# Patient Record
Sex: Female | Born: 1950
Health system: Southern US, Community
[De-identification: ages and names within clinical notes are randomized; demographics above are authoritative.]

## PROBLEM LIST (undated history)

## (undated) DIAGNOSIS — F40240 Claustrophobia: Secondary | ICD-10-CM

## (undated) DIAGNOSIS — R945 Abnormal results of liver function studies: Secondary | ICD-10-CM

## (undated) DIAGNOSIS — C801 Malignant (primary) neoplasm, unspecified: Secondary | ICD-10-CM

## (undated) DIAGNOSIS — K2970 Gastritis, unspecified, without bleeding: Secondary | ICD-10-CM

## (undated) DIAGNOSIS — M8580 Other specified disorders of bone density and structure, unspecified site: Secondary | ICD-10-CM

## (undated) DIAGNOSIS — K5732 Diverticulitis of large intestine without perforation or abscess without bleeding: Secondary | ICD-10-CM

## (undated) DIAGNOSIS — K449 Diaphragmatic hernia without obstruction or gangrene: Secondary | ICD-10-CM

## (undated) DIAGNOSIS — R3915 Urgency of urination: Secondary | ICD-10-CM

## (undated) DIAGNOSIS — K219 Gastro-esophageal reflux disease without esophagitis: Secondary | ICD-10-CM

## (undated) DIAGNOSIS — D494 Neoplasm of unspecified behavior of bladder: Secondary | ICD-10-CM

## (undated) HISTORY — DX: Neoplasm of unspecified behavior of bladder: D49.4

## (undated) HISTORY — PX: ABDOMINAL HYSTERECTOMY: SHX81

## (undated) HISTORY — DX: Diaphragmatic hernia without obstruction or gangrene: K44.9

## (undated) HISTORY — DX: Malignant (primary) neoplasm, unspecified: C80.1

## (undated) HISTORY — DX: Diverticulitis of large intestine without perforation or abscess without bleeding: K57.32

## (undated) HISTORY — DX: Gastritis, unspecified, without bleeding: K29.70

## (undated) HISTORY — DX: Other specified disorders of bone density and structure, unspecified site: M85.80

## (undated) HISTORY — PX: ANTERIOR CRUCIATE LIGAMENT REPAIR: SHX115

## (undated) HISTORY — DX: Abnormal results of liver function studies: R94.5

---

## 1990-05-17 HISTORY — PX: BLADDER TUMOR EXCISION: SHX238

## 1998-03-31 ENCOUNTER — Ambulatory Visit (HOSPITAL_COMMUNITY): Admission: RE | Admit: 1998-03-31 | Discharge: 1998-03-31 | Payer: Self-pay | Admitting: Obstetrics and Gynecology

## 1998-04-02 ENCOUNTER — Other Ambulatory Visit: Admission: RE | Admit: 1998-04-02 | Discharge: 1998-04-02 | Payer: Self-pay | Admitting: Obstetrics and Gynecology

## 1999-12-03 ENCOUNTER — Other Ambulatory Visit: Admission: RE | Admit: 1999-12-03 | Discharge: 1999-12-03 | Payer: Self-pay | Admitting: *Deleted

## 1999-12-08 ENCOUNTER — Encounter: Payer: Self-pay | Admitting: Obstetrics and Gynecology

## 1999-12-08 ENCOUNTER — Encounter: Admission: RE | Admit: 1999-12-08 | Discharge: 1999-12-08 | Payer: Self-pay | Admitting: Obstetrics and Gynecology

## 2000-12-06 ENCOUNTER — Other Ambulatory Visit: Admission: RE | Admit: 2000-12-06 | Discharge: 2000-12-06 | Payer: Self-pay | Admitting: *Deleted

## 2000-12-09 ENCOUNTER — Encounter: Payer: Self-pay | Admitting: Obstetrics and Gynecology

## 2000-12-09 ENCOUNTER — Encounter: Admission: RE | Admit: 2000-12-09 | Discharge: 2000-12-09 | Payer: Self-pay | Admitting: Physical Therapy

## 2002-03-08 ENCOUNTER — Other Ambulatory Visit: Admission: RE | Admit: 2002-03-08 | Discharge: 2002-03-08 | Payer: Self-pay | Admitting: Obstetrics and Gynecology

## 2002-03-29 ENCOUNTER — Encounter: Payer: Self-pay | Admitting: Obstetrics and Gynecology

## 2002-03-29 ENCOUNTER — Encounter: Admission: RE | Admit: 2002-03-29 | Discharge: 2002-03-29 | Payer: Self-pay | Admitting: Obstetrics and Gynecology

## 2003-03-14 ENCOUNTER — Other Ambulatory Visit: Admission: RE | Admit: 2003-03-14 | Discharge: 2003-03-14 | Payer: Self-pay | Admitting: Obstetrics and Gynecology

## 2003-04-03 ENCOUNTER — Encounter: Admission: RE | Admit: 2003-04-03 | Discharge: 2003-04-03 | Payer: Self-pay | Admitting: Obstetrics and Gynecology

## 2004-04-14 ENCOUNTER — Encounter: Admission: RE | Admit: 2004-04-14 | Discharge: 2004-04-14 | Payer: Self-pay | Admitting: Obstetrics and Gynecology

## 2004-06-15 ENCOUNTER — Encounter (INDEPENDENT_AMBULATORY_CARE_PROVIDER_SITE_OTHER): Payer: Self-pay | Admitting: *Deleted

## 2004-06-15 ENCOUNTER — Inpatient Hospital Stay (HOSPITAL_COMMUNITY): Admission: RE | Admit: 2004-06-15 | Discharge: 2004-06-17 | Payer: Self-pay | Admitting: Obstetrics and Gynecology

## 2004-06-15 HISTORY — PX: TOTAL ABDOMINAL HYSTERECTOMY W/ BILATERAL SALPINGOOPHORECTOMY: SHX83

## 2005-08-02 ENCOUNTER — Other Ambulatory Visit: Admission: RE | Admit: 2005-08-02 | Discharge: 2005-08-02 | Payer: Self-pay | Admitting: Obstetrics & Gynecology

## 2005-08-24 LAB — HM COLONOSCOPY

## 2005-12-25 ENCOUNTER — Emergency Department (HOSPITAL_COMMUNITY): Admission: EM | Admit: 2005-12-25 | Discharge: 2005-12-25 | Payer: Self-pay | Admitting: Emergency Medicine

## 2006-08-29 ENCOUNTER — Encounter: Admission: RE | Admit: 2006-08-29 | Discharge: 2006-08-29 | Payer: Self-pay | Admitting: Obstetrics and Gynecology

## 2007-11-14 ENCOUNTER — Encounter: Admission: RE | Admit: 2007-11-14 | Discharge: 2007-11-14 | Payer: Self-pay | Admitting: Obstetrics and Gynecology

## 2008-09-25 ENCOUNTER — Ambulatory Visit (HOSPITAL_BASED_OUTPATIENT_CLINIC_OR_DEPARTMENT_OTHER): Admission: RE | Admit: 2008-09-25 | Discharge: 2008-09-26 | Payer: Self-pay | Admitting: Orthopedic Surgery

## 2008-11-28 ENCOUNTER — Encounter: Admission: RE | Admit: 2008-11-28 | Discharge: 2008-11-28 | Payer: Self-pay | Admitting: Obstetrics and Gynecology

## 2010-09-29 NOTE — Op Note (Signed)
[name], [name]             ACCOUNT NO.:  [id number]   MEDICAL RECORD NO.:  [id number]          PATIENT TYPE:  AMB   LOCATION:  DSC                          FACILITY:  [facility]   PHYSICIAN:  Loreta Ave, M.D. DATE OF BIRTH:  12/08/1950   DATE OF PROCEDURE:  09/25/2008  DATE OF DISCHARGE:                               OPERATIVE REPORT   PREOPERATIVE DIAGNOSES:  Right knee anterior cruciate ligament tear with  anterolateral rotary instability.  Medial meniscus tear.   POSTOPERATIVE DIAGNOSES:  Right knee anterior cruciate ligament tear  with anterolateral rotary instability.  Medial meniscus tear.  Also an  acute osteochondral defect 1 x 1 cm in the middle of the weightbearing  dome of medial femoral condyle with 2 large osteochondral loose bodies  found posterolaterally.  Also, some grade 2 and mild grade 3  chondromalacia patellofemoral joint, both patella and trochlea.   PROCEDURE:  Right knee exam under anesthesia and arthroscopy.  Removal  of loose bodies.  Debridement and microfracturing of medial femoral  condyle.  Partial medial meniscectomy.  Arthroscopic and endoscopic ACL  reconstruction, patellar tendon allograft, bone-tendon-bone with  notchplasty and bioabsorbable screw fixation.   SURGEON:  Loreta Ave, MD   ASSISTANT:  Alyssa Kief, PA, present throughout the entire case and  necessary for timely completion of procedure.   ANESTHESIA:  General.   BLOOD LOSS:  Minimal.   TOURNIQUET TIME:  1 hour 10 minutes.   SPECIMENS:  None.   CULTURES:  None.   COMPLICATIONS:  None.   DRESSINGS:  Soft compressive with knee immobilizer.   PROCEDURE:  The patient was brought to the operating room and placed on  operating table in supine position.  After adequate anesthesia had been  obtained, right knee examined.  Positive Lachman, positive drawer,  positive pivot shift.  Posterior cruciate ligament, collaterals,  patellofemoral joint stable.  Tourniquet  applied.  Prepped and draped in  usual sterile fashion.  Exsanguinated with elevation and Esmarch.  Tourniquet inflated to 350 mmHg.  Three portals created, one  superolateral, one each medial and lateral parapatellar.  Inflow  catheter introduced.  The knee distended, arthroscope introduced and  knee inspected.  Good patellofemoral tracking.  Diffuse grade 2 and mild  grade 3 changes of patella and trochlea debrided.  No tethering.  Complete mid substance ACL tear debrided.  A V-shaped narrow notch  opened with notchplasty with shaver and bur.  Lateral meniscus and  lateral compartment normal.  Medial compartment had a full-thickness  osteochondral defect 1 x 1 cm right middle of the weightbearing dome in  full extension.  Debrided back to sharp margin.  The osteochondral  fragments from this, 2 large ones were found posterolaterally and milked  down the popliteal tendon sheath and behind lateral femoral condyle and  removed.  Numerous other small loose bodies removed.  Medial meniscus  had a previous partial medial meniscectomy, but there was a new large  flap tear in the middle third tearing into what was left of the  posterior third.  Taken out to stable rim, tapered into remaining  meniscus,  and release the anterior half.  The denuded condyle was then  treated with multiple microfracturing, reduction of intra-articular  pressure and some bleeding from that area confirmed.  Graft prepared for  10-mm tunnels, bone-tendon-bone with a patellar tendon allograft.  With  the arthroscope in place, an incision next to tibial tubercle.  Using  appropriate guides, tunnel created there onto the footprint of the ACL  on the tibia.  Femoral tunnel created with a femoral guide through the  tibial tunnel and off the back cortex femur.  Both tunnels were placed  through guidewire and then overdrilled with 10-mm reamer.  Tunnels  assessed and found be in good position.  Debris cleared throughout the   knee.  Tubing passer inserted across both tunnels and out through a stab  wound in anterolateral thigh.  Nitinol wire brought to medial portal and  out through the femoral tunnel.  Graft attached to the 2-pin passer,  pulled in across the knee seating the graft well in both tunnels.  Over  nitinol wire and with appropriate tensioning technique, the graft was  firmly fixed in both tunnels with bioabsorbable 9 x 25-mm bioabsorbable  screws.  At completion, excellent stable fixation of the graft above and  below.  Good clearance through full motion.  Negative Lachman, negative  drawer, and significant improvement in stability.  All nitinol wire and  sutures had been removed.  Wounds irrigated.  Closed with subcutaneous  and subcuticular Vicryl.  Portals all closed with nylon.  Sterile  compressive dressing applied.  Tourniquet deflated and removed.  Knee  immobilizer applied.  Anesthesia reversed.  Brought to the room.  Tolerated surgery well.  No complications.      Loreta Ave, M.D.  Electronically Signed     DFM/MEDQ  D:  09/25/2008  T:  09/26/2008  Job:  [id number]

## 2010-10-02 NOTE — Op Note (Signed)
[name], [name]             ACCOUNT NO.:  [id number]   MEDICAL RECORD NO.:  [id number]          PATIENT TYPE:  INP   LOCATION:  9399                          FACILITY:  [facility]   PHYSICIAN:  Cynthia P. Romine, M.D.DATE OF BIRTH:  04-06-1951   DATE OF PROCEDURE:  06/15/2004  DATE OF DISCHARGE:                                 OPERATIVE REPORT   PREOPERATIVE DIAGNOSIS:  Dysfunctional uterine bleeding, known endometrial  polyp and 6 cm left ovarian mass, presumed dermoid.   POSTOPERATIVE DIAGNOSIS:  Dysfunctional uterine bleeding, known endometrial  polyp and 6 cm left ovarian mass, presumed dermoid.  Pathology pending.  Frozen section on left ovarian mass confirmed dermoid.   OPERATION PERFORMED:  Total abdominal hysterectomy and bilateral salpingo-  oophorectomy.   SURGEON:  Cynthia P. Romine, M.D.   ASSISTANT:  Andres Ege, M.D.   ANESTHESIA:  General endotracheal.   ESTIMATED BLOOD LOSS:  100 mL.   COMPLICATIONS:  None.   DESCRIPTION OF PROCEDURE:  The patient was taken to the operating room and  after induction of adequate general endotracheal anesthesia, was placed in  supine frog leg position and prepped and draped in the usual fashion.  A  Foley catheter was inserted.  The patient was then placed supine. A  Pfannenstiel incision was made and carried down to the fascia with the  Bovie.  The fascia was nicked and opened transversely.  The underlying  rectus muscles were separated sharply in the midline.  Underlying peritoneum  was entered atraumatically, opened vertically with Metzenbaum scissors.  Palpation of the abdomen revealed the liver edge to be smooth.  The  gallbladder was tense but contained no stones.  There was no pelvic or  periaortic adenopathy.  In the pelvis, the uterus was small and normal  shape.  The right tube and ovary appeared normal.  The left tube appeared  normal but on the left ovary, it was enlarged to 6 cm with a smooth cyst.  The  round ligament on either side were sutured and divided with Bovie.  The  anterior leaf of the broad ligament was taken down.  A window was made in  the mesosalpinx and the pedicle containing the infundibulopelvic ligament on  each side was clamped, cut and tied bilaterally after identifying the  ureter.  The pedicle containing the tube and the utero-ovarian ligament was  clamped and cut on the left and the specimen was sent to pathology for  frozen section.  Frozen section diagnosis came back showing an ovarian  mature cystic teratoma. The posterior leaf of the peritoneum was taken down  sharply.  The uterine arteries skeletonized.  The bladder was taken down  sharply.  The uterine arteries were clamped, cut and doubly tied on each  side.  The hysterectomy contained down the cardial ligament with straight  Heaney clamps, clamping, cutting and tying in sequence.  Uterosacral  ligaments were taken in separate pedicles using curved Heaneys.  These  pedicles were held.  The uterus was entered on the patient's right during  the cutting of the uterosacral ligament pedicle.  The specimen was  removed  with Mayo scissors.  The vaginal margins were grasped with Kochers, angle  sutures were placed at each of the right and left angles of the vagina.  The  vagina was closed with interrupted figure-of-eight sutures of 0 chromic.  Good hemostasis was noted.  There was an area of bleeding at the left angle  that was easily controlled with a single figure-of-eight suture of 0 chromic  at the left angle of the vagina.  A small amount of bleeding under the  bladder peritoneum that was controlled with minimal Bovie.  The pelvis was  inspected and was felt to be free of bleeding.  The bowel was allowed to  return to  its anatomic position.  The peritoneum was closed in running  fashion, using 2-0 chromic and On-Q catheter was placed in the subfascial  space and then the fascia was closed with 3-0 Vicryl going  from each angle  towards the midline.  An On-Q catheter was placed in the subcu space,  hemostasis was achieved with Bovie.  The subcu was irrigated and the skin  was closed with staples.  The subfascial space was then injected with 10 mL  of 1% Xylocaine.  The subcutaneous space was injected with 5 mL of 1%  Xylocaine and the procedure was terminated.  The patient tolerated the  procedure well, went in satisfactory condition to post anesthesia recovery.  Sponge, needle and instrument counts were correct times three.      CPR/MEDQ  D:  06/15/2004  T:  06/15/2004  Job:  [id number]

## 2010-10-02 NOTE — Discharge Summary (Signed)
[name], [name]             ACCOUNT NO.:  [id number]   MEDICAL RECORD NO.:  [id number]          PATIENT TYPE:  INP   LOCATION:  9304                          FACILITY:  [facility]   PHYSICIAN:  Cynthia P. Romine, M.D.DATE OF BIRTH:  February 06, 1951   DATE OF ADMISSION:  06/15/2004  DATE OF DISCHARGE:  06/17/2004                                 DISCHARGE SUMMARY   DISCHARGE DIAGNOSES:  1.  Endometrial polyp.  2.  Adenomyosis.  3.  Multiple leiomyoma.   HISTORY:  This is a 60 year old married white female, gravida 3, para 3 who  had dysfunctional uterine bleeding.  Endometrial biopsy had showed fragments  of benign polyps, sonohysterogram showed a 1.4 cm endometrial polyp and a  6.6 cm complex mass on the left ovary consistent with a dermoid. A CA 125  was 13.  She was admitted for a total abdominal hysterectomy and bilateral  salpingo-oophorectomy.  On June 15, 2004, she underwent a TAH, BSO  without difficulty.  The left tube was normal, the left ovary had a 6 cm  smooth cyst on it that was easily removed.  Final diagnosis showed a mature  cystic teratoma of 5.5 cm and a benign paratubal cyst.  Postoperatively, the  patient had an uneventful recovery and was sent home on postoperative day  #2, afebrile and in good condition. She was sent home for Vivelle dot 0.05  mg to use two times per week and Percocet 5 mg #15 one p.o. q.4 hours p.r.n.  pain. She was given full discharge instructions regarding pelvic rest and  followup in the office.   LABORATORY DATA:  Admission H&H was 14 and 41.  On discharge 11.5 and 33.  Pregnancy test was negative.      CPR/MEDQ  D:  07/09/2004  T:  07/09/2004  Job:  [id number]

## 2010-12-01 ENCOUNTER — Other Ambulatory Visit: Payer: Self-pay | Admitting: Obstetrics and Gynecology

## 2010-12-01 DIAGNOSIS — Z1231 Encounter for screening mammogram for malignant neoplasm of breast: Secondary | ICD-10-CM

## 2010-12-10 ENCOUNTER — Ambulatory Visit
Admission: RE | Admit: 2010-12-10 | Discharge: 2010-12-10 | Disposition: A | Discharge status: Home / Self Care | Payer: 59 | Source: Ambulatory Visit | Attending: Obstetrics and Gynecology | Admitting: Obstetrics and Gynecology

## 2010-12-10 DIAGNOSIS — Z1231 Encounter for screening mammogram for malignant neoplasm of breast: Secondary | ICD-10-CM

## 2012-01-11 ENCOUNTER — Other Ambulatory Visit: Payer: Self-pay | Admitting: Obstetrics and Gynecology

## 2012-01-11 DIAGNOSIS — Z1231 Encounter for screening mammogram for malignant neoplasm of breast: Secondary | ICD-10-CM

## 2012-01-13 ENCOUNTER — Ambulatory Visit
Admission: RE | Admit: 2012-01-13 | Discharge: 2012-01-13 | Disposition: A | Discharge status: Home / Self Care | Payer: 59 | Source: Ambulatory Visit | Attending: Obstetrics and Gynecology | Admitting: Obstetrics and Gynecology

## 2012-01-13 DIAGNOSIS — Z1231 Encounter for screening mammogram for malignant neoplasm of breast: Secondary | ICD-10-CM

## 2012-10-04 ENCOUNTER — Other Ambulatory Visit: Payer: Self-pay | Admitting: Dermatology

## 2012-11-14 ENCOUNTER — Other Ambulatory Visit: Payer: Self-pay | Admitting: Dermatology

## 2012-12-26 ENCOUNTER — Other Ambulatory Visit: Payer: Self-pay | Admitting: Nurse Practitioner

## 2012-12-27 NOTE — Telephone Encounter (Signed)
eScribe request for refill on MINIVELLE Last filled - 06/20/12 until next AEX Last AEX - 01/24/12 Next AEX - 01/19/13 One month supply sent until AEX.

## 2013-01-19 ENCOUNTER — Other Ambulatory Visit: Payer: Self-pay

## 2013-01-19 DIAGNOSIS — Z1231 Encounter for screening mammogram for malignant neoplasm of breast: Secondary | ICD-10-CM

## 2013-01-21 ENCOUNTER — Other Ambulatory Visit: Payer: Self-pay | Admitting: Nurse Practitioner

## 2013-01-29 ENCOUNTER — Ambulatory Visit: Payer: Self-pay | Admitting: Nurse Practitioner

## 2013-01-29 ENCOUNTER — Ambulatory Visit (INDEPENDENT_AMBULATORY_CARE_PROVIDER_SITE_OTHER): Payer: 59 | Admitting: Nurse Practitioner

## 2013-01-29 ENCOUNTER — Encounter: Payer: Self-pay | Admitting: Nurse Practitioner

## 2013-01-29 VITALS — BP 136/90 | HR 72 | Resp 16 | Ht 63.0 in | Wt 167.0 lb

## 2013-01-29 DIAGNOSIS — E559 Vitamin D deficiency, unspecified: Secondary | ICD-10-CM

## 2013-01-29 DIAGNOSIS — Z0000 Encounter for general adult medical examination without abnormal findings: Secondary | ICD-10-CM

## 2013-01-29 DIAGNOSIS — Z01419 Encounter for gynecological examination (general) (routine) without abnormal findings: Secondary | ICD-10-CM

## 2013-01-29 LAB — POCT URINALYSIS DIPSTICK
Blood, UA: NEGATIVE
Ketones, UA: NEGATIVE
Nitrite, UA: NEGATIVE
Urobilinogen, UA: NEGATIVE

## 2013-01-29 MED ORDER — ESTRADIOL 0.0375 MG/24HR TD PTTW: 1.0000 | MEDICATED_PATCH | TRANSDERMAL | Status: DC

## 2013-01-29 NOTE — Progress Notes (Signed)
Patient ID: [name], female   DOB: June 19, 1950, 62 y.o.   MRN: [id number] 62 y.o. [id number] Married Caucasian Fe here for annual exam.   feels well without any problems. Continues to play tennis.  No LMP recorded. Patient has had a hysterectomy.          Sexually active: yes  The current method of family planning is status post hysterectomy.    Exercising: yes  tennis and walking Smoker:  no  Health Maintenance: Pap:  08/02/05, WNL MMG:  01/13/12, BI-Rads 1: negative Colonoscopy:  08/24/05, normal, repeat in 10 years  BMD:   08/29/06 normal TDaP:  3/08 Labs: HB: 14.9 Urine: negative     Past Medical History  Diagnosis Date  . Benign bladder tumor 1992    Past Surgical History  Procedure Laterality Date  . Total abdominal hysterectomy w/ bilateral salpingoophorectomy  1/06    Current Outpatient Prescriptions  Medication Sig Dispense Refill  . MINIVELLE 0.05 MG/24HR patch APPLY ONE PATCH TWICE A WEEK  24 patch  0   No current facility-administered medications for this visit.    No family history on file.  ROS:  Pertinent items are noted in HPI.  Otherwise, a comprehensive ROS was negative.  Exam:   BP 136/90  Pulse 72  Resp 16  Ht 5\' 3"  (1.6 m)  Wt 167 lb (75.751 kg)  BMI 29.59 kg/m2 Height: 5\' 3"  (160 cm)  Ht Readings from Last 3 Encounters:  01/29/13 5\' 3"  (1.6 m)    General appearance: alert, cooperative and appears stated age Head: Normocephalic, without obvious abnormality, atraumatic Neck: no adenopathy, supple, symmetrical, trachea midline and thyroid normal to inspection and palpation Lungs: clear to auscultation bilaterally Breasts: normal appearance, no masses or tenderness Heart: regular rate and rhythm Abdomen: soft, non-tender; no masses,  no organomegaly Extremities: extremities normal, atraumatic, no cyanosis or edema Skin: Skin color, texture, turgor normal. No rashes or lesions Lymph nodes: Cervical, supraclavicular, and axillary nodes  normal. No abnormal inguinal nodes palpated Neurologic: Grossly normal   Pelvic: External genitalia:  no lesions              Urethra:  normal appearing urethra with no masses, tenderness or lesions              Bartholin's and Skene's: normal                 Vagina: normal appearing vagina with normal color and discharge, no lesions              Cervix: absent              Pap taken: no Bimanual Exam:  Uterus:  uterus absent              Adnexa: no mass, fullness, tenderness               Rectovaginal: Confirms               Anus:  normal sphincter tone, no lesions  A:  Well Woman with normal exam  S/P TAH / BSO 06/15/04 secondary to AUB and dermoid with ERT since then  Vit D deficiency  P:   Pap smear as per guidelines   Will try decrease in Minivelle patch if not tolerated to CB 0.375 mg 2 times weekly for a year.  Mammogram scheduled for next week  Discussed potential risk of ERT with DVT, CVA, cancer, etc  Will follow with Vit D level  may be able to go to OTC dosing, no refill is given today.  Counseled on breast self exam, adequate intake of calcium and vitamin D, diet and exercise, Kegel's exercises return annually or prn  An After Visit Summary was printed and given to the patient.

## 2013-01-29 NOTE — Patient Instructions (Signed)

## 2013-01-30 LAB — HEMOGLOBIN, FINGERSTICK: Hemoglobin, fingerstick: 14.9 g/dL (ref 12.0–16.0)

## 2013-01-31 ENCOUNTER — Telehealth: Payer: Self-pay | Admitting: *Deleted

## 2013-01-31 NOTE — Telephone Encounter (Signed)
Message copied by Luisa Dago on Wed Jan 31, 2013 10:27 AM ------      Message from: Ria Comment R      Created: Tue Jan 30, 2013  3:44 PM       She can now do OTC Vit D at 1000 IU daily.  I did not give refill on RX Vit D. ------

## 2013-01-31 NOTE — Telephone Encounter (Signed)
I have attempted to contact this patient by phone with the following results: left message to return my call on answering machine (home).  

## 2013-02-02 NOTE — Telephone Encounter (Signed)
Pt notified of labs

## 2013-02-04 NOTE — Progress Notes (Signed)
Encounter reviewed by Dr. Brook Silva.  

## 2013-02-08 MED ORDER — ESTRADIOL 0.0375 MG/24HR TD PTTW: 1.0000 | MEDICATED_PATCH | TRANSDERMAL | Status: DC

## 2013-02-08 NOTE — Addendum Note (Signed)
Addended by: Roanna Banning on: 02/08/2013 08:32 AM   Modules accepted: Orders

## 2013-02-09 ENCOUNTER — Ambulatory Visit
Admission: RE | Admit: 2013-02-09 | Discharge: 2013-02-09 | Disposition: A | Discharge status: Home / Self Care | Payer: 59 | Source: Ambulatory Visit

## 2013-02-09 DIAGNOSIS — Z1231 Encounter for screening mammogram for malignant neoplasm of breast: Secondary | ICD-10-CM

## 2013-03-21 ENCOUNTER — Ambulatory Visit
Admission: RE | Admit: 2013-03-21 | Discharge: 2013-03-21 | Disposition: A | Discharge status: Home / Self Care | Payer: 59 | Source: Ambulatory Visit | Attending: Nurse Practitioner | Admitting: Nurse Practitioner

## 2013-03-21 DIAGNOSIS — Z01419 Encounter for gynecological examination (general) (routine) without abnormal findings: Secondary | ICD-10-CM

## 2013-04-02 ENCOUNTER — Telehealth: Payer: Self-pay | Admitting: *Deleted

## 2013-04-02 NOTE — Telephone Encounter (Signed)
I have attempted to contact this patient by phone with the following results: left message to return my call on answering machine (home).  

## 2013-04-02 NOTE — Telephone Encounter (Signed)
Message copied by Luisa Dago on Mon Apr 02, 2013  3:40 PM ------      Message from: Ria Comment R      Created: Wed Mar 28, 2013  1:21 PM       Let patienn know that BMD shows no significant changes from 12/2007.  Still in the low normal range at left hip site.  She must do exercise, calcium and Vit D.  Recheck in 2 years. ------

## 2013-04-03 NOTE — Telephone Encounter (Signed)
Patient returning Stephanie's call.

## 2013-04-04 NOTE — Telephone Encounter (Signed)
Pt returned call on 04/03/13 when I was out of the office.  I have attempted to contact this patient by phone with the following results: left message to return my call on answering machine (home).

## 2013-04-04 NOTE — Telephone Encounter (Signed)
Pt notified of BMD results.  Pt voices understanding and is agreeable with plan.  Pt is interested in seeing Dr. Edward Jolly for bladder issues/urodynamics.  I was unsure of process for this.  Please advise.  Thanks.

## 2013-04-09 NOTE — Telephone Encounter (Signed)
Spoke with pt about PG advice to see Dr. Edward Jolly about bladder issues. Consult appt made for 04-27-13 at 11:30.

## 2013-04-09 NOTE — Telephone Encounter (Signed)
Please call and schedule consult appointment with Dr. Edward Jolly.  She then will decide if uro-dynamics is needed.

## 2013-04-09 NOTE — Telephone Encounter (Signed)
LMTCB about appt with Dr. Edward Jolly.  aa   (Was going to offer 04-19-13 at 7 am, or 04-27-13 at 11:30 am.)

## 2013-04-25 ENCOUNTER — Encounter: Payer: Self-pay | Admitting: *Deleted

## 2013-04-27 ENCOUNTER — Ambulatory Visit (INDEPENDENT_AMBULATORY_CARE_PROVIDER_SITE_OTHER): Payer: 59 | Admitting: Obstetrics and Gynecology

## 2013-04-27 ENCOUNTER — Encounter: Payer: Self-pay | Admitting: Obstetrics and Gynecology

## 2013-04-27 VITALS — BP 138/80 | HR 76 | Resp 16 | Wt 170.0 lb

## 2013-04-27 DIAGNOSIS — N993 Prolapse of vaginal vault after hysterectomy: Secondary | ICD-10-CM

## 2013-04-27 DIAGNOSIS — N816 Rectocele: Secondary | ICD-10-CM

## 2013-04-27 DIAGNOSIS — IMO0002 Reserved for concepts with insufficient information to code with codable children: Secondary | ICD-10-CM

## 2013-04-27 DIAGNOSIS — N8111 Cystocele, midline: Secondary | ICD-10-CM

## 2013-04-27 DIAGNOSIS — N3946 Mixed incontinence: Secondary | ICD-10-CM

## 2013-04-27 MED ORDER — FESOTERODINE FUMARATE ER 4 MG PO TB24: 4.0000 mg | ORAL_TABLET | Freq: Every day | ORAL | Status: DC

## 2013-04-27 NOTE — Patient Instructions (Addendum)
Please refer to your handouts on Pelvic Organ Prolapse and Urinary Incontinence.  Fesoterodine extended-release tablets What is this medicine? FESOTERODINE (fes oh TER oh deen) is used to treat overactive bladder. This medicine reduces the amount of bathroom visits. This medicine may be used for other purposes; ask your health care provider or pharmacist if you have questions. COMMON BRAND NAME(S): Gala Murdoch What should I tell my health care provider before I take this medicine? They need to know if you have any of these conditions: -difficulty passing urine -glaucoma -intestinal obstruction -kidney disease -liver disease -an unusual or allergic reaction to fesoterodine, tolterodine, other medicines, foods, dyes, or preservatives -pregnant or trying to get pregnant -breast-feeding How should I use this medicine? Take this medicine by mouth with a glass of water. Follow the directions on the prescription label. Do not cut, crush or chew this medicine. Take your doses at regular intervals. Do not take your medicine more often than directed. Talk to your pediatrician regarding the use of this medicine in children. Special care may be needed. Overdosage: If you think you have taken too much of this medicine contact a poison control center or emergency room at once. NOTE: This medicine is only for you. Do not share this medicine with others. What if I miss a dose? If you miss a dose, take it as soon as you can. If it is almost time for your next dose, take only that dose. Do not take double or extra doses. What may interact with this medicine? -antihistamines for allergy, cough and cold -atropine -certain medicines for bladder problems like oxybutynin, tolterodine -certain medicines for Parkinson's disease like benztropine, trihexyphenidyl -certain medicines for stomach problems like dicyclomine, hyoscyamine -certain medicines for travel sickness like  scopolamine -clarithromycin -ipratropium -itraconazole -ketoconazole -rifampin This list may not describe all possible interactions. Give your health care provider a list of all the medicines, herbs, non-prescription drugs, or dietary supplements you use. Also tell them if you smoke, drink alcohol, or use illegal drugs. Some items may interact with your medicine. What should I watch for while using this medicine? It may take 2 or 3 months to notice the full benefit from this medicine. Your health care professional may also recommend techniques that may help improve control of your bladder and sphincter muscles. These techniques will help you need the bathroom less frequently. You may need to limit your intake of tea, coffee, caffeinated sodas, and alcohol. These drinks may make your symptoms worse. Keeping healthy bowel habits may lessen bladder symptoms. If you currently smoke, quitting smoking may help reduce irritation to the bladder muscle. You may get drowsy or dizzy. Do not drive, use machinery, or do anything that needs mental alertness until you know how this drug affects you. Do not stand or sit up quickly, especially if you are an older patient. This reduces the risk of dizzy or fainting spells. Your mouth may get dry. Chewing sugarless gum or sucking hard candy and drinking plenty of water will help. This medicine may cause dry eyes and blurred vision. If you wear contact lenses you may feel some discomfort. Lubricating drops may help. See your eye doctor if the problem does not go away or is severe. What side effects may I notice from receiving this medicine? Side effects that you should report to your doctor or health care professional as soon as possible: -allergic reactions like skin rash, itching or hives, swelling of the face, lips, or tongue -breathing problems -chest pain -fast, irregular  heartbeat -fever -swelling of the ankles, feet, hands -trouble passing urine or change in  the amount of urine Side effects that usually do not require medical attention (report to your doctor or health care professional if they continue or are bothersome): -changes in vision -constipation -dizziness -dry eyes or mouth -nausea -stomach upset -tiredness This list may not describe all possible side effects. Call your doctor for medical advice about side effects. You may report side effects to FDA at 1-800-FDA-1088. Where should I keep my medicine? Keep out of the reach of children. Store at room temperature between 15 and 30 degrees C (59 and 86 degrees F). Protect from moisture. Throw away any unused medicine after the expiration date. NOTE: This sheet is a summary. It may not cover all possible information. If you have questions about this medicine, talk to your doctor, pharmacist, or health care provider.  2014, [employer] Standard. (2009-07-02 12:25:12)

## 2013-04-27 NOTE — Progress Notes (Signed)
Patient ID: [name], female   DOB: November 17, 1950, 62 y.o.   MRN: [id number]  GYNECOLOGY PROBLEM VISIT  PCP:   Referring provider:   HPI: 62 y.o.   Married  Caucasian  female   [phone] with No LMP recorded. Patient has had a hysterectomy.   here for bladder problems. Incontinence all the time. Wears a pad when does sports. Leakage of urine is what bothers the patient the most.  Leaks with bending and playing tennis and coughing.  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  DF - depends on water intake.  Can be up to every 20 - 30 minutes. NF - Twice a night.  No enuresis.  Voids well.  No dysuria or hematuria. Thinks she has a history of a bladder polyp removal many years ago. No history of stones.  No fecal incontinence. Presses vaginally occasionally to have bowel movements.  Has had physical therapy in past and it did not help. No medication use to date.  No history of glaucoma or cardiac arrhythmia.    GYNECOLOGIC HISTORY: No LMP recorded. Patient has had a hysterectomy. Sexually active:  Yes.  Partner preference: Female. Contraception:  NA Menopausal hormone therapy:  [place]. DES exposure:  No. Blood transfusions:   No. Sexually transmitted diseases:  No. GYN Procedures:  TAH/BSO in 2006 for a dermoid. Mammogram:  September 2014 - WNL.          Pap:   History of abnormal pap smear:  One in remote past.    OB History   Grav Para Term Preterm Abortions TAB SAB Ect Mult Living   3 3 3       3          Family History  Problem Relation Age of Onset  . Cancer Maternal Grandfather     There are no active problems to display for this patient.   Past Medical History  Diagnosis Date  . Bladder tumor 1992    BENIGN  . Fibroid     Past Surgical History  Procedure Laterality Date  . Anterior cruciate ligament repair Right   . Bladder tumor excision  1992    benign  . Total abdominal hysterectomy w/ bilateral salpingoophorectomy  06/15/04    dermoid and AUB  .  Abdominal hysterectomy      ALLERGIES: Sulfa antibiotics  Current Outpatient Prescriptions  Medication Sig Dispense Refill  . estradiol (MINIVELLE) 0.0375 MG/24HR Place 1 patch onto the skin 2 (two) times a week. Apply anywhere on lower abdomen.  24 patch  3  . Multiple Vitamin (MULTIVITAMIN) tablet Take 1 tablet by mouth daily.      . Vitamin D, Ergocalciferol, (DRISDOL) 50000 UNITS CAPS capsule Take 1 capsule by mouth every 14 (fourteen) days.       No current facility-administered medications for this visit.     ROS:  Pertinent items are noted in HPI.  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   PHYSICAL EXAMINATION:    BP 138/80  Pulse 76  Resp 16  Wt 170 lb (77.111 kg)   Wt Readings from Last 3 Encounters:  04/27/13 170 lb (77.111 kg)  01/29/13 167 lb (75.751 kg)     Ht Readings from Last 3 Encounters:  01/29/13 5\' 3"  (1.6 m)    General appearance: alert, cooperative and appears stated age Head: Normocephalic, without obvious abnormality, atraumatic Abdomen: Pfannenstiel incision, soft, non-tender; no masses,  no organomegaly. No abnormal inguinal nodes palpated Neurologic: Grossly normal  Pelvic: External genitalia:  no lesions              Urethra:  normal appearing urethra with no masses, tenderness or lesions              Bartholins and Skenes: normal                 Vagina: normal appearing vagina with normal color and discharge, no lesions, second degree cystocele and second degree rectocele, 1+ degree vault prolapse.               Cervix:  absent                Bimanual Exam:  Uterus:   absent                                      Adnexa: normal adnexa in size, nontender and no masses                                      Rectovaginal: Confirms                                      Anus:  normal sphincter tone, no lesions  ASSESSMENT  Incomplete vaginal prolapse with a component of vault prolapse. Mixed urinary  incontinence.  PLAN  I have had a comprehensive discussion with the patient regarding prolapse and urinary incontinence.  I have provided reading materials from ACOG regarding prolapse and incontinence in general as well as medical and surgical treatment for these conditions.   We discussed benefits and risks which include but are not limited to bleeding, infection, damage to surrounding organs, ureteral damage, vaginal pain with intercourse, mesh erosion and exposure, dyspareunia, urinary retention and need for prolonged catheterization and/or self catheterization, reoperation, recurrence of prolapse, peripheral neuropathy, hernia formation, DVT, PE, death, and reaction to anesthesia.    I am recommending patient consider an abdominal sacrocolpopexy with Halban's culdoplasty, anterior and posterior colporrhaphy, TVT Exact with cystoscopy.  I have also discussed a vaginal approach with an anterior and posterior colporrhaphy, vaginal vault suspension with possible xenform graft use, TVT Exact and cystoscopy.  She will consider surgery for next summer.  She understands that a pessary is an alternative to surgical care, but she declines this at this time.   Urodynamics will be performed closer to the time of surgery if she proceeds.    Toviaz for overactive bladder.  Benefits and risks discussed. Medications per Epic orders. Follow up in 6 weeks. Call if unable to void.    An After Visit Summary was printed and given to the patient.

## 2013-05-21 ENCOUNTER — Other Ambulatory Visit: Payer: Self-pay | Admitting: Family Medicine

## 2013-05-21 ENCOUNTER — Ambulatory Visit
Admission: RE | Admit: 2013-05-21 | Discharge: 2013-05-21 | Disposition: A | Discharge status: Home / Self Care | Payer: 59 | Source: Ambulatory Visit | Attending: Family Medicine | Admitting: Family Medicine

## 2013-05-21 DIAGNOSIS — R059 Cough, unspecified: Secondary | ICD-10-CM

## 2013-05-21 DIAGNOSIS — R509 Fever, unspecified: Secondary | ICD-10-CM

## 2013-05-21 DIAGNOSIS — R05 Cough: Secondary | ICD-10-CM

## 2013-06-01 ENCOUNTER — Other Ambulatory Visit: Payer: Self-pay | Admitting: Dermatology

## 2013-06-06 ENCOUNTER — Telehealth: Payer: Self-pay | Admitting: Obstetrics and Gynecology

## 2013-06-06 NOTE — Telephone Encounter (Signed)
Patient is not sure if she needs to keep her appointment on Friday 06/08/13. Patient would like to talk to a nurse.

## 2013-06-07 NOTE — Telephone Encounter (Signed)
Dr Quincy Simmonds,  Patient calling, states she has been sick with pneumonia and has not been taking Toviaz. She started taking it regularly on 1/19 now that she is feeling better. She wanted to know if she should keep follow up appointment for Friday or should she move it out further after she has been on toviaz for a longer period of time?

## 2013-06-07 NOTE — Telephone Encounter (Signed)
Please push the appointment out further. I recommend a recheck after the patient is on her medication for at least 6 weeks.

## 2013-06-08 ENCOUNTER — Ambulatory Visit: Payer: 59 | Admitting: Obstetrics and Gynecology

## 2013-06-08 NOTE — Telephone Encounter (Signed)
Appointment cancelled and r/s. Patient aware.

## 2013-07-13 ENCOUNTER — Encounter: Payer: Self-pay | Admitting: Obstetrics and Gynecology

## 2013-07-13 ENCOUNTER — Ambulatory Visit (INDEPENDENT_AMBULATORY_CARE_PROVIDER_SITE_OTHER): Payer: 59 | Admitting: Obstetrics and Gynecology

## 2013-07-13 VITALS — BP 140/86 | HR 64 | Ht 63.0 in | Wt 170.0 lb

## 2013-07-13 DIAGNOSIS — N3946 Mixed incontinence: Secondary | ICD-10-CM

## 2013-07-13 MED ORDER — FESOTERODINE FUMARATE ER 4 MG PO TB24: 4.0000 mg | ORAL_TABLET | Freq: Every day | ORAL | Status: DC

## 2013-07-13 NOTE — Progress Notes (Signed)
Patient ID: [name], female   DOB: [birth year], 63 y.o.   MRN: [id number] PCP - Donnie Coffin  Subjective  Patient is here for a recheck.  63 y.o.   Married  Caucasian  female    [phone] with No LMP recorded. Patient has had a TAH/BSO. here for bladder follow up.   Just had community acquired pneumonia and was treated with Z Pack. Leaked a lot from coughing and now wants to pursue surgery after wedding on June 20th.    Stopped Toviaz temporarily while was doing treatment for pneumonia. Back on second month now. Leaking without warning every once in while. Voiding well and less often. Up only once a night.    Dry mouth and constipation so drinking more water at night.     Thinks she has a history of a bladder polyp removal 1992. No history of stones.  No fecal incontinence. Presses vaginally occasionally to have bowel movements.    Has an appointment with PCP on March 20 for a routine visit.   Objective  No exam.  Assessment  Mixed incontinence.  Responding well to [place]. Incomplete vaginal prolapse. Status post TAH/BSO.   Plan  Continue Toviaz 4 mg daily.  #30.  RF 5.  Will schedule urodynamics.  Discussed with patient. She will stop Toviaz 5 days before testing.  She knows she may need a pessary for reduction of the prolapse for the urodynamics.   I will meet with the patient about one week after the testing to re-examine her, review the urodynamics, and develop a final surgical plan.   25 minutes face to face time of which over 50% was spent in counseling.   After visit summary to patient.

## 2013-07-13 NOTE — Patient Instructions (Signed)
We will call to schedule your urodynamic testing! Stop your Toviaz 5 days before this test.

## 2013-07-16 ENCOUNTER — Telehealth: Payer: Self-pay | Admitting: Obstetrics and Gynecology

## 2013-07-16 NOTE — Telephone Encounter (Signed)
Left message for patient to call back. Need to go over benefits and schedule urinalysis and urodynamics testing.

## 2013-07-18 NOTE — Telephone Encounter (Signed)
Left message for patient to call back  

## 2013-07-18 NOTE — Telephone Encounter (Signed)
Patient returned call. Reschedule urinalysis time on 03.12.2015.

## 2013-07-18 NOTE — Telephone Encounter (Signed)
Pt calling to change appointment scheduled for the urodynamics testing.

## 2013-07-26 ENCOUNTER — Ambulatory Visit (INDEPENDENT_AMBULATORY_CARE_PROVIDER_SITE_OTHER): Payer: 59 | Admitting: *Deleted

## 2013-07-26 ENCOUNTER — Other Ambulatory Visit: Payer: Self-pay | Admitting: Nurse Practitioner

## 2013-07-26 ENCOUNTER — Other Ambulatory Visit: Payer: 59

## 2013-07-26 VITALS — BP 136/86 | HR 75 | Resp 18 | Wt 171.0 lb

## 2013-07-26 DIAGNOSIS — N3946 Mixed incontinence: Secondary | ICD-10-CM

## 2013-07-26 LAB — POCT URINALYSIS DIPSTICK
Bilirubin, UA: NEGATIVE
Blood, UA: NEGATIVE
Glucose, UA: NEGATIVE
KETONES UA: NEGATIVE
Leukocytes, UA: NEGATIVE
Nitrite, UA: NEGATIVE
Protein, UA: NEGATIVE
UROBILINOGEN UA: NEGATIVE
pH, UA: 7

## 2013-07-26 NOTE — Progress Notes (Signed)
Patient in today to check UA for Urodynamics. Pt denies any sx of UTI. No burning, no urgency, no frequency, no discomfort.  -UA clear. No Urine Culture needed.

## 2013-07-27 NOTE — Progress Notes (Addendum)
Encounter and urinalysis reviewed. OK to proceed with urodynamics. Will need to stop Toviaz about 4 - 5 days prior to urodynamics.

## 2013-08-01 ENCOUNTER — Ambulatory Visit (INDEPENDENT_AMBULATORY_CARE_PROVIDER_SITE_OTHER): Payer: 59 | Admitting: Obstetrics and Gynecology

## 2013-08-01 VITALS — BP 140/86 | Wt 170.0 lb

## 2013-08-01 DIAGNOSIS — N3946 Mixed incontinence: Secondary | ICD-10-CM

## 2013-08-03 NOTE — Progress Notes (Signed)
Urodynamic testing performed, see separate procedure report.

## 2013-08-09 ENCOUNTER — Encounter: Payer: Self-pay | Admitting: Obstetrics and Gynecology

## 2013-08-09 ENCOUNTER — Ambulatory Visit (INDEPENDENT_AMBULATORY_CARE_PROVIDER_SITE_OTHER): Payer: 59 | Admitting: Obstetrics and Gynecology

## 2013-08-09 VITALS — BP 150/88 | HR 76 | Ht 63.0 in | Wt 170.0 lb

## 2013-08-09 DIAGNOSIS — N993 Prolapse of vaginal vault after hysterectomy: Secondary | ICD-10-CM

## 2013-08-09 DIAGNOSIS — N3946 Mixed incontinence: Secondary | ICD-10-CM

## 2013-08-09 NOTE — Progress Notes (Signed)
Encounter and urodynamics reports reviewed by Dr. Josefa Half.

## 2013-08-09 NOTE — Progress Notes (Signed)
Subjective  Patient is here to discuss urodynamic testing and potential surgery for prolapse and incontinence.  Stopped Lisbeth Ply due to recent pneumonia and then stopped again for urodynamic testing. Feels it is helping her.  Some dry mouth, but this is not troublesome for the patient.   Wants surgery for after June 20th, a wedding. Very active and plays tennis.   Objective  Uroflow testing - voided 88 cc and PVR none.  Continuous.  CMG - S1 107 cc, S2 181 cc, S3 298 cc. VLPP 93 cm H20. Stable CMG.  UPP - 27 cm H20 and 50 cm H20.  Pressure flow - Voided volume 349 cc.  Pdet Max 989 cm H20.  Pelvic exam  Third degree cystocele, a little more than first degree vaginal vault prolapse, second degree rectocele.   Absent cervix.  No midline nor adnexal masses or tenderness. Rectovaginal examination confirms the above.   Assessment  Incomplete vaginal prolapse. Mixed incontinence.  Some improvement with Toviaz.   Plan  I have had a comprehensive discussion with the patient regarding prolapse and urinary incontinence.  I have provided reading materials from ACOG regarding prolapse and incontinence in general as well as medical and surgical treatment for these conditions. Medical treatments may include physical therapy, pessary use, anticholinergic/antimuscarinic therapy.   We discussed multiple routes of approach to surgery including: - abdominal sacrocolpopexy with permanent graft with possible Burch, and anterior and posterior colporrhaphy. - vaginal approach with anterior and posterior colporrhaphy with possible sacrospinous fixation using native tissue repair or augmentation with bovine or porcine graft, and TVT midurethral sling and cystoscopy.   We discussed benefits and risks of surgery which include but are not limited to bleeding, infection, damage to surrounding organs, ureteral damage, vaginal pain with intercourse, mesh erosion and exposure, dyspareunia, urinary retention  and need for prolonged catheterization and/or self catheterization, reoperation, recurrence of prolapse and incontinence,  DVT, PE, death, and reaction to anesthesia.    I have discussed surgical expectations regarding the procedures and success rates, outcomes, and recovery.     Patient would like to pursue an abdominal sacrocolpopexy with possible Halban's culdoplasty, anterior and posterior colporrhaphy with TVT and cystoscopy.  We will schedule for this.   30 minutes face to face time of which over 50% was spent in counseling.   After visit summary to patient.

## 2013-08-14 ENCOUNTER — Telehealth: Payer: Self-pay | Admitting: Obstetrics and Gynecology

## 2013-08-14 NOTE — Telephone Encounter (Signed)
Advised patient of responsibility for surgeon fees $431.70. Explained that plan pays 80/20.  Advised patient that [name] will contact her to schedule. Patient prefers any time after June 20th.

## 2013-08-28 ENCOUNTER — Telehealth: Payer: Self-pay | Admitting: Obstetrics and Gynecology

## 2013-08-28 NOTE — Telephone Encounter (Signed)
Pt calling to schedule surgery.

## 2013-08-28 NOTE — Telephone Encounter (Signed)
Routing to Sally for surgery scheduling.   

## 2013-08-28 NOTE — Telephone Encounter (Signed)
Surgery scheduled for 11-06-13 based on patient's request for date after 11-03-13.  Call to patient. Reviewed surgery instruction sheet and will mail to patient.  Pre/post op appointments scheduled.    Patient reports a history of claustrophobia and extreme anxiety with previous surgery when oxygen mask placed over face.  She reports she needs anesthesia to wait until she is asleep before using.  Advised this is something she will need to discuss at PAT appointment.  I can let them know about this but will need to be addressed with anesthesia.  Will send message to PAT (pre anesthesia testing) VM.  Routing to provider for final review. Patient agreeable to disposition. Will close encounter

## 2013-08-30 ENCOUNTER — Telehealth: Payer: Self-pay | Admitting: *Deleted

## 2013-08-30 NOTE — Telephone Encounter (Signed)
Hassan Rowan notified of patients issues regarding oxygen mask and claustrophobia.  She will note this to discuss with anesthesia.  Routing to provider for final review. Patient agreeable to disposition. Will close encounter

## 2013-08-30 NOTE — Pre-Procedure Instructions (Signed)
Call rec'd from [place] office to make [place] aware pt had bad experience with last anesthesia-O2 mask made her feel claustrophobic and she became so aggitated case was canceled.

## 2013-09-16 ENCOUNTER — Emergency Department (INDEPENDENT_AMBULATORY_CARE_PROVIDER_SITE_OTHER)
Admission: EM | Admit: 2013-09-16 | Discharge: 2013-09-16 | Disposition: A | Discharge status: Home / Self Care | Payer: 59 | Source: Home / Self Care | Attending: Family Medicine | Admitting: Family Medicine

## 2013-09-16 ENCOUNTER — Encounter (HOSPITAL_COMMUNITY): Payer: Self-pay | Admitting: Emergency Medicine

## 2013-09-16 DIAGNOSIS — J069 Acute upper respiratory infection, unspecified: Secondary | ICD-10-CM

## 2013-09-16 DIAGNOSIS — R319 Hematuria, unspecified: Secondary | ICD-10-CM

## 2013-09-16 HISTORY — DX: Urgency of urination: R39.15

## 2013-09-16 LAB — POCT URINALYSIS DIP (DEVICE)
Bilirubin Urine: NEGATIVE
GLUCOSE, UA: NEGATIVE mg/dL
Ketones, ur: NEGATIVE mg/dL
Leukocytes, UA: NEGATIVE
NITRITE: NEGATIVE
PROTEIN: NEGATIVE mg/dL
SPECIFIC GRAVITY, URINE: 1.01 (ref 1.005–1.030)
UROBILINOGEN UA: 0.2 mg/dL (ref 0.0–1.0)
pH: 6.5 (ref 5.0–8.0)

## 2013-09-16 LAB — OCCULT BLOOD, POC DEVICE: Fecal Occult Bld: NEGATIVE

## 2013-09-16 NOTE — Discharge Instructions (Signed)
Thank you for coming in today. Continue allergy medication.  Use tylenol as needed for fever.  Call or go to the emergency room if you get worse, have trouble breathing, have chest pains, or palpitations.  Follow up with your doctor.   Hematuria, Adult Hematuria is blood in your urine. It can be caused by a bladder infection, kidney infection, prostate infection, kidney stone, or cancer of your urinary tract. Infections can usually be treated with medicine, and a kidney stone usually will pass through your urine. If neither of these is the cause of your hematuria, further workup to find out the reason may be needed. It is very important that you tell your health care provider about any blood you see in your urine, even if the blood stops without treatment or happens without causing pain. Blood in your urine that happens and then stops and then happens again can be a symptom of a very serious condition. Also, pain is not a symptom in the initial stages of many urinary cancers. HOME CARE INSTRUCTIONS   Drink lots of fluid, 3 4 quarts a day. If you have been diagnosed with an infection, cranberry juice is especially recommended, in addition to large amounts of water.  Avoid caffeine, tea, and carbonated beverages, because they tend to irritate the bladder.  Avoid alcohol because it may irritate the prostate.  Only take over-the-counter or prescription medicines for pain, discomfort, or fever as directed by your health care provider.  If you have been diagnosed with a kidney stone, follow your health care provider's instructions regarding straining your urine to catch the stone.  Empty your bladder often. Avoid holding urine for long periods of time.  After a bowel movement, women should cleanse front to back. Use each tissue only once.  Empty your bladder before and after sexual intercourse if you are a female. SEEK MEDICAL CARE IF: You develop back pain, fever, a feeling of sickness in your  stomach (nausea), or vomiting or if your symptoms are not better in 3 days. Return sooner if you are getting worse. SEEK IMMEDIATE MEDICAL CARE IF:   You have a persistent fever, with a temperature of 101.6F (38.8C) or greater.  You develop severe vomiting and are unable to keep the medicine down.  You develop severe back or abdominal pain despite taking your medicines.  You begin passing a large amount of blood or clots in your urine.  You feel extremely weak or faint, or you pass out. MAKE SURE YOU:   Understand these instructions.  Will watch your condition.  Will get help right away if you are not doing well or get worse. Document Released: 05/03/2005 Document Revised: 02/21/2013 Document Reviewed: 01/01/2013 [facility] Patient Information 2014 [place].

## 2013-09-16 NOTE — ED Provider Notes (Signed)
[name] is a 63 y.o. female who presents to Urgent Care today for fevers chills and rectal bleeding. 3 days ago patient developed fevers and chills. This is associated with a sore throat. She also notes some itchy watery eyes and coughing congestion. She attributes the respiratory symptoms have seasonal allergies. However yesterday she notes pain patient to reddish urine however she attributed that to eating beets. This morning she's had 3 formed stools and notes bright red blood per. She denies any abdominal pain nausea or diarrhea. She denies any vomiting. She feels normal otherwise.   Past Medical History  Diagnosis Date  . Bladder tumor 1992    BENIGN  . Fibroid   . Urinary urgency    History  Substance Use Topics  . Smoking status: Former Smoker    Types: Cigarettes    Quit date: 04/27/1973  . Smokeless tobacco: Never Used  . Alcohol Use: 8.4 oz/week    14 Glasses of wine per week     Comment: 10-14 glasses of wine a week   ROS as above Medications: No current facility-administered medications for this encounter.   Current Outpatient Prescriptions  Medication Sig Dispense Refill  . estradiol (MINIVELLE) 0.0375 MG/24HR Place 1 patch onto the skin 2 (two) times a week. Apply anywhere on lower abdomen.  24 patch  3  . fesoterodine (TOVIAZ) 4 MG TB24 tablet Take 1 tablet (4 mg total) by mouth daily.  30 tablet  5  . Vitamin D, Ergocalciferol, (DRISDOL) 50000 UNITS CAPS capsule Take 1 capsule by mouth every 14 (fourteen) days.      . Multiple Vitamin (MULTIVITAMIN) tablet Take 1 tablet by mouth daily.        Exam:  BP 180/89  Pulse 72  Temp(Src) 98.3 F (36.8 C) (Oral)  Resp 16  SpO2 100% Gen: Well NAD HEENT: EOMI,  MMM Lungs: Normal work of breathing. CTABL Heart: RRR no MRG Abd: NABS, Soft. NT, ND no CV angle tenderness to percussion Exts: Brisk capillary refill, warm and well perfused.  Rectum: Normal-appearing and as with no hemorrhoids. No masses palpated.  Soft stool in the vault.  Hemoccult was negative  Results for orders placed during the hospital encounter of 09/16/13 (from the past 24 hour(s))  POCT URINALYSIS DIP (DEVICE)     Status: Abnormal   Collection Time    09/16/13  1:25 PM      Result Value Ref Range   Glucose, UA NEGATIVE  NEGATIVE mg/dL   Bilirubin Urine NEGATIVE  NEGATIVE   Ketones, ur NEGATIVE  NEGATIVE mg/dL   Specific Gravity, Urine 1.010  1.005 - 1.030   Hgb urine dipstick TRACE (*) NEGATIVE   pH 6.5  5.0 - 8.0   Protein, ur NEGATIVE  NEGATIVE mg/dL   Urobilinogen, UA 0.2  0.0 - 1.0 mg/dL   Nitrite NEGATIVE  NEGATIVE   Leukocytes, UA NEGATIVE  NEGATIVE  OCCULT BLOOD, POC DEVICE     Status: None   Collection Time    09/16/13  1:27 PM      Result Value Ref Range   Fecal Occult Bld NEGATIVE  NEGATIVE   No results found.  Assessment and Plan: 63 y.o. female with  1) fever and chills. Likely viral URI. Seems to be resolving. Followup as needed. 2) red blood per rectum. Unclear if this was ever the case. Hemoccult was negative. May have been beets. Followup with primary care provider. 3) hematuria: Asymptomatic. Followup with PCP for repeat testing.  Discussed  warning signs or symptoms. Please see discharge instructions. Patient expresses understanding.    Gregor Hams, MD 09/16/13 1351

## 2013-09-16 NOTE — ED Notes (Signed)
Had chills and fever 2 night ago along with slight sore throat ("I though it was allergies"), and low back pain.Yesterday had some slight nausea and noticed pink urine (states had beets for lunch day prior), then felt better by afternoon.  This morning had BM x 3 (formed) - noticed bright red blood on toilet tissue and pink toilet water.  Denies any abd pain.

## 2013-09-17 ENCOUNTER — Telehealth: Payer: Self-pay | Admitting: Obstetrics and Gynecology

## 2013-09-17 NOTE — Telephone Encounter (Signed)
Spoke with patient. Patient states that Friday she began to have fevers and chills. Saturday no fever but was really tired. Sunday had 3 bowel movements in one hour and noticed blood when she wiped. Was seen at Urgent Care on Sunday (notes in EPIC) and was told no blood in stool but there is blood in her urine and she needs to follow up with PCP. Patient denies flank pain, fevers, chills, and does not see blood in urine currently. Patient would like to be seen by Dr.Silva this week. Appointment scheduled for Wednesday at 1300 with Dr.Silva. Patient agreeable to date and time. Advised to call back if she begins to experience any new symptoms so that we can see her sooner. Patient agreeable.  Dr.Silva, Okay for patient to be seen on Wednesday as this is first available or would you like her to be seen sooner?

## 2013-09-17 NOTE — Telephone Encounter (Signed)
Patient says she is having bloody stools. Patient went to Urgent Care over the weekend and was told she had blood in her urine. Patient would like an appointment.

## 2013-09-17 NOTE — Telephone Encounter (Signed)
Wednesday is fine for follow up for the hematuria, which was microscopic and trace in amount.   If patient is having wright red bleeding per rectum, she will need to see her gastroenterologist in follow up.   This is probably the more urgent follow up needed.

## 2013-09-19 ENCOUNTER — Encounter: Payer: Self-pay | Admitting: Obstetrics and Gynecology

## 2013-09-19 ENCOUNTER — Ambulatory Visit (INDEPENDENT_AMBULATORY_CARE_PROVIDER_SITE_OTHER): Payer: 59 | Admitting: Obstetrics and Gynecology

## 2013-09-19 VITALS — BP 140/80 | HR 84 | Ht 63.0 in | Wt 168.4 lb

## 2013-09-19 DIAGNOSIS — R319 Hematuria, unspecified: Secondary | ICD-10-CM

## 2013-09-19 DIAGNOSIS — K625 Hemorrhage of anus and rectum: Secondary | ICD-10-CM

## 2013-09-19 LAB — POCT URINALYSIS DIPSTICK
BILIRUBIN UA: NEGATIVE
Glucose, UA: NEGATIVE
KETONES UA: NEGATIVE
Leukocytes, UA: NEGATIVE
Nitrite, UA: NEGATIVE
PH UA: 5
PROTEIN UA: NEGATIVE
RBC UA: NEGATIVE
Urobilinogen, UA: NEGATIVE

## 2013-09-19 NOTE — Patient Instructions (Signed)
I will see you for the preop visit.

## 2013-09-19 NOTE — Progress Notes (Signed)
Patient ID: [name], female   DOB: 1951/04/01, 63 y.o.   MRN: [id number] GYNECOLOGY VISIT  PCP:   Donnie Coffin, MD  Referring provider:   HPI: 63 y.o.   Married  Caucasian  female   [phone] with No LMP recorded. Patient has had a hysterectomy.   here for hematuria.  Went to the Urgent Care for malaise, diarrhea, and blood in the stool on 3 days ago. Diagnosed with a viral syndrome. Noted to have blood in the urine but not in the stool.  Trace amount of heme in the urine.   Some low back pain.  Abdomen felt bloated the week before.  No history of kidney stones.  History of bladder tumor excised in 1992.  No dysuria.   History of hemorrhoids.  Used Preparation H in the past many years ago.  Uses stool softener nightly.  Last colonoscopy was 2007 or sooner.  Patient thinks it was a couple of years ago.  History of diverticulitis.   No recent intercourse.  Not using any vaginal estrogen treatments.   Patient is scheduled for prolapse surgery on 11/06/13.  Urine:  Neg  GYNECOLOGIC HISTORY: No LMP recorded. Patient has had a hysterectomy. Sexually active:  yes Partner preference: female Contraception:   Hysterectomy Menopausal hormone therapy: Minivelle DES exposure:   no Blood transfusions:   no Sexually transmitted diseases:  no   GYN procedures and prior surgeries:  TAH/BSO in 2006 for a dermoid. Last mammogram:  01/2013 wnl               Last pap and high risk HPV testing:  2007 wnl   History of abnormal pap smear:   One in remote past   OB History   Grav Para Term Preterm Abortions TAB SAB Ect Mult Living   _0 LIFESTYLE: Exercise:    Tennis and walking           Tobacco:  no Alcohol:    1-2 glasses of wine per night Drug use:  no  There are no active problems to display for this patient.   Past Medical History  Diagnosis Date  . Bladder tumor 1992    BENIGN  . Fibroid   . Urinary urgency     Past Surgical History  Procedure  Laterality Date  . Anterior cruciate ligament repair Right   . Bladder tumor excision  1992    benign  . Total abdominal hysterectomy w/ bilateral salpingoophorectomy  06/15/04    dermoid and AUB  . Abdominal hysterectomy      Current Outpatient Prescriptions  Medication Sig Dispense Refill  . docusate sodium (COLACE) 100 MG capsule Take 100 mg by mouth. Takes 2 tablets at night      . estradiol (MINIVELLE) 0.0375 MG/24HR Place 1 patch onto the skin 2 (two) times a week. Apply anywhere on lower abdomen.  24 patch  3  . fesoterodine (TOVIAZ) 4 MG TB24 tablet Take 1 tablet (4 mg total) by mouth daily.  30 tablet  5  . Multiple Vitamin (MULTIVITAMIN) tablet Take 1 tablet by mouth daily.      . Vitamin D, Ergocalciferol, (DRISDOL) 50000 UNITS CAPS capsule Take 1 capsule by mouth every 14 (fourteen) days.       No current facility-administered medications for this visit.     ALLERGIES: Sulfa antibiotics  Family History  Problem Relation Age of Onset  .  Cancer Maternal Grandfather     History   Social History  . Marital Status: Married    Spouse Name: N/A    Number of Children: 3  . Years of Education: N/A   Occupational History  . Not on file.   Social History Main Topics  . Smoking status: Former Smoker    Types: Cigarettes    Quit date: 04/27/1973  . Smokeless tobacco: Never Used  . Alcohol Use: 8.4 oz/week    14 Glasses of wine per week     Comment: 10-14 glasses of wine a week  . Drug Use: No  . Sexual Activity: Not on file     Comment: vasectomy/TAH   Other Topics Concern  . Not on file   Social History Narrative  . No narrative on file    ROS:  Pertinent items are noted in HPI.  PHYSICAL EXAMINATION:    BP 140/80  Pulse 84  Ht _0  (1.6 m)  Wt 168 lb 6.4 oz (76.386 kg)  BMI 29.84 kg/m2   Wt Readings from Last 3 Encounters:  09/19/13 168 lb 6.4 oz (76.386 kg)  08/09/13 170 lb (77.111 kg)  08/03/13 170 lb (77.111 kg)     Ht Readings from Last 3  Encounters:  09/19/13 _1  (1.6 m)  08/09/13 _2  (1.6 m)  07/13/13 _3  (1.6 m)    General appearance: alert, cooperative and appears stated age Abdomen: Pfannenstiel incision, soft, non-tender; no masses,  no organomegaly   No abnormal inguinal nodes palpated Neurologic: Grossly normal  Pelvic: External genitalia:  no lesions              Urethra:  normal appearing urethra with no masses, tenderness or lesions              Bartholins and Skenes: normal                 Vagina: normal appearing vagina with normal color and discharge, no lesions.  Second degree cystocele, first degree vault prolapse, third degree rectocele.  No ulcerations, lacerations, or lesions noted.               Cervix:  absent                 Bimanual Exam:  Uterus:   absent                                      Adnexa: normal adnexa in size, nontender and no masses                                      Rectovaginal: Confirms                                      Anus:  normal sphincter tone, no lesions.  No masses.   ASSESSMENT  Incomplete vaginal prolapse.  Trace hematuria at urgent care.  Negative urine dip here today.  Rectal bleeding during viral episode.  History of diverticulitis with no current suspicion for this.  Remote history of benign bladder tumor.  PLAN  Will give patient IFOB kit and have her complete it next week.  If IFOB shows any blood, will have patient see GI  before her prolapse surgery in June.  Return for preop visit.    An After Visit Summary was printed and given to the patient.  25 minutes face to face time of which over 50% was spent in counseling.

## 2013-09-20 NOTE — Telephone Encounter (Signed)
See OV note from 09-19-13. Encounter closed.

## 2013-09-21 ENCOUNTER — Other Ambulatory Visit: Payer: Self-pay | Admitting: *Deleted

## 2013-09-21 NOTE — Telephone Encounter (Signed)
Incoming fax from [facility] requesting [facility] refill  Last AEX 01/29/13 Last MMG 02/09/13 BI-RADS 1: Neg Last refill 02/08/13 #24/ 3 refills Next appt 02/01/14  Rx refused. Patient should have enough refills on file to last until 01/2014.

## 2013-10-10 LAB — FECAL OCCULT BLOOD, IMMUNOCHEMICAL: IMMUNOLOGICAL FECAL OCCULT BLOOD TEST: NEGATIVE

## 2013-10-10 NOTE — Addendum Note (Signed)
Addended by: Susy Manor on: 10/10/2013 08:52 AM   Modules accepted: Orders

## 2013-10-12 ENCOUNTER — Telehealth: Payer: Self-pay | Admitting: Obstetrics and Gynecology

## 2013-10-12 NOTE — Telephone Encounter (Signed)
MAILED LETTER TO PATIENT::  Oct 12, 2013   Dear [name],  Your surgery is scheduled for June 23,2015.  Upon requesting authorization for your surgery, your insurance company has informed us that they will cover 80% of the charges, and you will be responsible to pay approximately $431.70.  It is our office policy that this amount is paid in full two weeks prior to your surgery. Your payment is due on June 09.  If there is a balance due after your insurance company pays their portion, we will send you a bill.  If there is a refund due to you, we will send you a check within one month.  Payment may be made by cash, check, Visa, Nurse, learning disability. Payment can be made in the office or over the telephone.  If payment is not made two weeks prior to your surgery, we will have to reschedule your surgery.  The above fee includes only our fee for the surgery and does not include charges you may have from the facility, anesthesia or pathology.  If you have any questions, please call us at [phone].

## 2013-10-17 ENCOUNTER — Encounter: Payer: Self-pay | Admitting: Obstetrics and Gynecology

## 2013-10-17 ENCOUNTER — Ambulatory Visit (INDEPENDENT_AMBULATORY_CARE_PROVIDER_SITE_OTHER): Payer: 59 | Admitting: Obstetrics and Gynecology

## 2013-10-17 VITALS — BP 140/80 | HR 70 | Ht 63.0 in | Wt 168.0 lb

## 2013-10-17 DIAGNOSIS — N8110 Cystocele, unspecified: Secondary | ICD-10-CM

## 2013-10-17 DIAGNOSIS — N3946 Mixed incontinence: Secondary | ICD-10-CM

## 2013-10-17 MED ORDER — ESTROGENS, CONJUGATED 0.625 MG/GM VA CREA: 1.0000 | TOPICAL_CREAM | Freq: Every day | VAGINAL | Status: DC

## 2013-10-17 MED ORDER — OXYCODONE-ACETAMINOPHEN 5-325 MG PO TABS: 2.0000 | ORAL_TABLET | ORAL | Status: DC | PRN

## 2013-10-17 MED ORDER — IBUPROFEN 800 MG PO TABS: 800.0000 mg | ORAL_TABLET | Freq: Three times a day (TID) | ORAL | Status: DC | PRN

## 2013-10-17 NOTE — Progress Notes (Signed)
Patient ID: [name], female   DOB: 09-11-1950, 63 y.o.   MRN: [id number] GYNECOLOGY  VISIT   HPI: 63 y.o.   Married  Caucasian  female   [phone] with No LMP recorded. Patient has had a hysterectomy.   here to discuss upcoming surgery for vaginal prolapse and mixed urinary incontinence.  Had urodynamic testing showing genuine stress incontinence.   Patient would like to pursue an abdominal sacrocolpopexy with possible Halban's culdoplasty, anterior and posterior colporrhaphy with TVT and cystoscopy.   Stepped on a rusty nail and is now on an antibiotic for this.   Has not had any further bleeding.  IFOB negative.   Husband present for discussion portion of procedure.   GYNECOLOGIC HISTORY: No LMP recorded. Patient has had a hysterectomy. Contraception: hysterectomy Menopausal hormone therapy:  Minivelle        OB History   Grav Para Term Preterm Abortions TAB SAB Ect Mult Living   3 3 3       3          There are no active problems to display for this patient.   Past Medical History  Diagnosis Date  . Bladder tumor 1992    BENIGN  . Fibroid   . Urinary urgency     Past Surgical History  Procedure Laterality Date  . Anterior cruciate ligament repair Right   . Bladder tumor excision  1992    benign  . Total abdominal hysterectomy w/ bilateral salpingoophorectomy  06/15/04    dermoid and AUB  . Abdominal hysterectomy      Current Outpatient Prescriptions  Medication Sig Dispense Refill  . docusate sodium (COLACE) 100 MG capsule Take 100 mg by mouth. Takes 2 tablets at night      . estradiol (MINIVELLE) 0.0375 MG/24HR Place 1 patch onto the skin 2 (two) times a week. Apply anywhere on lower abdomen.  24 patch  3  . fesoterodine (TOVIAZ) 4 MG TB24 tablet Take 1 tablet (4 mg total) by mouth daily.  30 tablet  5  . Multiple Vitamin (MULTIVITAMIN) tablet Take 1 tablet by mouth daily.      . Vitamin D, Ergocalciferol, (DRISDOL) 50000 UNITS CAPS capsule Take 1  capsule by mouth every 14 (fourteen) days.       No current facility-administered medications for this visit.     ALLERGIES: Sulfa antibiotics  Family History  Problem Relation Age of Onset  . Cancer Maternal Grandfather     History   Social History  . Marital Status: Married    Spouse Name: N/A    Number of Children: 3  . Years of Education: N/A   Occupational History  . Not on file.   Social History Main Topics  . Smoking status: Former Smoker    Types: Cigarettes    Quit date: 04/27/1973  . Smokeless tobacco: Never Used  . Alcohol Use: 8.4 oz/week    14 Glasses of wine per week     Comment: 10-14 glasses of wine a week  . Drug Use: No  . Sexual Activity: Not on file     Comment: vasectomy/TAH   Other Topics Concern  . Not on file   Social History Narrative  . No narrative on file    ROS:  Pertinent items are noted in HPI.  PHYSICAL EXAMINATION:    BP 140/80  Pulse 70  Ht 5\' 3"  (1.6 m)  Wt 168 lb (76.204 kg)  BMI 29.77 kg/m2     General  appearance: alert, cooperative and appears stated age Lungs: clear to auscultation bilaterally Heart: regular rate and rhythm Abdomen: Pfannenstiel incision, soft, non-tender; no masses,  no organomegaly Breasts:  No masses, retractions, nipple discharge, or axillary nodes palpable.  Right nipple inverted (old change.) No abnormal inguinal nodes palpated  Pelvic: External genitalia:  no lesions              Urethra:  normal appearing urethra with no masses, tenderness or lesions              Bartholins and Skenes: normal                 Vagina: normal appearing vagina with normal color and discharge, no lesions, second degree cystocele lane rectocele.  First degree vaginal vault prolapse.              Cervix:  absent                   Bimanual Exam:  Uterus:  absent                                      Adnexa: no masses                                      Rectovaginal: Confirms                                       Anus:  normal sphincter tone, no lesions  ASSESSMENT  Incomplete vagina prolapse. Mixed incontinence.   PLAN  Percocet and Motrin Rx for post op pain.  See Epic. Premarin vagina cream 1/2 gm twice weekly.  See Epic.  Counseled on procedure of abdominal sacrocolpopexy, anterior and posterior colporrhapy, TVT Exact, cystoscopy.  Risks benefits and alternative discussed with the patient who wishes to proceed. Proceed with surgery on 11/06/13. I discussed expectations from surgical recovery.    An After Visit Summary was printed and given to the patient.  _40_____ minutes face to face time of which over 50% was spent in counseling.

## 2013-10-17 NOTE — Patient Instructions (Signed)
I will see you the morning of surgery.

## 2013-10-22 ENCOUNTER — Encounter: Payer: Self-pay | Admitting: Nurse Practitioner

## 2013-10-22 ENCOUNTER — Ambulatory Visit (INDEPENDENT_AMBULATORY_CARE_PROVIDER_SITE_OTHER): Payer: 59 | Admitting: Nurse Practitioner

## 2013-10-22 VITALS — BP 134/90 | HR 64 | Ht 63.0 in | Wt 165.0 lb

## 2013-10-22 DIAGNOSIS — N898 Other specified noninflammatory disorders of vagina: Secondary | ICD-10-CM

## 2013-10-22 DIAGNOSIS — L293 Anogenital pruritus, unspecified: Secondary | ICD-10-CM

## 2013-10-22 LAB — POCT URINALYSIS DIPSTICK
Bilirubin, UA: NEGATIVE
Glucose, UA: NEGATIVE
Ketones, UA: NEGATIVE
LEUKOCYTES UA: NEGATIVE
Nitrite, UA: NEGATIVE
PH UA: 5
PROTEIN UA: NEGATIVE
RBC UA: NEGATIVE
Urobilinogen, UA: NEGATIVE

## 2013-10-22 MED ORDER — FLUCONAZOLE 150 MG PO TABS: 150.0000 mg | ORAL_TABLET | Freq: Once | ORAL | Status: DC

## 2013-10-22 NOTE — Progress Notes (Signed)
Subjective:     Patient ID: [name], female   DOB: Apr 29, 1951, 63 y.o.   MRN: [id number]  HPI This 63 yo MW Fe has been on Keflex for an injury to her foot after stepping on a nail.  She now has usual symptoms of yeast infection that started on Friday and became much worse through the weekend.  Awoke last night scratching at herself.  She was to start on her Premarin vaginal cream on Friday but delayed this until she was checked for this infection.  She is to have surgery on 11/06/13. She had some increase in urinary frequency about this same time but is already on antibiotics.  Review of Systems  Constitutional: Negative.   Respiratory: Negative.   Cardiovascular: Negative.   Gastrointestinal: Negative.   Genitourinary: Positive for frequency, vaginal discharge and vaginal pain.  Skin: Negative.   Neurological: Negative.        Objective:   Physical Exam  Constitutional: She is oriented to person, place, and time. She appears well-developed and well-nourished.  Pulmonary/Chest: Effort normal.  Abdominal: Soft. She exhibits no distension and no mass. There is no tenderness. There is no rebound and no guarding.  Genitourinary:  White thick cottage cheesy vaginal discharge consistent with yeast. No pain at the bladder.  Wet prep is not done.    Neurological: She is alert and oriented to person, place, and time.  Psychiatric: She has a normal mood and affect. Her behavior is normal. Judgment and thought content normal.       Assessment:     Vaginal discharge consistent with yeast on antibiotics History of urine frequency with upcoming surgery on 11/06/13    Plan:     Treatment with Diflucan 150 mg X 2 Tonight start on vaginal estrogen therapy Will recheck a urine in 1 week to make sure no signs of infection prior to surgery. Did not sent urine culture today as she is on antibiotics.

## 2013-10-22 NOTE — Patient Instructions (Signed)
Monilial Vaginitis  Vaginitis in a soreness, swelling and redness (inflammation) of the vagina and vulva. Monilial vaginitis is not a sexually transmitted infection.  CAUSES   Yeast vaginitis is caused by yeast (candida) that is normally found in your vagina. With a yeast infection, the candida has overgrown in number to a point that upsets the chemical balance.  SYMPTOMS   · White, thick vaginal discharge.  · Swelling, itching, redness and irritation of the vagina and possibly the lips of the vagina (vulva).  · Burning or painful urination.  · Painful intercourse.  DIAGNOSIS   Things that may contribute to monilial vaginitis are:  · Postmenopausal and virginal states.  · Pregnancy.  · Infections.  · Being tired, sick or stressed, especially if you had monilial vaginitis in the past.  · Diabetes. Good control will help lower the chance.  · Birth control pills.  · Tight fitting garments.  · Using bubble bath, feminine sprays, douches or deodorant tampons.  · Taking certain medications that kill germs (antibiotics).  · Sporadic recurrence can occur if you become ill.  TREATMENT   Your caregiver will give you medication.  · There are several kinds of anti monilial vaginal creams and suppositories specific for monilial vaginitis. For recurrent yeast infections, use a suppository or cream in the vagina 2 times a week, or as directed.  · Anti-monilial or steroid cream for the itching or irritation of the vulva may also be used. Get your caregiver's permission.  · Painting the vagina with methylene blue solution may help if the monilial cream does not work.  · Eating yogurt may help prevent monilial vaginitis.  HOME CARE INSTRUCTIONS   · Finish all medication as prescribed.  · Do not have sex until treatment is completed or after your caregiver tells you it is okay.  · Take warm sitz baths.  · Do not douche.  · Do not use tampons, especially scented ones.  · Wear cotton underwear.  · Avoid tight pants and panty  hose.  · Tell your sexual partner that you have a yeast infection. They should go to their caregiver if they have symptoms such as mild rash or itching.  · Your sexual partner should be treated as well if your infection is difficult to eliminate.  · Practice safer sex. Use condoms.  · Some vaginal medications cause latex condoms to fail. Vaginal medications that harm condoms are:  · Cleocin cream.  · Butoconazole (Femstat®).  · Terconazole (Terazol®) vaginal suppository.  · Miconazole (Monistat®) (may be purchased over the counter).  SEEK MEDICAL CARE IF:   · You have a temperature by mouth above 102° F (38.9° C).  · The infection is getting worse after 2 days of treatment.  · The infection is not getting better after 3 days of treatment.  · You develop blisters in or around your vagina.  · You develop vaginal bleeding, and it is not your menstrual period.  · You have pain when you urinate.  · You develop intestinal problems.  · You have pain with sexual intercourse.  Document Released: 02/10/2005 Document Revised: 07/26/2011 Document Reviewed: 10/25/2008  Exit[employer]® Patient Information ©2014 [employer].

## 2013-10-28 NOTE — Progress Notes (Signed)
Encounter reviewed by Dr. Brook Silva.  

## 2013-10-29 ENCOUNTER — Encounter (HOSPITAL_COMMUNITY): Payer: Self-pay | Admitting: Pharmacist

## 2013-10-30 ENCOUNTER — Encounter: Payer: Self-pay | Admitting: *Deleted

## 2013-10-30 ENCOUNTER — Telehealth: Payer: Self-pay | Admitting: Obstetrics and Gynecology

## 2013-10-30 ENCOUNTER — Encounter (HOSPITAL_COMMUNITY)
Admission: RE | Admit: 2013-10-30 | Discharge: 2013-10-30 | Disposition: A | Discharge status: Home / Self Care | Payer: 59 | Source: Ambulatory Visit | Attending: Obstetrics and Gynecology | Admitting: Obstetrics and Gynecology

## 2013-10-30 ENCOUNTER — Ambulatory Visit (INDEPENDENT_AMBULATORY_CARE_PROVIDER_SITE_OTHER): Payer: 59 | Admitting: *Deleted

## 2013-10-30 ENCOUNTER — Encounter (HOSPITAL_COMMUNITY): Payer: Self-pay

## 2013-10-30 VITALS — BP 140/86 | HR 60 | Ht 63.0 in | Wt 165.0 lb

## 2013-10-30 DIAGNOSIS — Z01818 Encounter for other preprocedural examination: Secondary | ICD-10-CM | POA: Insufficient documentation

## 2013-10-30 DIAGNOSIS — Z01812 Encounter for preprocedural laboratory examination: Secondary | ICD-10-CM | POA: Insufficient documentation

## 2013-10-30 DIAGNOSIS — R319 Hematuria, unspecified: Secondary | ICD-10-CM

## 2013-10-30 LAB — POCT URINALYSIS DIPSTICK
BILIRUBIN UA: NEGATIVE
Blood, UA: NEGATIVE
GLUCOSE UA: NEGATIVE
Ketones, UA: NEGATIVE
LEUKOCYTES UA: NEGATIVE
Nitrite, UA: NEGATIVE
PROTEIN UA: NEGATIVE
Urobilinogen, UA: NEGATIVE
pH, UA: 6

## 2013-10-30 LAB — CBC
HEMATOCRIT: 44.2 % (ref 36.0–46.0)
HEMOGLOBIN: 14.8 g/dL (ref 12.0–15.0)
MCH: 32.2 pg (ref 26.0–34.0)
MCHC: 33.5 g/dL (ref 30.0–36.0)
MCV: 96.1 fL (ref 78.0–100.0)
Platelets: 351 10*3/uL (ref 150–400)
RBC: 4.6 MIL/uL (ref 3.87–5.11)
RDW: 13.3 % (ref 11.5–15.5)
WBC: 7.5 10*3/uL (ref 4.0–10.5)

## 2013-10-30 NOTE — Progress Notes (Signed)
Patient ID: [name], female   DOB: 13-Apr-1951, 63 y.o.   MRN: [id number] Pt presented prior to surgery for urine dipstick due to previous hematuria.  All results negative or normal.  Pt still complains of vaginal itching, but no urinary symptoms.

## 2013-10-30 NOTE — Patient Instructions (Signed)
[place]  10/30/2013   Your procedure is scheduled on:  11/06/13  Enter through the Main Entrance of [facility] at [facility] up the phone at the desk and dial [phone].   Call this number if you have problems the morning of surgery: [phone]   Remember:   Do not eat food:After Midnight.  Do not drink clear liquids: After Midnight.  Take these medicines the morning of surgery with A SIP OF WATER: NA   Do not wear jewelry, make-up or nail polish.  Do not wear lotions, powders, or perfumes. You may wear deodorant.  Do not shave 48 hours prior to surgery.  Do not bring valuables to the hospital.  [facility] is not   responsible for any belongings or valuables brought to the hospital.  Contacts, dentures or bridgework may not be worn into surgery.  Leave suitcase in the car. After surgery it may be brought to your room.  For patients admitted to the hospital, checkout time is 11:00 AM the day of              discharge.   Patients discharged the day of surgery will not be allowed to drive             home.  Name and phone number of your driver: NA  Special Instructions:      Please read over the following fact sheets that you were given:   Surgical Site Infection Prevention

## 2013-10-30 NOTE — Telephone Encounter (Signed)
Patient removed her minivelle patch by accident today, thinking today was Wednesday her normal replacement day. What does she ned to do now?

## 2013-10-30 NOTE — Telephone Encounter (Signed)
Spoke with patient. Patient states that she took patch off this morning and replaced it and then realized she was a day early. Advised patient that this is okay that she will just have to switch days that she is switching her patch. Patient states that she usually switches her patch on Sunday. Advised patient to now switch on Saturday. Patient agreeable and verbalizes understanding.  Routing to provider for final review. Patient agreeable to disposition. Will close encounter

## 2013-10-30 NOTE — Telephone Encounter (Signed)
Left message to call Ariell Gunnels at [phone]. 

## 2013-11-01 NOTE — Progress Notes (Signed)
Encounter reviewed by Dr. Brook Silva.  

## 2013-11-05 ENCOUNTER — Telehealth: Payer: Self-pay | Admitting: Obstetrics and Gynecology

## 2013-11-05 MED ORDER — DEXTROSE 5 % IV SOLN
2.0000 g | INTRAVENOUS | Status: AC
  Administered 2013-11-06: 2 g via INTRAVENOUS
  Filled 2013-11-05: qty 2

## 2013-11-05 NOTE — Telephone Encounter (Signed)
Message left to return call to [facility] at [phone].  Please give message below from Dr. Quincy Simmonds.

## 2013-11-05 NOTE — H&P (Signed)
Brook E Amundson de Berton Lan, MD at 10/17/2013  1:56 PM      Status: Signed           Patient ID: [name], female   DOB: 06-25-1950, 63 y.o.   MRN: [id number] GYNECOLOGY  VISIT   HPI: 63 y.o.   Married  Caucasian  female    [phone] with No LMP recorded. Patient has had a hysterectomy.    here to discuss upcoming surgery for vaginal prolapse and mixed urinary incontinence.   Had urodynamic testing showing genuine stress incontinence.    Patient would like to pursue an abdominal sacrocolpopexy with possible Halban's culdoplasty, anterior and posterior colporrhaphy with TVT and cystoscopy.    Stepped on a rusty nail and is now on an antibiotic for this.    Has not had any further bleeding.   IFOB negative.    Husband present for discussion portion of procedure.    GYNECOLOGIC HISTORY: No LMP recorded. Patient has had a hysterectomy. Contraception: hysterectomy Menopausal hormone therapy:  Minivelle         OB History     Grav  Para  Term  Preterm  Abortions  TAB  SAB  Ect  Mult  Living     3  3  3              3              There are no active problems to display for this patient.      Past Medical History   Diagnosis  Date   .  Bladder tumor  1992       BENIGN   .  Fibroid     .  Urinary urgency           Past Surgical History   Procedure  Laterality  Date   .  Anterior cruciate ligament repair  Right     .  Bladder tumor excision    1992       benign   .  Total abdominal hysterectomy w/ bilateral salpingoophorectomy    06/15/04       dermoid and AUB   .  Abdominal hysterectomy             Current Outpatient Prescriptions   Medication  Sig  Dispense  Refill   .  docusate sodium (COLACE) 100 MG capsule  Take 100 mg by mouth. Takes 2 tablets at night         .  estradiol (MINIVELLE) 0.0375 MG/24HR  Place 1 patch onto the skin 2 (two) times a week. Apply anywhere on lower abdomen.   24 patch   3   .  fesoterodine (TOVIAZ) 4 MG TB24 tablet   Take 1 tablet (4 mg total) by mouth daily.   30 tablet   5   .  Multiple Vitamin (MULTIVITAMIN) tablet  Take 1 tablet by mouth daily.         .  Vitamin D, Ergocalciferol, (DRISDOL) 50000 UNITS CAPS capsule  Take 1 capsule by mouth every 14 (fourteen) days.             No current facility-administered medications for this visit.        ALLERGIES: Sulfa antibiotics    Family History   Problem  Relation  Age of Onset   .  Cancer  Maternal Grandfather           History  Social History   .  Marital Status:  Married       Spouse Name:  N/A       Number of Children:  3   .  Years of Education:  N/A       Occupational History   .  Not on file.       Social History Main Topics   .  Smoking status:  Former Smoker       Types:  Cigarettes       Quit date:  04/27/1973   .  Smokeless tobacco:  Never Used   .  Alcohol Use:  8.4 oz/week       14 Glasses of wine per week         Comment: 10-14 glasses of wine a week   .  Drug Use:  No   .  Sexual Activity:  Not on file         Comment: vasectomy/TAH       Other Topics  Concern   .  Not on file       Social History Narrative   .  No narrative on file        ROS:  Pertinent items are noted in HPI.   PHYSICAL EXAMINATION:     BP 140/80  Pulse 70  Ht 5\' 3"  (1.6 m)  Wt 168 lb (76.204 kg)  BMI 29.77 kg/m2      General appearance: alert, cooperative and appears stated age Lungs: clear to auscultation bilaterally Heart: regular rate and rhythm Abdomen: Pfannenstiel incision, soft, non-tender; no masses,  no organomegaly Breasts:  No masses, retractions, nipple discharge, or axillary nodes palpable.  Right nipple inverted (old change.) No abnormal inguinal nodes palpated   Pelvic: External genitalia:  no lesions              Urethra:  normal appearing urethra with no masses, tenderness or lesions              Bartholins and Skenes: normal                  Vagina: normal appearing vagina with normal color  and discharge, no lesions, second degree cystocele lane rectocele.  First degree vaginal vault prolapse.              Cervix:  absent                    Bimanual Exam:  Uterus:  absent                                      Adnexa: no masses                                      Rectovaginal: Confirms                                      Anus:  normal sphincter tone, no lesions   ASSESSMENT   Incomplete vagina prolapse. Mixed incontinence.    PLAN   Percocet and Motrin Rx for post op pain.  See Epic. Premarin vagina cream 1/2 gm twice weekly.  See Epic.   Counseled on procedure of abdominal sacrocolpopexy, anterior and posterior  colporrhapy, TVT Exact, cystoscopy.  Risks benefits and alternative discussed with the patient who wishes to proceed. Proceed with surgery on 11/06/13. I discussed expectations from surgical recovery.      An After Visit Summary was printed and given to the patient.   _40_____ minutes face to face time of which over 50% was spent in counseling

## 2013-11-05 NOTE — Telephone Encounter (Signed)
Routing to Dr.Silva for review. 

## 2013-11-05 NOTE — Telephone Encounter (Signed)
Pt is having surgery tomorrow and is not sure if she needs to use the premarin cream tonight.

## 2013-11-05 NOTE — Telephone Encounter (Signed)
Message from Dr. Quincy Simmonds given. Verbalized understanding and follow instructions.  Routing to provider for final review. Patient agreeable to disposition. Will close encounter

## 2013-11-05 NOTE — Telephone Encounter (Signed)
No Premarin cream tonight. She will do the enema tonight as we discussed the day I saw her at the hospital when she had her preop there.

## 2013-11-06 ENCOUNTER — Encounter (HOSPITAL_COMMUNITY): Payer: 59 | Admitting: Anesthesiology

## 2013-11-06 ENCOUNTER — Encounter (HOSPITAL_COMMUNITY)
Admission: RE | Disposition: A | Discharge status: Home / Self Care | Payer: Self-pay | Source: Ambulatory Visit | Attending: Obstetrics and Gynecology

## 2013-11-06 ENCOUNTER — Inpatient Hospital Stay (HOSPITAL_COMMUNITY)
Admission: RE | Admit: 2013-11-06 | Discharge: 2013-11-08 | DRG: 746 | Disposition: A | Discharge status: Home / Self Care | Payer: 59 | Source: Ambulatory Visit | Attending: Obstetrics and Gynecology | Admitting: Obstetrics and Gynecology

## 2013-11-06 ENCOUNTER — Encounter (HOSPITAL_COMMUNITY): Payer: Self-pay | Admitting: Certified Registered"

## 2013-11-06 ENCOUNTER — Inpatient Hospital Stay (HOSPITAL_COMMUNITY): Payer: 59 | Admitting: Anesthesiology

## 2013-11-06 DIAGNOSIS — Z9889 Other specified postprocedural states: Secondary | ICD-10-CM

## 2013-11-06 DIAGNOSIS — N3946 Mixed incontinence: Secondary | ICD-10-CM

## 2013-11-06 DIAGNOSIS — N815 Vaginal enterocele: Secondary | ICD-10-CM | POA: Diagnosis present

## 2013-11-06 DIAGNOSIS — N390 Urinary tract infection, site not specified: Secondary | ICD-10-CM | POA: Diagnosis present

## 2013-11-06 DIAGNOSIS — N393 Stress incontinence (female) (male): Secondary | ICD-10-CM | POA: Diagnosis present

## 2013-11-06 DIAGNOSIS — N993 Prolapse of vaginal vault after hysterectomy: Principal | ICD-10-CM | POA: Diagnosis present

## 2013-11-06 DIAGNOSIS — N812 Incomplete uterovaginal prolapse: Secondary | ICD-10-CM

## 2013-11-06 DIAGNOSIS — N736 Female pelvic peritoneal adhesions (postinfective): Secondary | ICD-10-CM | POA: Diagnosis present

## 2013-11-06 HISTORY — PX: ABDOMINAL SACROCOLPOPEXY: SHX1114

## 2013-11-06 HISTORY — PX: BLADDER SUSPENSION: SHX72

## 2013-11-06 HISTORY — PX: ANTERIOR AND POSTERIOR REPAIR: SHX5121

## 2013-11-06 HISTORY — PX: CYSTOSCOPY: SHX5120

## 2013-11-06 LAB — BASIC METABOLIC PANEL
BUN: 11 mg/dL (ref 6–23)
CALCIUM: 9.4 mg/dL (ref 8.4–10.5)
CHLORIDE: 103 meq/L (ref 96–112)
CO2: 26 meq/L (ref 19–32)
Creatinine, Ser: 0.63 mg/dL (ref 0.50–1.10)
GFR calc Af Amer: >90 mL/min (ref >90)
GFR calc non Af Amer: >90 mL/min (ref >90)
GLUCOSE: 104 mg/dL — AB (ref 70–99)
POTASSIUM: 4 meq/L (ref 3.7–5.3)
SODIUM: 141 meq/L (ref 137–147)

## 2013-11-06 SURGERY — SACROCOLPOPEXY, ABDOMINAL
Anesthesia: General | Site: Vagina

## 2013-11-06 MED ORDER — ACETAMINOPHEN 325 MG PO TABS: 325.0000 mg | ORAL_TABLET | ORAL | Status: DC | PRN

## 2013-11-06 MED ORDER — PROMETHAZINE HCL 25 MG/ML IJ SOLN: 6.2500 mg | INTRAMUSCULAR | Status: DC | PRN

## 2013-11-06 MED ORDER — PROPOFOL 10 MG/ML IV BOLUS
INTRAVENOUS | Status: DC | PRN
  Administered 2013-11-06: 200 mg via INTRAVENOUS

## 2013-11-06 MED ORDER — OXYCODONE-ACETAMINOPHEN 5-325 MG PO TABS: 1.0000 | ORAL_TABLET | ORAL | Status: DC | PRN

## 2013-11-06 MED ORDER — LACTATED RINGERS IV SOLN
INTRAVENOUS | Status: DC
  Administered 2013-11-06 (×3): via INTRAVENOUS

## 2013-11-06 MED ORDER — ONDANSETRON HCL 4 MG PO TABS
4.0000 mg | ORAL_TABLET | Freq: Four times a day (QID) | ORAL | Status: DC | PRN
Start: 2013-11-06 — End: 2013-11-08

## 2013-11-06 MED ORDER — LIDOCAINE-EPINEPHRINE 1 %-1:100000 IJ SOLN
INTRAMUSCULAR | Status: AC
  Filled 2013-11-06: qty 1

## 2013-11-06 MED ORDER — FENTANYL CITRATE 0.05 MG/ML IJ SOLN
INTRAMUSCULAR | Status: DC | PRN
  Administered 2013-11-06 (×7): 50 ug via INTRAVENOUS

## 2013-11-06 MED ORDER — ESTRADIOL 0.1 MG/GM VA CREA
TOPICAL_CREAM | VAGINAL | Status: DC | PRN
  Administered 2013-11-06: 1 via VAGINAL

## 2013-11-06 MED ORDER — LIDOCAINE-EPINEPHRINE 1 %-1:100000 IJ SOLN
INTRAMUSCULAR | Status: DC | PRN
  Administered 2013-11-06: 1 mL
  Administered 2013-11-06 (×2): 10 mL

## 2013-11-06 MED ORDER — KETOROLAC TROMETHAMINE 30 MG/ML IJ SOLN
INTRAMUSCULAR | Status: DC | PRN
  Administered 2013-11-06: 30 mg via INTRAVENOUS

## 2013-11-06 MED ORDER — NEOSTIGMINE METHYLSULFATE 10 MG/10ML IV SOLN
INTRAVENOUS | Status: DC | PRN
  Administered 2013-11-06: 3 mg via INTRAVENOUS

## 2013-11-06 MED ORDER — METHYLENE BLUE 1 % INJ SOLN
INTRAMUSCULAR | Status: AC
  Filled 2013-11-06: qty 1

## 2013-11-06 MED ORDER — LACTATED RINGERS IV SOLN
INTRAVENOUS | Status: DC
  Administered 2013-11-06: 07:00:00 via INTRAVENOUS

## 2013-11-06 MED ORDER — DEXAMETHASONE SODIUM PHOSPHATE 10 MG/ML IJ SOLN
INTRAMUSCULAR | Status: DC | PRN
  Administered 2013-11-06: 10 mg via INTRAVENOUS

## 2013-11-06 MED ORDER — SODIUM CHLORIDE 0.9 % IJ SOLN: 9.0000 mL | INTRAMUSCULAR | Status: DC | PRN

## 2013-11-06 MED ORDER — LACTATED RINGERS IV SOLN
INTRAVENOUS | Status: DC
  Administered 2013-11-06 – 2013-11-07 (×2): via INTRAVENOUS

## 2013-11-06 MED ORDER — ACETAMINOPHEN 160 MG/5ML PO SOLN: 325.0000 mg | ORAL | Status: DC | PRN

## 2013-11-06 MED ORDER — ESTRADIOL 0.0375 MG/24HR TD PTTW
1.0000 | MEDICATED_PATCH | TRANSDERMAL | Status: DC
  Administered 2013-11-07: 1 via TRANSDERMAL

## 2013-11-06 MED ORDER — LIDOCAINE HCL (CARDIAC) 20 MG/ML IV SOLN
INTRAVENOUS | Status: AC
  Filled 2013-11-06: qty 5

## 2013-11-06 MED ORDER — GLYCOPYRROLATE 0.2 MG/ML IJ SOLN
INTRAMUSCULAR | Status: AC
  Filled 2013-11-06: qty 3

## 2013-11-06 MED ORDER — DIPHENHYDRAMINE HCL 12.5 MG/5ML PO ELIX: 12.5000 mg | ORAL_SOLUTION | Freq: Four times a day (QID) | ORAL | Status: DC | PRN

## 2013-11-06 MED ORDER — ESTRADIOL 0.1 MG/GM VA CREA
TOPICAL_CREAM | VAGINAL | Status: AC
  Filled 2013-11-06: qty 42.5

## 2013-11-06 MED ORDER — HYDROMORPHONE HCL PF 1 MG/ML IJ SOLN
INTRAMUSCULAR | Status: DC | PRN
  Administered 2013-11-06: 1 mg via INTRAVENOUS

## 2013-11-06 MED ORDER — ONDANSETRON HCL 4 MG/2ML IJ SOLN
INTRAMUSCULAR | Status: DC | PRN
  Administered 2013-11-06: 4 mg via INTRAVENOUS

## 2013-11-06 MED ORDER — GLYCOPYRROLATE 0.2 MG/ML IJ SOLN
INTRAMUSCULAR | Status: DC | PRN
  Administered 2013-11-06: 0.1 mg via INTRAVENOUS
  Administered 2013-11-06: 0.2 mg via INTRAVENOUS
  Administered 2013-11-06: .4 mg via INTRAVENOUS

## 2013-11-06 MED ORDER — STERILE WATER FOR IRRIGATION IR SOLN
Status: DC | PRN
  Administered 2013-11-06: 3000 mL via INTRAVESICAL

## 2013-11-06 MED ORDER — HYDROMORPHONE HCL PF 1 MG/ML IJ SOLN
INTRAMUSCULAR | Status: AC
  Filled 2013-11-06: qty 1

## 2013-11-06 MED ORDER — PHENYLEPHRINE 40 MCG/ML (10ML) SYRINGE FOR IV PUSH (FOR BLOOD PRESSURE SUPPORT)
PREFILLED_SYRINGE | INTRAVENOUS | Status: AC
  Filled 2013-11-06: qty 5

## 2013-11-06 MED ORDER — MIDAZOLAM HCL 2 MG/2ML IJ SOLN
INTRAMUSCULAR | Status: AC
  Filled 2013-11-06: qty 2

## 2013-11-06 MED ORDER — ONDANSETRON HCL 4 MG/2ML IJ SOLN: 4.0000 mg | Freq: Four times a day (QID) | INTRAMUSCULAR | Status: DC | PRN

## 2013-11-06 MED ORDER — DIPHENHYDRAMINE HCL 50 MG/ML IJ SOLN: 12.5000 mg | Freq: Four times a day (QID) | INTRAMUSCULAR | Status: DC | PRN

## 2013-11-06 MED ORDER — NALOXONE HCL 0.4 MG/ML IJ SOLN: 0.4000 mg | INTRAMUSCULAR | Status: DC | PRN

## 2013-11-06 MED ORDER — OXYCODONE HCL 5 MG/5ML PO SOLN: 5.0000 mg | Freq: Once | ORAL | Status: DC | PRN

## 2013-11-06 MED ORDER — KETOROLAC TROMETHAMINE 15 MG/ML IJ SOLN
15.0000 mg | Freq: Four times a day (QID) | INTRAMUSCULAR | Status: AC
  Administered 2013-11-06 – 2013-11-07 (×3): 15 mg via INTRAVENOUS
  Filled 2013-11-06 (×5): qty 1

## 2013-11-06 MED ORDER — NEOSTIGMINE METHYLSULFATE 10 MG/10ML IV SOLN
INTRAVENOUS | Status: AC
  Filled 2013-11-06: qty 1

## 2013-11-06 MED ORDER — DOCUSATE SODIUM 100 MG PO CAPS
200.0000 mg | ORAL_CAPSULE | Freq: Every day | ORAL | Status: DC
  Administered 2013-11-06 – 2013-11-07 (×2): 200 mg via ORAL
  Filled 2013-11-06 (×2): qty 2

## 2013-11-06 MED ORDER — HYDROMORPHONE HCL PF 1 MG/ML IJ SOLN: 0.2500 mg | INTRAMUSCULAR | Status: DC | PRN

## 2013-11-06 MED ORDER — OXYCODONE HCL 5 MG PO TABS: 5.0000 mg | ORAL_TABLET | Freq: Once | ORAL | Status: DC | PRN

## 2013-11-06 MED ORDER — FENTANYL CITRATE 0.05 MG/ML IJ SOLN
INTRAMUSCULAR | Status: AC
  Filled 2013-11-06: qty 2

## 2013-11-06 MED ORDER — DEXAMETHASONE SODIUM PHOSPHATE 10 MG/ML IJ SOLN
INTRAMUSCULAR | Status: AC
  Filled 2013-11-06: qty 1

## 2013-11-06 MED ORDER — ROCURONIUM BROMIDE 100 MG/10ML IV SOLN
INTRAVENOUS | Status: AC
  Filled 2013-11-06: qty 1

## 2013-11-06 MED ORDER — 0.9 % SODIUM CHLORIDE (POUR BTL) OPTIME
TOPICAL | Status: DC | PRN
  Administered 2013-11-06 (×2): 1000 mL

## 2013-11-06 MED ORDER — FENTANYL CITRATE 0.05 MG/ML IJ SOLN
INTRAMUSCULAR | Status: AC
  Filled 2013-11-06: qty 5

## 2013-11-06 MED ORDER — MORPHINE SULFATE (PF) 1 MG/ML IV SOLN
INTRAVENOUS | Status: DC
  Administered 2013-11-06: 1 mg via INTRAVENOUS
  Administered 2013-11-06: 14:00:00 via INTRAVENOUS
  Administered 2013-11-07: 3 mg via INTRAVENOUS
  Filled 2013-11-06: qty 25

## 2013-11-06 MED ORDER — DEXTROSE 5 % IV SOLN
2.0000 g | Freq: Once | INTRAVENOUS | Status: AC
  Administered 2013-11-06: 2 g via INTRAVENOUS
  Filled 2013-11-06: qty 2

## 2013-11-06 MED ORDER — MIDAZOLAM HCL 2 MG/2ML IJ SOLN
INTRAMUSCULAR | Status: DC | PRN
  Administered 2013-11-06: 2 mg via INTRAVENOUS

## 2013-11-06 MED ORDER — KETOROLAC TROMETHAMINE 30 MG/ML IJ SOLN
INTRAMUSCULAR | Status: AC
  Filled 2013-11-06: qty 1

## 2013-11-06 MED ORDER — ONDANSETRON HCL 4 MG/2ML IJ SOLN
INTRAMUSCULAR | Status: AC
  Filled 2013-11-06: qty 2

## 2013-11-06 MED ORDER — LIDOCAINE HCL (CARDIAC) 20 MG/ML IV SOLN
INTRAVENOUS | Status: DC | PRN
  Administered 2013-11-06: 80 mg via INTRAVENOUS

## 2013-11-06 MED ORDER — MENTHOL 3 MG MT LOZG: 1.0000 | LOZENGE | OROMUCOSAL | Status: DC | PRN

## 2013-11-06 MED ORDER — PROPOFOL 10 MG/ML IV EMUL
INTRAVENOUS | Status: AC
  Filled 2013-11-06: qty 20

## 2013-11-06 MED ORDER — ROCURONIUM BROMIDE 100 MG/10ML IV SOLN
INTRAVENOUS | Status: DC | PRN
  Administered 2013-11-06: 10 mg via INTRAVENOUS
  Administered 2013-11-06: 40 mg via INTRAVENOUS

## 2013-11-06 MED ORDER — IBUPROFEN 600 MG PO TABS
600.0000 mg | ORAL_TABLET | Freq: Four times a day (QID) | ORAL | Status: DC | PRN
Start: 2013-11-06 — End: 2013-11-08

## 2013-11-06 MED ORDER — PHENYLEPHRINE HCL 10 MG/ML IJ SOLN
INTRAMUSCULAR | Status: DC | PRN
  Administered 2013-11-06 (×2): 40 ug via INTRAVENOUS

## 2013-11-06 SURGICAL SUPPLY — 71 items
ADH SKN CLS APL DERMABOND .7 (GAUZE/BANDAGES/DRESSINGS) ×3
APL SKNCLS STERI-STRIP NONHPOA (GAUZE/BANDAGES/DRESSINGS) ×3
BENZOIN TINCTURE PRP APPL 2/3 (GAUZE/BANDAGES/DRESSINGS) ×5 IMPLANT
BLADE 11 SAFETY STRL DISP (BLADE) ×5 IMPLANT
CANISTER SUCT 3000ML (MISCELLANEOUS) ×5 IMPLANT
CATH FOLEY 2WAY SLVR  5CC 18FR (CATHETERS) ×2
CATH FOLEY 2WAY SLVR 5CC 18FR (CATHETERS) ×3 IMPLANT
CATH FOLEY 3WAY 30CC 18FR (CATHETERS) IMPLANT
CLOSURE WOUND 1/2 X4 (GAUZE/BANDAGES/DRESSINGS) ×1
CLOTH BEACON ORANGE TIMEOUT ST (SAFETY) ×5 IMPLANT
CONT PATH 16OZ SNAP LID 3702 (MISCELLANEOUS) IMPLANT
DECANTER SPIKE VIAL GLASS SM (MISCELLANEOUS) ×5 IMPLANT
DERMABOND ADVANCED (GAUZE/BANDAGES/DRESSINGS) ×2
DERMABOND ADVANCED .7 DNX12 (GAUZE/BANDAGES/DRESSINGS) ×3 IMPLANT
DEVICE CAPIO SLIM SINGLE (INSTRUMENTS) IMPLANT
DISSECTOR SPONGE CHERRY (GAUZE/BANDAGES/DRESSINGS) ×15 IMPLANT
DRAPE HYSTEROSCOPY (DRAPE) ×5 IMPLANT
DRAPE UNDERBUTTOCKS STRL (DRAPE) ×5 IMPLANT
DRAPE WARM FLUID 44X44 (DRAPE) IMPLANT
DRSG OPSITE POSTOP 4X10 (GAUZE/BANDAGES/DRESSINGS) ×5 IMPLANT
DRSG TEGADERM 2.38X2.75 (GAUZE/BANDAGES/DRESSINGS) ×5 IMPLANT
DRSG TELFA 3X8 NADH (GAUZE/BANDAGES/DRESSINGS) ×5 IMPLANT
ELECT BLADE 6 FLAT ULTRCLN (ELECTRODE) ×5 IMPLANT
FILTER STRAW FLUID ASPIR (MISCELLANEOUS) IMPLANT
GAUZE PACKING 2X5 YD STRL (GAUZE/BANDAGES/DRESSINGS) IMPLANT
GAUZE SPONGE 4X4 16PLY XRAY LF (GAUZE/BANDAGES/DRESSINGS) ×5 IMPLANT
GLOVE BIO SURGEON STRL SZ 6.5 (GLOVE) ×16 IMPLANT
GLOVE BIO SURGEONS STRL SZ 6.5 (GLOVE) ×4
GLOVE BIOGEL PI IND STRL 6.5 (GLOVE) ×3 IMPLANT
GLOVE BIOGEL PI INDICATOR 6.5 (GLOVE) ×2
GOWN STRL REUS W/TWL LRG LVL3 (GOWN DISPOSABLE) ×20 IMPLANT
GRAFT Y-MESH ALYTE (Mesh General) ×5 IMPLANT
NEEDLE HYPO 22GX1.5 SAFETY (NEEDLE) ×5 IMPLANT
NEEDLE MAYO 6 CRC TAPER PT (NEEDLE) IMPLANT
NS IRRIG 1000ML POUR BTL (IV SOLUTION) ×5 IMPLANT
PACK ABDOMINAL GYN (CUSTOM PROCEDURE TRAY) IMPLANT
PACK VAGINAL WOMENS (CUSTOM PROCEDURE TRAY) ×5 IMPLANT
PLUG CATH AND CAP STER (CATHETERS) ×5 IMPLANT
SET CYSTO W/LG BORE CLAMP LF (SET/KITS/TRAYS/PACK) ×5 IMPLANT
SHEET LAVH (DRAPES) ×5 IMPLANT
SLING TVT EXACT (Sling) ×5 IMPLANT
SPONGE GAUZE 4X4 12PLY STER LF (GAUZE/BANDAGES/DRESSINGS) ×10 IMPLANT
SPONGE LAP 18X18 X RAY DECT (DISPOSABLE) ×10 IMPLANT
SPONGE LAP 4X18 X RAY DECT (DISPOSABLE) ×5 IMPLANT
SPONGE SURGIFOAM ABS GEL 12-7 (HEMOSTASIS) IMPLANT
STAPLER VISISTAT 35W (STAPLE) IMPLANT
STRIP CLOSURE SKIN 1/2X4 (GAUZE/BANDAGES/DRESSINGS) ×4 IMPLANT
SURGIFLO TRUKIT (HEMOSTASIS) IMPLANT
SUT CAPIO ETHIBPND (SUTURE) IMPLANT
SUT ETHIBOND 0 (SUTURE) ×30 IMPLANT
SUT PDS AB 0 CT1 27 (SUTURE) IMPLANT
SUT VIC AB 0 CT1 18XCR BRD8 (SUTURE) ×6 IMPLANT
SUT VIC AB 0 CT1 27 (SUTURE) ×20
SUT VIC AB 0 CT1 27XBRD ANBCTR (SUTURE) ×12 IMPLANT
SUT VIC AB 0 CT1 8-18 (SUTURE) ×10
SUT VIC AB 2-0 CT1 (SUTURE) ×5 IMPLANT
SUT VIC AB 2-0 CT1 27 (SUTURE)
SUT VIC AB 2-0 CT1 TAPERPNT 27 (SUTURE) IMPLANT
SUT VIC AB 2-0 CT2 27 (SUTURE) IMPLANT
SUT VIC AB 2-0 SH 27 (SUTURE) ×30
SUT VIC AB 2-0 SH 27XBRD (SUTURE) ×18 IMPLANT
SUT VIC AB 2-0 UR6 27 (SUTURE) IMPLANT
SYR 30ML LL (SYRINGE) IMPLANT
SYR 50ML LL SCALE MARK (SYRINGE) ×5 IMPLANT
TOWEL OR 17X24 6PK STRL BLUE (TOWEL DISPOSABLE) ×10 IMPLANT
TRAY FOLEY BAG SILVER LF 14FR (CATHETERS) IMPLANT
TRAY FOLEY BAG SILVER LF 16FR (CATHETERS) ×5 IMPLANT
TRAY FOLEY CATH 14FR (SET/KITS/TRAYS/PACK) ×5 IMPLANT
TUBING CONNECTING 10 (TUBING) ×4 IMPLANT
TUBING CONNECTING 10' (TUBING) ×1
WATER STERILE IRR 1000ML POUR (IV SOLUTION) ×5 IMPLANT

## 2013-11-06 NOTE — Transfer of Care (Signed)
Immediate Anesthesia Transfer of Care Note  Patient: [name]  Procedure(s) Performed: Procedure(s): ABDOMINO SACROCOLPOPEXY with Halban's culdoplasty (N/A) ANTERIOR (CYSTOCELE) AND POSTERIOR REPAIR (RECTOCELE) (N/A) TRANSVAGINAL TAPE (TVT) PROCEDURE exact midurethral sling (N/A) CYSTOSCOPY (N/A)  Patient Location: PACU  Anesthesia Type:General  Level of Consciousness: awake, alert  and oriented  Airway & Oxygen Therapy: Patient Spontanous Breathing and Patient connected to nasal cannula oxygen  Post-op Assessment: Report given to PACU RN, Post -op Vital signs reviewed and stable and Patient moving all extremities  Post vital signs: Reviewed and stable  Complications: No apparent anesthesia complications

## 2013-11-06 NOTE — Brief Op Note (Signed)
11/06/2013  12:54 PM  PATIENT:  [name]  63 y.o. female  PRE-OPERATIVE DIAGNOSIS:  Incomplete vaginal prolapse, genuine stress incontinence  POST-OPERATIVE DIAGNOSIS: Incomplete vaginal prolapse, genuine stress incontinence  PROCEDURE:  ABDOMINAL SACROCOLPOPEXY, TVT EXACT MIDURETHRAL SLING, CYSTOSCOPY, ANTERIOR AND POSTERIOR COLPORRHAPHY, ENTEROCELE REPAIR.  SURGEON:  Surgeon(s) and Role:    * Brook E Amundson de Berton Lan, MD - Primary    * Lyman Speller, MD - Assisting  PHYSICIAN ASSISTANT:   ASSISTANTS:  Lyman Speller, MD   ANESTHESIA:   local and general  EBL:  Total I/O In: 2200 [I.V.:2200] Out: 475 [Urine:325; Blood:150]  BLOOD ADMINISTERED:none  DRAINS: Urinary Catheter (Foley)   LOCAL MEDICATIONS USED: 1% LIDOCAINE with epinephrine 1:100,000 and Amount:  20 ml  SPECIMEN:  No Specimen  DISPOSITION OF SPECIMEN:  N/A  COUNTS:  YES  TOURNIQUET:  * No tourniquets in log *  DICTATION: .Other Dictation: Dictation Number    PLAN OF CARE: Admit to inpatient   PATIENT DISPOSITION:  PACU - hemodynamically stable.   Delay start of Pharmacological VTE agent (>24hrs) due to surgical blood loss or risk of bleeding: not applicable

## 2013-11-06 NOTE — Progress Notes (Signed)
Update to History and Physical  No marked change in status.  OK to proceed with surgery.

## 2013-11-06 NOTE — Anesthesia Postprocedure Evaluation (Signed)
  Anesthesia Post-op Note  Patient: [name]  Procedure(s) Performed: Procedure(s): ABDOMINO SACROCOLPOPEXY with Halban's culdoplasty (N/A) ANTERIOR (CYSTOCELE) AND POSTERIOR REPAIR (RECTOCELE) (N/A) TRANSVAGINAL TAPE (TVT) PROCEDURE exact midurethral sling (N/A) CYSTOSCOPY (N/A)  Patient Location: [facility]  Anesthesia Type:General  Level of Consciousness: awake  Airway and Oxygen Therapy: Patient Spontanous Breathing and Patient connected to nasal cannula oxygen  Post-op Pain: mild  Post-op Assessment: Patient's Cardiovascular Status Stable and Respiratory Function Stable  Post-op Vital Signs: stable  Last Vitals:  Filed Vitals:   11/06/13 1409  BP: 127/69  Pulse: 69  Temp: 36.6 C  Resp: 20    Complications: No apparent anesthesia complications

## 2013-11-06 NOTE — Addendum Note (Signed)
Addendum created 11/06/13 1509 by Ignacia Bayley, CRNA   Modules edited: Notes Section   Notes Section:  File: [id number]

## 2013-11-06 NOTE — Anesthesia Postprocedure Evaluation (Signed)
Anesthesia Post Note  Patient: [name]  Procedure(s) Performed: Procedure(s) (LRB): ABDOMINO SACROCOLPOPEXY with Halban's culdoplasty (N/A) ANTERIOR (CYSTOCELE) AND POSTERIOR REPAIR (RECTOCELE) (N/A) TRANSVAGINAL TAPE (TVT) PROCEDURE exact midurethral sling (N/A) CYSTOSCOPY (N/A)  Anesthesia type: GA  Patient location: PACU  Post pain: Pain level controlled  Post assessment: Post-op Vital signs reviewed  Last Vitals:  Filed Vitals:   11/06/13 1300  BP: 122/56  Pulse: 73  Temp:   Resp: 16    Post vital signs: Reviewed  Level of consciousness: sedated  Complications: No apparent anesthesia complications

## 2013-11-06 NOTE — OR Nursing (Signed)
Per Dr. Quincy Simmonds, updated patient's spouse.  He verbalized understanding of our conversation.

## 2013-11-06 NOTE — Progress Notes (Signed)
Day of Surgery Procedure(s) (LRB): ABDOMINO SACROCOLPOPEXY with Halban's culdoplasty (N/A) ANTERIOR (CYSTOCELE) AND POSTERIOR REPAIR (RECTOCELE) (N/A) TRANSVAGINAL TAPE (TVT) PROCEDURE exact midurethral sling (N/A) CYSTOSCOPY (N/A)  Subjective: Patient reports thirsty. No pain.  No nausea.  Objective: I have reviewed patient's vital signs and intake and output.  General: alert Resp: clear to auscultation bilaterally Cardio: regular rate and rhythm, S1, S2 normal, no murmur, click, rub or gallop GI: soft, non-tender; bowel sounds normal; no masses,  no organomegaly and incision: bloody drainage present Extremities:  PAS and Ted hose on.  DPs 2+ bilaterally.  Vaginal Bleeding: none Honeycomb dressing with blood stained steristrips.  No active blleeding.  Suprapubic dressings dry and intact.    Assessment: s/p Procedure(s): ABDOMINO SACROCOLPOPEXY with Halban's culdoplasty (N/A) ANTERIOR (CYSTOCELE) AND POSTERIOR REPAIR (RECTOCELE) (N/A) TRANSVAGINAL TAPE (TVT) PROCEDURE exact midurethral sling (N/A) CYSTOSCOPY (N/A): stable  Plan: Continue foley due to  post operative state. Clear liquids Repeat dose of Cefotetan 2 gm this evening at 6 pm.  CBC and CMP in am.  Discussed surgical findings and treatment.  Questions answered.   LOS: 0 days    [name] 11/06/2013, 4:43 PM

## 2013-11-06 NOTE — Anesthesia Preprocedure Evaluation (Signed)
Anesthesia Evaluation  Patient identified by MRN, date of birth, ID band Patient awake    Reviewed: Allergy & Precautions, H&P , NPO status , Patient's Chart, lab work & pertinent test results  History of Anesthesia Complications Negative for: history of anesthetic complications  Airway Mallampati: II TM Distance: >3 FB Neck ROM: Full    Dental  (+) Teeth Intact   Pulmonary neg sleep apnea, neg COPDneg recent URI, former smoker,          Cardiovascular negative cardio ROS  Rhythm:Regular     Neuro/Psych negative neurological ROS  negative psych ROS   GI/Hepatic negative GI ROS, Neg liver ROS,   Endo/Other  negative endocrine ROS  Renal/GU negative Renal ROS     Musculoskeletal   Abdominal   Peds  Hematology negative hematology ROS (+)   Anesthesia Other Findings   Reproductive/Obstetrics                           Anesthesia Physical Anesthesia Plan  ASA: II  Anesthesia Plan: General   Post-op Pain Management:    Induction: Intravenous  Airway Management Planned: Oral ETT  Additional Equipment: None  Intra-op Plan:   Post-operative Plan: Extubation in OR  Informed Consent: I have reviewed the patients History and Physical, chart, labs and discussed the procedure including the risks, benefits and alternatives for the proposed anesthesia with the patient or authorized representative who has indicated his/her understanding and acceptance.   Dental advisory given  Plan Discussed with: CRNA and Surgeon  Anesthesia Plan Comments:         Anesthesia Quick Evaluation

## 2013-11-07 ENCOUNTER — Encounter (HOSPITAL_COMMUNITY): Payer: Self-pay | Admitting: Obstetrics and Gynecology

## 2013-11-07 LAB — CBC
HEMATOCRIT: 35 % — AB (ref 36.0–46.0)
Hemoglobin: 11.9 g/dL — ABNORMAL LOW (ref 12.0–15.0)
MCH: 32.6 pg (ref 26.0–34.0)
MCHC: 34 g/dL (ref 30.0–36.0)
MCV: 95.9 fL (ref 78.0–100.0)
Platelets: 283 10*3/uL (ref 150–400)
RBC: 3.65 MIL/uL — AB (ref 3.87–5.11)
RDW: 13.5 % (ref 11.5–15.5)
WBC: 8.9 10*3/uL (ref 4.0–10.5)

## 2013-11-07 LAB — BASIC METABOLIC PANEL
BUN: 7 mg/dL (ref 6–23)
CHLORIDE: 106 meq/L (ref 96–112)
CO2: 28 meq/L (ref 19–32)
Calcium: 8.7 mg/dL (ref 8.4–10.5)
Creatinine, Ser: 0.58 mg/dL (ref 0.50–1.10)
GFR calc Af Amer: >90 mL/min (ref >90)
GFR calc non Af Amer: >90 mL/min (ref >90)
GLUCOSE: 101 mg/dL — AB (ref 70–99)
Potassium: 4.2 mEq/L (ref 3.7–5.3)
SODIUM: 142 meq/L (ref 137–147)

## 2013-11-07 NOTE — Progress Notes (Signed)
1 Day Post-Op Procedure(s) (LRB): ABDOMINO SACROCOLPOPEXY with Halban's culdoplasty (N/A) ANTERIOR (CYSTOCELE) AND POSTERIOR REPAIR (RECTOCELE) (N/A) TRANSVAGINAL TAPE (TVT) PROCEDURE exact midurethral sling (N/A) CYSTOSCOPY (N/A)  Subjective: Patient reports tolerated clear liquids. Ambulating.  Did not use PCA last night.  (Received Toradol.) Foley and vaginal packing out.   Objective: I have reviewed patient's vital signs, intake and output and labs. Hgb 11.9.  UO 3475 cc.   General: alert Resp: clear to auscultation bilaterally Cardio: regular rate and rhythm, S1, S2 normal, no murmur, click, rub or gallop GI: soft, non-tender; bowel sounds normal; no masses,  no organomegaly and incision:  Dressing on.  Small amount of dried blood.  Suprapubic dressings dry.  Vaginal Bleeding: minimal  Assessment: s/p Procedure(s): ABDOMINO SACROCOLPOPEXY with Halban's culdoplasty (N/A) ANTERIOR (CYSTOCELE) AND POSTERIOR REPAIR (RECTOCELE) (N/A) TRANSVAGINAL TAPE (TVT) PROCEDURE exact midurethral sling (N/A) CYSTOSCOPY (N/A): progressing well  Plan: Advance diet Encourage ambulation Advance to PO medication  bladder training today.  Anticipate discharge tomorrow.   LOS: 1 day    [name] 11/07/2013, 8:15 AM

## 2013-11-07 NOTE — Op Note (Signed)
NAMEVELEDA, MUN NO.:  [id number]  MEDICAL RECORD NO.:  [id number]  LOCATION:  9303                          FACILITY:  [facility]  PHYSICIAN:  Lenard Galloway, M.D.   DATE OF BIRTH:  02-Jan-1951  DATE OF PROCEDURE:  11/06/2013 DATE OF DISCHARGE:                              OPERATIVE REPORT   PREOPERATIVE DIAGNOSES:  Incomplete vaginal prolapse, genuine stress incontinence.  POSTOPERATIVE DIAGNOSES:  Incomplete vaginal prolapse, genuine stress incontinence, extensive pelvic adhesions.  PROCEDURE:  Abdominal sacrocolpopexy, tension-free vaginal tape exact mid urethral sling, cystoscopy, anterior and posterior colporrhaphy, enterocele repair, lysis of adhesions.  SURGEON:  Lenard Galloway, M.D.  ASSISTANT:  Felipa Emory, MD.  ANESTHESIA:  General endotracheal, local with 1% lidocaine with epinephrine 1:100,000.  IV FLUIDS:  2200 mL Ringer's lactate.  ESTIMATED BLOOD LOSS:  150 mL.  URINE OUTPUT:  325 mL.  COMPLICATIONS:  None.  INDICATIONS FOR PROCEDURE:  The patient is a 63 year old female gravida 3, para 39 Caucasian female, status post total abdominal hysterectomy with bilateral salpingo-oophorectomy in 2006 for an ovulatory uterine bleeding and a benign dermoid cyst, who presented with vaginal prolapse and urinary incontinence.  The patient reported loss of urine with activities causing increase in abdominal pressure.  She has a channel urodynamic testing in the office which confirmed the presence of genuine stress incontinence.  The patient also had evidence of pelvic organ prolapse on exam and she wished for surgical treatment of the above. The patient presents now for an abdominal sacral colpopexy with TVT exact mid urethral sling and cystoscopy along with an anterior and posterior colporrhaphy.  Risks, benefits, and alternatives are reviewed with the patient who wishes to proceed.  FINDINGS:  Examination under anesthesia revealed a  second-degree cystocele.  There is a first-degree vaginal apical prolapse.  There is evidence of third-degree mid vaginal rectocele.  The vaginal tissues themselves are noted to have a very elastic quality to them.  At termination of the procedure for the vaginal portion of the repair, the patient was actually noted to have an enterocele.  Intra-abdominally, the patient had extensive adhesions from her prior hysterectomy.  Small and large bowel were adherent to the vaginal call. The sigmoid colon was densely adherent to the left pelvic sidewall and this was thought to be consistent with a prior history of diverticulitis.  The uterus, cervix, bilateral tubes and ovaries were absent.  The appendix appeared to be normal.  There was omentum which was adherent to the anterior abdominal wall.  Cystoscopy with placement of the sling and at the termination of the entire procedure documented the ureters to be patent bilaterally.  The bladder was visualized throughout 360 degrees including the bladder dome and trigone and these were normal.  There was no evidence of a foreign body in the bladder or the urethra.  SPECIMENS:  None.  DESCRIPTION OF PROCEDURE:  The patient was reidentified in the preoperative area.  She did receive cefotetan 2 g IV for antibiotic prophylaxis.  She received both TED hose and PAS stockings for DVT prophylaxis.  In the operating room, the patient was placed in a supine position on the table.  She received general  endotracheal anesthesia. She was then placed in the dorsal lithotomy position in [place]. The abdomen, vagina, and perineum were then sterilely prepped and the patient was draped.  An examination under anesthesia was performed.  The patient received a Foley catheter, which remained to gravity drainage throughout the procedure and at termination of the procedure.  The procedure began by creating a Pfannenstiel incision sharply with a scalpel.  This  was carried down to subcutaneous layer using monopolar cautery.  The fascia was incised in the midline with a scalpel and the incision was extended bilaterally with the Mayo scissors.  Sharp dissection was used to dissect the rectus muscles off the fascia superiorly and inferiorly.  The rectus muscles were sharply divided in the midline.  The parietal peritoneum was elevated with 2 hemostat clamps and was entered sharply.  The peritoneal incision was extended cranially and caudally.  The omental adhesions to the anterior abdominal wall were lysed using monopolar cautery.  The extensive adhesions to the vaginal cuff into the left pelvic sidewall were systematically and carefully lysed using sharp dissection with a Metzenbaum scissors and blunt dissection with a Kitner where the adhesions were more mild in nature.  The total time for lysis of adhesions required approximately 1 and 1-1/2 hours.  The adhesions were most dense between the sigmoid colon on the left pelvic sidewall, and these needed to be lysed in order to create mobility of the sigmoid colon and bring it up and out of the pelvis in order to have good access to the vaginal cuff for the abdominal sacral colpopexy.  An EEA sizer was placed inside the vagina.  The vaginal cuff was identified.  The peritoneum overlying the cuff was elevated with 2 smooth pickups and the peritoneum overlying the vaginal cuff was incised in a cranial and caudal fashion.  A combination of sharp and blunt dissection were used to dissect the peritoneum off the overlying vagina. This was first addressed anteriorly in order to bring the bladder off the anterior cuff and then posteriorly to gain access to the posterior vaginal wall.  The rectovaginal space was opened again sharply using the Metzenbaum scissors.  A vertical extension of the peritoneal incision was extended into the cul-de-sac at this time to allow for later reperitonealization of the  mesh.  Attention was turned to the sacrum at this time where the sacral promontory on the right and left ureters were identified along with the iliac arteries and veins.  The peritoneum overlying the sacral promontory was elevated with 2 smooth pickups and a vertical incision was created in the peritoneum.  A careful and meticulous dissection over the promontory was performed with a Metzenbaum scissors and with a Kitner.  The peritoneal incision was extended down the right pelvic sidewall, and medial to the ureter on this side.  The peritoneal incision was connected to the incision extending down into the posterior cul-de-sac.  The anterior longitudinal ligament overlying the promontory was identified at this time.  Two simple sutures of 0 Ethibond were then placed in the anterior longitudinal ligament.  One to the right and 1 to the left of the midline.  The middle sacral vessels were identified. The care was taken to avoid them.  The Alyte Y-shaped mesh was then placed.  The mesh was trimmed appropriately.  It was rinsed gently in saline.  The mesh was sewn using a simple sutures of 0 Ethibond.  Four securing points were placed on the anterior vagina and 4 securing  points were placed on the posterior vagina.  The EEA sizer was removed at this time and gloved hand was placed inside the vagina, and the amount of vaginal support was calculated and the mesh was marked for its attachments side on the sacral promontory.  The sutures at the sacral promontory were through the mesh and the mesh was carefully tied to the promontory with care taken to avoid any other structures.  Any excess mesh was trimmed at this time and the mesh was reperitonealized with a running suture of 2-0 Vicryl.  There was 100% coverage of the mesh.  The pelvis was irrigated and suctioned and noted to be hemostatic.  The self-retaining retractor which had been placed inside the pelvis was removed at this time  and all of the lap pads which were used to pack the bowel into the upper abdomen were also removed.  There were still some omental adhesions to the left abdominal wall, and these were lysed using monopolar cautery for good hemostasis.  The abdomen was closed at this time.  The parietal peritoneum was closed with a running suture of 2-0 Vicryl.  The rectus muscles were re- supported in the midline with interrupted sutures of 0 Vicryl.  The fascia was closed with a running suture of 0 Vicryl.  The subcutaneous layer was irrigated, suctioned, and made hemostatic with monopolar cautery.  Interrupted sutures of 2-0 plain gut were placed in the subcutaneous layer.  The skin was closed with a subcuticular suture of 4- 0 Vicryl.  Steri-Strips and benzoin were placed over the incision and the incision was covered with a sterile towel and then a half drape.  Attention was turned at this time to the vaginal portion of the procedure.  The patient was placed in the high lithotomy position.  A Deaver retractor was placed inside the vagina.  The anterior vaginal wall was then marked with Allis clamps in the midline.  The mucosa was injected locally with 1% lidocaine with epinephrine 1:100,000.  The vaginal mucosa was then incised vertically with a scalpel from a level of 1 cm below the urethral meatus and down over a distance of 5 cm.  A sharp and blunt dissection were used to dissect the vaginal mucosa off the overlying vaginal tissue and bladder.  This was performed bilaterally.  The dissection was carried back to the level of the pubic rami.  Hemostasis was created with monopolar cautery.  A 1 cm suprapubic incisions were then created 2 cm to the right and left to the midline.  The Foley catheter was removed.  The Foley catheter tip was in obturator guide was then placed in the urethra.  The TVT exact was performed in a bottom up fashion.  The needle guide and sheath were placed first on the  patient's right-hand side.  The needle was brought through the right retropubic space and then up through the right suprapubic incision, all while deflecting the urethra away from the pathway of the needle.  The same was performed on the patient's left- hand side again deflecting the urethra properly.  The sling was brought up through the suprapubic incisions bilaterally.  A Kelly clamp was placed between the sling and the urethra as the plastic sheaths were removed.  Excess sling was trimmed suprapubically.  The sling was noted to be in good position.  An anterior colporrhaphy was performed at this time by placing vertical mattress sutures of 2-0 Vicryl for excellent reduction of the cystocele.  Excess vaginal  mucosa was trimmed and the anterior vaginal wall was closed with a running locked suture of 2-0 Vicryl.  Attention was turned to the posterior vaginal wall at this time, which was marked with Allis clamps  along the perineal body and then the posterior vaginal mucosa.  The mucosa was injected locally with 1% lidocaine with epinephrine 1:100,000.  A triangular wedge of epithelium was excised from the perineal body and the posterior vaginal mucosa was then incised vertically in the midline with the Metzenbaum scissors. Sharp and blunt dissection were used to dissect the rectovaginal fascia off the overlying vaginal mucosa.  A clear enterocele was encountered at this time.  The enterocele sac was dissected off the underlying vaginal mucosa bilaterally.  An attempt was made to enter the enterocele sac, but this could not be easily performed.  Pursestring sutures of 2-0 Vicryl were used to gently reduce the enterocele.  Colporrhaphy sutures then of 0 Vicryl were used to reduce the enterocele and rectocele further.  Excess vaginal mucosa was trimmed and the posterior vaginal mucosa was then closed with a running locked suture of 2-0 Vicryl.  The suture was brought down to the level of  the hymen at this time and then a crown stitch of 0 Vicryl was placed.  The remainder of the posterior vaginal wall closer was performed as for an episiotomy repair with the suture was brought back behind the hymen, down across the superficial perineal muscles in a running fashion and that of the perineal body in a subcuticular fashion with the knot tied at the hymen.  The Foley catheter was removed at this time and final cystoscopy was performed.  The findings are as noted above.  Rectal examination documented the absence of any sutures in the rectum.  The suprapubic incisions were closed with subcuticular sutures of 4-0 Vicryl.  Dermabond was placed over the incisions.  The vaginal packing with Estrace was placed inside the vagina.  Sterile bandages were placed over all of the incisions.  The patient was cleansed of any remaining Betadine.  She was awakened and extubated.  She was escorted to the recovery room in stable condition.  There were no complications to the procedure.  All needle, instrument, and sponge counts were correct.     Lenard Galloway, M.D.     BES/MEDQ  D:  11/06/2013  T:  11/07/2013  Job:  [id number]

## 2013-11-07 NOTE — Progress Notes (Signed)
Ur chart review completed.  

## 2013-11-07 NOTE — Progress Notes (Signed)
Pt continues to do voiding trail. Dr. Quincy Simmonds made aware of pt."s progress. Orders were received and  Carried.# 14 french foley was inserted as ordered. Pt  Tolerated procedure well 500cc of urine was expelled from catheter. Repositioned for comfort.

## 2013-11-07 NOTE — Progress Notes (Signed)
Patient's vaginal packing removed. Minimal drainage, no clots present. Patient tolerated well.

## 2013-11-08 ENCOUNTER — Other Ambulatory Visit: Payer: Self-pay | Admitting: Obstetrics and Gynecology

## 2013-11-08 MED ORDER — CIPROFLOXACIN HCL 250 MG PO TABS: ORAL_TABLET | ORAL | Status: DC

## 2013-11-08 NOTE — Progress Notes (Signed)
GYN Final post op note  Patient had voiding trials and had voids of 100 and 225 cc and then a residual of 438 cc. Foley catheter replaced with a leg bag.  Will be discharged now to home with a catheter and a leg bag.  Ciprofloxacin 250 mg po q day until foley removed. Follow up in 4 days.

## 2013-11-08 NOTE — Progress Notes (Signed)
Pt is discharged in the care of husband. Downstairs per wheelchair with N.T. Escort.Denies any pain or discomfort. Discharge instructions with Rx were given to pt,with good understanding .Questions were asked and answered. Stable.After bladder training Difficulty  # 14 foley was reinserted  As ordered. Pt given home care instructions on leg bag and emptying foley Understanding all instructions well.

## 2013-11-08 NOTE — Discharge Instructions (Signed)
Anterior and Posterior Colporrhaphy, Sling Procedure, Care After Refer to this sheet in the next few weeks. These instructions provide you with information on caring for yourself after your procedure. Your health care provider may also give you more specific instructions. Your treatment has been planned according to current medical practices, but problems sometimes occur. Call your health care provider if you have any problems or questions after your procedure.  HOME CARE INSTRUCTIONS  Rest as much as possible during the first 2 weeks after the procedure.   Avoid heavy lifting (more than 10 pounds [4.5 kg]), pushing, or pulling. Limit stair climbing to once or twice a day the first week, then slowly increase this activity.   Avoid standing for prolonged periods of time.   Talk with your health care provider about when you may resume your usual physical activity.   You may resume your normal diet right away.   Drink at least 6-8 glasses of non-caffeinated beverages per day.   Eat a well-balanced diet. Daily portions of food from the meat (protein), milk, fruit, vegetable, and bread families are necessary for your health.   Your normal bowel function should return. If you become constipated, you may:   Take a mild laxative.  Add fruit and bran to your diet.  Drink more liquids.  You may take a shower and wash your hair.   Only take over-the-counter or prescription medicines as directed by your health care provider.   Clean the incision with water. Do not use a dressing unless the incision is draining or irritated. Check your incision daily for redness, draining, swelling, or separation of the skin.   Follow any bladder care instructions provided by your health care provider.   Keep your perineal area (the area between vagina and rectum) clean and dry. Perform perineal care after every bowel movement and each time you urinate. You may take a sitz bath or sit in a tub of  clean, warm water when necessary, unless your health care provider tells you otherwise.   Do not have sexual intercourse until permitted by your health care provider.   Follow up with your health care provider as directed.  SEEK MEDICAL CARE IF:  You have shaking chills.   Your pain is not relieved with medicine or becomes worse.  You have frequent or urgent urination, or you are unable to completely empty your bladder.   You feel a burning sensation when urinating.   You see pus coming from the wounds.  SEEK IMMEDIATE MEDICAL CARE IF:  You develop a fever.  You notice redness, drainage, swelling, or separation of the skin at the incision site.  You have difficulty breathing.  You are unable to urinate. MAKE SURE YOU:   Understand these instructions.  Will watch your condition.  Will get help right away if you are not doing well or get worse. Document Released: 12/16/2003 Document Revised: 01/03/2013 Document Reviewed: 09/22/2012 [facility] Patient Information 2015 [place], [place]. This information is not intended to replace advice given to you by your health care provider. Make sure you discuss any questions you have with your health care provider.  Laparotomy Care After Refer to this sheet in the next few weeks. These instructions provide you with information on caring for yourself after your procedure. Your caregiver may also give you more specific instructions. Your treatment has been planned according to current medical practices, but problems sometimes occur. Call your caregiver if you have any problems or questions after your procedure. HOME  CARE INSTRUCTIONS ACTIVITY  Rest as much as possible the first two weeks at home.  Avoid strenuous activity such as heavy lifting (more than 10 pounds), pushing, or pulling. Limit stair climbing to once or twice a day for the first week, then slowly increase this activity.  Take frequent rest periods throughout the  day.  Talk with your caregiver about when you may resume your usual physical activity.  You need to be out of bed and walking as much as possible. This decreases the chance of:  Blood clots.  Pneumonia. NUTRITION  You can resume your normal diet once you regain bowel function.  Drink plenty of fluids (6-8 glasses a day or as instructed by your caregiver).  Eat a well-balanced diet.  Daily portions of food from the meat (protein), milk, vegetable, and bread groups are necessary for your health. ELIMINATION It is very important not to strain during bowel movements. If constipation should occur, you may:  Take a mild laxative.  Add fruit and bran to your diet.  Drink more fluids. HYGIENE  Take showers, not baths, until 4-6 weeks after surgery.  If your incision is closed, you may take a shower or tub bath. FEVER If you feel feverish or have shaking chills, take your temperature. If it is 102 F (38.9 C), call your caregiver. The fever may mean there is an infection. PAIN CONTROL  Mild discomfort may occur.  Only take over-the-counter or prescription medicines for pain, discomfort, or fever as directed by your caregiver. Take any prescribed medicines exactly as directed. INCISION CARE  Keep your incision site clean with soap and water.  Do not use a dressing unless your cut (incision) from surgery is draining or irritated.  If you have small adhesive strips in place and they do not fall off within 10 days, carefully peel them off.  Check your incision and surrounding area daily for any redness, swelling, discoloration, heavy drainage, or separation of the skin. SEXUAL INTERCOURSE Do not have sexual intercourse until after your follow-up appointment, unless your caregiver tells you otherwise. SEEK MEDICAL CARE IF:   You are unable to tolerate food or drinks.  You are unable to pass gas or have a bowel movement.  Your pain becomes more severe or is not relieved with  medicines.  You have redness, swelling, discoloration, heavy drainage, or separation of the skin at the incision site. Document Released: 12/16/2003 Document Revised: 04/19/2012 Document Reviewed: 05/02/2007 [facility] Patient Information 2015 [place], [place]. This information is not intended to replace advice given to you by your health care provider. Make sure you discuss any questions you have with your health care provider.

## 2013-11-08 NOTE — Progress Notes (Signed)
2 Days Post-Op Procedure(s) (LRB): ABDOMINO SACROCOLPOPEXY with Halban's culdoplasty (N/A) ANTERIOR (CYSTOCELE) AND POSTERIOR REPAIR (RECTOCELE) (N/A) TRANSVAGINAL TAPE (TVT) PROCEDURE exact midurethral sling (N/A) CYSTOSCOPY (N/A)  Subjective: Patient reports tolerating PO and + flatus.   Foley catheter replaced yesterday when patient had repeated small voids. Had 500 cc residual.   Objective: I have reviewed patient's vital signs and intake and output. Urine output 6225 cc on 11/07/13.  General: alert Resp: clear to auscultation bilaterally Cardio: regular rate and rhythm, S1, S2 normal, no murmur, click, rub or gallop GI: soft, non-tender; bowel sounds normal; no masses,  no organomegaly and incision:  Dried blood on steristrips.  No active bleeding.   Suprapubic ecchymoses with no induration.  Intact incisions.  Vaginal Bleeding: minimal  Assessment: s/p Procedure(s): ABDOMINO SACROCOLPOPEXY with Halban's culdoplasty (N/A) ANTERIOR (CYSTOCELE) AND POSTERIOR REPAIR (RECTOCELE) (N/A) TRANSVAGINAL TAPE (TVT) PROCEDURE exact midurethral sling (N/A) CYSTOSCOPY (N/A): progressing well  Plan: Discharge home  Will do a voiding trial prior to discharge to see if can go home without a catheter. Already has Rx for Percocet and Motrin.  Instructions and precautions given.  Has follow for 4 days in the office.    LOS: 2 days    [name] 11/08/2013, 7:20 AM

## 2013-11-12 ENCOUNTER — Encounter: Payer: Self-pay | Admitting: Obstetrics and Gynecology

## 2013-11-12 ENCOUNTER — Ambulatory Visit (INDEPENDENT_AMBULATORY_CARE_PROVIDER_SITE_OTHER): Payer: 59 | Admitting: Obstetrics and Gynecology

## 2013-11-12 ENCOUNTER — Encounter (HOSPITAL_COMMUNITY): Payer: Self-pay | Admitting: *Deleted

## 2013-11-12 ENCOUNTER — Inpatient Hospital Stay (HOSPITAL_COMMUNITY)
Admission: AD | Admit: 2013-11-12 | Discharge: 2013-11-12 | Disposition: A | Discharge status: Home / Self Care | Payer: 59 | Source: Ambulatory Visit | Attending: Obstetrics and Gynecology | Admitting: Obstetrics and Gynecology

## 2013-11-12 VITALS — BP 132/88 | HR 80 | Resp 16 | Ht 63.0 in | Wt 163.4 lb

## 2013-11-12 DIAGNOSIS — Z9889 Other specified postprocedural states: Secondary | ICD-10-CM

## 2013-11-12 DIAGNOSIS — Z87891 Personal history of nicotine dependence: Secondary | ICD-10-CM | POA: Insufficient documentation

## 2013-11-12 DIAGNOSIS — R339 Retention of urine, unspecified: Secondary | ICD-10-CM | POA: Insufficient documentation

## 2013-11-12 NOTE — MAU Note (Signed)
Patient presents with complaint that she had surgery on Tuesday and had her catheter removed Thursday but has urinary frequency.

## 2013-11-12 NOTE — Discharge Instructions (Signed)
Acute Urinary Retention °Acute urinary retention is the temporary inability to urinate. This is an uncommon problem in women. It can be caused by: °· Infection. °· A side effect of a medicine. °· A problem in a nearby organ that presses or squeezes on the bladder or the urethra (the tube that drains the bladder). °· Psychological problems. °·  Surgery on your bladder, urethra, or pelvic organs that causes obstruction to the outflow of urine from your bladder. °HOME CARE INSTRUCTIONS  °If you are sent home with a Foley catheter and a drainage system, you will need to discuss the best course of action with your health care provider. While the catheter is in, maintain a good intake of fluids. Keep the drainage bag emptied and lower than your catheter. This is so that contaminated urine will not flow back into your bladder, which could lead to a urinary tract infection. °There are two main types of drainage bags. One is a large bag that usually is used at night. It has a good capacity that will allow you to sleep through the night without having to empty it. The second type is called a leg bag. It has a smaller capacity so it needs to be emptied more frequently. However, the main advantage is that it can be attached by a leg strap and goes underneath your clothing, allowing you the freedom to move about or leave your home. °Only take over-the-counter or prescription medicines for pain, discomfort, or fever as directed by your health care provider.  °SEEK MEDICAL CARE IF: °· You develop a low-grade fever. °· You experience spasms or leakage of urine with the spasms. °SEEK IMMEDIATE MEDICAL CARE IF:  °· You develop chills or fever. °· Your catheter stops draining urine. °· Your catheter falls out. °· You start to develop increased bleeding that does not respond to rest and increased fluid intake. °MAKE SURE YOU: °· Understand these instructions. °· Will watch your condition. °· Will get help right away if you are not  doing well or get worse. °Document Released: 05/02/2006 Document Revised: 02/21/2013 Document Reviewed: 10/12/2012 °[employer]® Patient Information ©2015 [employer]. This information is not intended to replace advice given to you by your health care provider. Make sure you discuss any questions you have with your health care provider. ° °

## 2013-11-12 NOTE — Progress Notes (Signed)
Patient ID: [name], female   DOB: [birth year], 63 y.o.   MRN: [id number] GYNECOLOGY  VISIT   HPI: 63 y.o.   Married  Caucasian  female   [phone] with No LMP recorded. Patient has had a hysterectomy.   here for 1 week follow up and to have catheter removed. taking Ciprofloxacin 250 mg po q day.  Bowel movements good.  Stopped Miralax. Still taking Colace.  Not using pain medication. No problems.   GYNECOLOGIC HISTORY: No LMP recorded. Patient has had a hysterectomy. Contraception:  hysterectomy  Menopausal hormone therapy: Vivelle Dot        OB History   Grav Para Term Preterm Abortions TAB SAB Ect Mult Living   3 3 3       3          Patient Active Problem List   Diagnosis Date Noted  . Post-operative state 11/06/2013    Past Medical History  Diagnosis Date  . Bladder tumor 1992    BENIGN  . Fibroid   . Urinary urgency     Past Surgical History  Procedure Laterality Date  . Anterior cruciate ligament repair Right   . Bladder tumor excision  1992    benign  . Total abdominal hysterectomy w/ bilateral salpingoophorectomy  06/15/04    dermoid and AUB  . Abdominal hysterectomy    . Abdominal sacrocolpopexy N/A 11/06/2013    Procedure: ABDOMINO SACROCOLPOPEXY with Halban's culdoplasty;  Surgeon: Jamey Reas de Berton Lan, MD;  Location: [facility];  Service: Gynecology;  Laterality: N/A;  . Anterior and posterior repair N/A 11/06/2013    Procedure: ANTERIOR (CYSTOCELE) AND POSTERIOR REPAIR (RECTOCELE);  Surgeon: Jamey Reas de Berton Lan, MD;  Location: [facility];  Service: Gynecology;  Laterality: N/A;  . Bladder suspension N/A 11/06/2013    Procedure: TRANSVAGINAL TAPE (TVT) PROCEDURE exact midurethral sling;  Surgeon: Everardo All Amundson de Berton Lan, MD;  Location: [facility];  Service: Gynecology;  Laterality: N/A;  . Cystoscopy N/A 11/06/2013    Procedure: CYSTOSCOPY;  Surgeon: Jamey Reas de Berton Lan, MD;  Location: [facility];   Service: Gynecology;  Laterality: N/A;    Current Outpatient Prescriptions  Medication Sig Dispense Refill  . cholecalciferol (VITAMIN D) 1000 UNITS tablet Take 1,000 Units by mouth daily.      . ciprofloxacin (CIPRO) 250 MG tablet Take 1 tablet (250 mg) by mouth daily while foley catheter is in.  7 tablet  0  . docusate sodium (COLACE) 100 MG capsule Take 100 mg by mouth. Takes 2 tablets at night      . estradiol (VIVELLE-DOT) 0.0375 MG/24HR Place 1 patch onto the skin 2 (two) times a week. Apply anywhere on lower abdomen, applies on Sun and Wed      . ibuprofen (ADVIL,MOTRIN) 800 MG tablet Take 800 mg by mouth 3 (three) times daily.      . Multiple Vitamin (MULTIVITAMIN) tablet Take 1 tablet by mouth daily.      Marland Kitchen PREMARIN vaginal cream Place 1 Applicatorful vaginally 2 (two) times a week.      . Probiotic Product (ALIGN) 4 MG CAPS Take 1 capsule by mouth daily.      . TOVIAZ 4 MG TB24 tablet Take 4 mg by mouth daily.       No current facility-administered medications for this visit.     ALLERGIES: Sulfa antibiotics  Family History  Problem Relation Age of Onset  . Cancer Maternal  Grandfather     History   Social History  . Marital Status: Married    Spouse Name: N/A    Number of Children: 3  . Years of Education: N/A   Occupational History  . Not on file.   Social History Main Topics  . Smoking status: Former Smoker    Types: Cigarettes    Quit date: 04/27/1973  . Smokeless tobacco: Never Used  . Alcohol Use: 8.4 oz/week    14 Glasses of wine per week     Comment: 10-14 glasses of wine a week  . Drug Use: No  . Sexual Activity: Not on file     Comment: vasectomy/TAH   Other Topics Concern  . Not on file   Social History Narrative  . No narrative on file    ROS:  Pertinent items are noted in HPI.  PHYSICAL EXAMINATION:    BP 132/88  Pulse 80  Resp 16  Ht 5\' 3"  (1.6 m)  Wt 163 lb 6.4 oz (74.118 kg)  BMI 28.95 kg/m2     General appearance: alert,  cooperative and appears stated age Abdomen: incision - intact with steristrips on, dried blood noted, no induration. soft, non-tender; no masses,  no organomegaly No abnormal inguinal nodes palpated  Pelvic: External genitalia:  Ecchymoses of the mons pubis.  Suprapubic incisions intact.               Urethra:  normal appearing urethra with no masses, tenderness or lesions                      Bimanual Exam:  No masses.  Suture lines intact.  No palpable sling or vault mesh.  No masses.  Good vaginal length and caliber.   Procedure - foley catheter removed.   ASSESSMENT  Status post abdominal sacrocolpopexy with anterior and posterior colporrhaphy, TVT midurethral sling and cystoscopy.   Urinary retention post op.  PLAN  Will do voiding trial at home.  Will go to [facility] if unable to void in 3 - 4 hours. Continue decreased activity.  Do not restart Toviaz until voiding very well.  Return for 6 week post op visit.    An After Visit Summary was printed and given to the patient.

## 2013-11-12 NOTE — Patient Instructions (Addendum)
It is OK to do some light walking, but not to the point of exercise.   If you cannot void well within 3 - 4 hours, you will need to go to the [facility] to have a bladder catheter placed.

## 2013-11-12 NOTE — MAU Provider Note (Signed)
History     CSN: [id number]  Arrival date and time: 11/12/13 9417   First Provider Initiated Contact with Patient 11/12/13 2008      No chief complaint on file.  HPI  [name] is a 63 y.o. [phone] who presents today with urinary leaking after surgery. She had surgery (PROCEDURE: ABDOMINAL SACROCOLPOPEXY, TVT EXACT MIDURETHRAL SLING, CYSTOSCOPY, ANTERIOR AND POSTERIOR COLPORRHAPHY, ENTEROCELE REPAIR)  on 6/23 and was was sent home on 6/25. She was seen in the office today, and had her catheter removed. She was told that if she had leaking of urine after surgery she was to return here for a catheter. She states that after the removal of the catheter she was able to void 3 times without any difficulty. Then she began to leak/dribble urine, and was only able to void very small amounts. She states that she is drinking a lot of water and cranberry juice.  Past Medical History  Diagnosis Date  . Bladder tumor 1992    BENIGN  . Fibroid   . Urinary urgency     Past Surgical History  Procedure Laterality Date  . Anterior cruciate ligament repair Right   . Bladder tumor excision  1992    benign  . Total abdominal hysterectomy w/ bilateral salpingoophorectomy  06/15/04    dermoid and AUB  . Abdominal hysterectomy    . Abdominal sacrocolpopexy N/A 11/06/2013    Procedure: ABDOMINO SACROCOLPOPEXY with Halban's culdoplasty;  Surgeon: Jamey Reas de Berton Lan, MD;  Location: [facility];  Service: Gynecology;  Laterality: N/A;  . Anterior and posterior repair N/A 11/06/2013    Procedure: ANTERIOR (CYSTOCELE) AND POSTERIOR REPAIR (RECTOCELE);  Surgeon: Jamey Reas de Berton Lan, MD;  Location: [facility];  Service: Gynecology;  Laterality: N/A;  . Bladder suspension N/A 11/06/2013    Procedure: TRANSVAGINAL TAPE (TVT) PROCEDURE exact midurethral sling;  Surgeon: Everardo All Amundson de Berton Lan, MD;  Location: [facility];  Service: Gynecology;  Laterality: N/A;  . Cystoscopy  N/A 11/06/2013    Procedure: CYSTOSCOPY;  Surgeon: Jamey Reas de Berton Lan, MD;  Location: [facility];  Service: Gynecology;  Laterality: N/A;    Family History  Problem Relation Age of Onset  . Cancer Maternal Grandfather     History  Substance Use Topics  . Smoking status: Former Smoker    Types: Cigarettes    Quit date: 04/27/1973  . Smokeless tobacco: Never Used  . Alcohol Use: 8.4 oz/week    14 Glasses of wine per week     Comment: 10-14 glasses of wine a week    Allergies:  Allergies  Allergen Reactions  . Sulfa Antibiotics Hives    Prescriptions prior to admission  Medication Sig Dispense Refill  . cholecalciferol (VITAMIN D) 1000 UNITS tablet Take 1,000 Units by mouth daily.      . ciprofloxacin (CIPRO) 250 MG tablet Take 1 tablet (250 mg) by mouth daily while foley catheter is in.  7 tablet  0  . docusate sodium (COLACE) 100 MG capsule Take 100 mg by mouth. Takes 2 tablets at night      . Multiple Vitamin (MULTIVITAMIN) tablet Take 1 tablet by mouth daily.      Marland Kitchen PREMARIN vaginal cream Place 1 Applicatorful vaginally 2 (two) times a week.      . Probiotic Product (ALIGN) 4 MG CAPS Take 1 capsule by mouth daily.      . TOVIAZ 4 MG TB24 tablet  Take 4 mg by mouth daily.      Marland Kitchen estradiol (VIVELLE-DOT) 0.0375 MG/24HR Place 1 patch onto the skin 2 (two) times a week. Apply anywhere on lower abdomen, applies on Sun and Wed        ROS Physical Exam   There were no vitals taken for this visit.  Physical Exam  Nursing note and vitals reviewed. Constitutional: She is oriented to person, place, and time. She appears well-developed and well-nourished. No distress.  Cardiovascular: Normal rate.   Respiratory: Effort normal.  GI: Soft. There is no tenderness.  Genitourinary:  Incision: C/D/I Bruising over the pubic bone Bladder scanner >300cc residual after voiding  Neurological: She is alert and oriented to person, place, and time.  Skin: Skin is warm and dry.   Psychiatric: She has a normal mood and affect.   2125: 400 cc of clear yellow urine obtained with I&O cath about 30 mins after patient had voided  2138: D/W Dr. Quincy Simmonds, will place foley, and have patient call to be seen on Wednesday.   MAU Course  Procedures     Assessment and Plan   1. Urinary retention with incomplete bladder emptying    Foley cath placed here in MAU today FU with the office on Wednesday for removal, and teaching of I&O cath Return to MAU as needed  Follow-up Information   Follow up with Amundson de Berton Lan, MD. (Call for an appointment for [facility] )    Specialty:  Obstetrics and Gynecology   Contact information:   [address] [place] [address] [phone]        [name] 11/12/2013, 8:16 PM   Addendum from Dr. Quincy Simmonds  Patient was seen in the office at approximately 3:00 pm on 11/12/13  for catheter removal and was instructed to [facility] if unable to void well within 3 - 4 hours. Will be taught self cath at follow up office visit this week.  Brook A. Quincy Simmonds, MD

## 2013-11-13 ENCOUNTER — Telehealth: Payer: Self-pay | Admitting: Obstetrics and Gynecology

## 2013-11-13 NOTE — Telephone Encounter (Signed)
Please schedule the patient for an appointment with me tomorrow so I can remove her foley catheter and teach her self cath.  Thanks!

## 2013-11-13 NOTE — Telephone Encounter (Signed)
Patient had surgery last week. She came in yesterday and had her catheter removed. She is calling to speak with the nurse as she ended up having to go to the ED to have a catheter reinserted. Please advise.

## 2013-11-13 NOTE — Telephone Encounter (Signed)
Spoke with at time of call. She states she had her foley removed yesterday by Dr. Quincy Simmonds and went home and was able to void two times. She was pushing fluids and then developed pelvic pressure and was only voiding small amounts when urinating.  She went to [facility] and they placed another foley. Patient's instructions state to call for an appointment with Dr. Quincy Simmonds for Wednesday and she was calling to what Dr. Quincy Simmonds would like her to do. She denies complaints at this time, no fevers or other concerns, but does voice desire to have foley removed.  Advised I would send a message to Dr. Quincy Simmonds for disposition. Patient is agreeable and will await return call from our office.

## 2013-11-13 NOTE — Discharge Summary (Signed)
Physician Discharge Summary  Patient ID: [name] MRN: [id number] DOB/AGE: 11/19/1950 63 y.o.  Admit date: 11/06/2013 Discharge date: 11/08/13  Admission Diagnoses: 1.  Incomplete vaginal prolapse. 2.  Genuine stress incontinence.  Discharge Diagnoses:  1.  Incomplete vaginal prolapse. 2.  Genuine stress incontinence. 3.  Extensive pelvic adhesions.  4.  Status post abdominosacrocolpopexy, extensive lysis of adhesions, TVT Exact midurethral sling, cystoscopy, anterior and posterior colporrhaphy, enterocele repair.   Active Problems:   Post-operative state   Discharged Condition: good  Hospital Course:  The patient was admitted on 11/08/13 for an abdominosacrocolpopexy, extensive lysis of adhesions, TVT Exact midurethral sling, cystoscopy, anterior and posterior colporrhaphy, enterocele repair which were performed without complication while under general anesthesia.  The patient's post op course was uneventful.  She had a morphine PCA and Toradol for pain control initially, and this was converted over to Percocet and Motrin on post op day one when the patient began taking po well.  She ambulated independently and wore PAS and Ted hose for DVT prophylaxis while in bed.  Her vaginal packing and foley catheter were removed on post op day one, and she voided frequently but had post void residuals over 100 cc so her foley catheter was replaced.  She had a second voiding trial on post op day two, but again had a residual volume well over 100 cc and therefore was sent home with a foley catheter at the time of discharge.  The patient's vital signs remained stable and she demonstrated no signs of infection during her hospitalization.  The patient's post op day one Hgb was 11.9, and she was tolerating this well. She was found to be in good condition and ready for discharge on post op day one.  She will be discharged with a prescription for Ciprofloxacin for UTI proplylaxis.    Consults:  None  Significant Diagnostic Studies: labs: Hgb 11.9 on post op day number one.   Treatments: surgery:   Status post abdominosacrocolpopexy, extensive lysis of adhesions, TVT Exact midurethral sling, cystoscopy, anterior and posterior colporrhaphy, enterocele repair.   Discharge Exam: Blood pressure 135/68, pulse 71, temperature 97.7 F (36.5 C), temperature source Oral, resp. rate 18, height 5\' 3"  (1.6 m), weight 165 lb (74.844 kg), SpO2 100.00%.  General: alert  Resp: clear to auscultation bilaterally  Cardio: regular rate and rhythm, S1, S2 normal, no murmur, click, rub or gallop  GI: soft, non-tender; bowel sounds normal; no masses, no organomegaly and incision: Dried blood on steristrips. No active bleeding. Suprapubic ecchymoses with no induration. Intact incisions.  Vaginal Bleeding: minimal  Disposition: 01-Home or Self Care     Medication List    STOP taking these medications       conjugated estrogens vaginal cream  Commonly known as:  PREMARIN     fesoterodine 4 MG Tb24 tablet  Commonly known as:  TOVIAZ      TAKE these medications       ALIGN 4 MG Caps  Take 1 capsule by mouth daily.     cholecalciferol 1000 UNITS tablet  Commonly known as:  VITAMIN D  Take 1,000 Units by mouth daily.     ciprofloxacin 250 MG tablet  Commonly known as:  CIPRO  Take 1 tablet (250 mg) by mouth daily while foley catheter is in.     docusate sodium 100 MG capsule  Commonly known as:  COLACE  Take 100 mg by mouth. Takes 2 tablets at night     estradiol 0.0375  MG/24HR  Commonly known as:  VIVELLE-DOT  Place 1 patch onto the skin 2 (two) times a week. Apply anywhere on lower abdomen, applies on Sun and Wed     multivitamin tablet  Take 1 tablet by mouth daily.           Follow-up Information   Follow up with Amundson de Berton Lan, MD In 4 days.   Specialty:  Obstetrics and Gynecology   Contact information:   [address] [place]  [address] [phone]      Postoperative care instructions and precautions reviewed with the patient and given in written form.   Signed: Tacy Learn 11/13/2013, 10:06 PM

## 2013-11-13 NOTE — Telephone Encounter (Signed)
Spoke with patient and she is given appointment tomorrow with Dr. Quincy Simmonds for follow up at 1:00. She is agreealbe to this and will call back with any concerns.   Routing to [name] for final review. Patient agreeable to disposition. Will close encounter

## 2013-11-14 ENCOUNTER — Ambulatory Visit (INDEPENDENT_AMBULATORY_CARE_PROVIDER_SITE_OTHER): Payer: 59 | Admitting: Obstetrics and Gynecology

## 2013-11-14 ENCOUNTER — Encounter: Payer: Self-pay | Admitting: Obstetrics and Gynecology

## 2013-11-14 VITALS — BP 130/80 | HR 80 | Ht 63.0 in | Wt 162.8 lb

## 2013-11-14 DIAGNOSIS — R339 Retention of urine, unspecified: Secondary | ICD-10-CM

## 2013-11-14 NOTE — Progress Notes (Signed)
Patient ID: [name], female   DOB: June 26, 1950, 63 y.o.   MRN: [id number] GYNECOLOGY  VISIT   HPI: 63 y.o.   Married  Caucasian  female   [phone] with No LMP recorded. Patient has had a hysterectomy.   here for  Follow up.  Had office visit on 11/12/13 for catheter removal and initially was voiding at home.  Later that evening went to the [facility] for difficulty with voiding. Had catheterization for a residual volume of 400 cc.  Foley was replaced.   GYNECOLOGIC HISTORY: No LMP recorded. Patient has had a hysterectomy. Contraception:  Hysterectomy  Menopausal hormone therapy: vivelle Dot and Premarin vaginal cream(hasn't restarted cream)        OB History   Grav Para Term Preterm Abortions TAB SAB Ect Mult Living   3 3 3       3          Patient Active Problem List   Diagnosis Date Noted  . Post-operative state 11/06/2013    Past Medical History  Diagnosis Date  . Bladder tumor 1992    BENIGN  . Fibroid   . Urinary urgency     Past Surgical History  Procedure Laterality Date  . Anterior cruciate ligament repair Right   . Bladder tumor excision  1992    benign  . Total abdominal hysterectomy w/ bilateral salpingoophorectomy  06/15/04    dermoid and AUB  . Abdominal hysterectomy    . Abdominal sacrocolpopexy N/A 11/06/2013    Procedure: ABDOMINO SACROCOLPOPEXY with Halban's culdoplasty;  Surgeon: Jamey Reas de Berton Lan, MD;  Location: [facility];  Service: Gynecology;  Laterality: N/A;  . Anterior and posterior repair N/A 11/06/2013    Procedure: ANTERIOR (CYSTOCELE) AND POSTERIOR REPAIR (RECTOCELE);  Surgeon: Jamey Reas de Berton Lan, MD;  Location: [facility];  Service: Gynecology;  Laterality: N/A;  . Bladder suspension N/A 11/06/2013    Procedure: TRANSVAGINAL TAPE (TVT) PROCEDURE exact midurethral sling;  Surgeon: Everardo All Amundson de Berton Lan, MD;  Location: [facility];  Service: Gynecology;  Laterality: N/A;  . Cystoscopy N/A  11/06/2013    Procedure: CYSTOSCOPY;  Surgeon: Jamey Reas de Berton Lan, MD;  Location: [facility];  Service: Gynecology;  Laterality: N/A;    Current Outpatient Prescriptions  Medication Sig Dispense Refill  . cholecalciferol (VITAMIN D) 1000 UNITS tablet Take 1,000 Units by mouth daily.      Marland Kitchen docusate sodium (COLACE) 100 MG capsule Take 100 mg by mouth. Takes 2 tablets at night      . estradiol (VIVELLE-DOT) 0.0375 MG/24HR Place 1 patch onto the skin 2 (two) times a week. Apply anywhere on lower abdomen, applies on Sun and Wed      . Multiple Vitamin (MULTIVITAMIN) tablet Take 1 tablet by mouth daily.      . Probiotic Product (ALIGN) 4 MG CAPS Take 1 capsule by mouth daily.      Marland Kitchen PREMARIN vaginal cream Place 1 Applicatorful vaginally 2 (two) times a week.      . TOVIAZ 4 MG TB24 tablet Take 4 mg by mouth daily.       No current facility-administered medications for this visit.     ALLERGIES: Sulfa antibiotics  Family History  Problem Relation Age of Onset  . Cancer Maternal Grandfather     History   Social History  . Marital Status: Married    Spouse Name: N/A    Number of Children: 3  .  Years of Education: N/A   Occupational History  . Not on file.   Social History Main Topics  . Smoking status: Former Smoker    Types: Cigarettes    Quit date: 04/27/1973  . Smokeless tobacco: Never Used  . Alcohol Use: 8.4 oz/week    14 Glasses of wine per week     Comment: 10-14 glasses of wine a week  . Drug Use: No  . Sexual Activity: Yes    Partners: Male    Birth Control/ Protection: Surgical     Comment: vasectomy/TAH   Other Topics Concern  . Not on file   Social History Narrative  . No narrative on file    ROS:  Pertinent items are noted in HPI.  PHYSICAL EXAMINATION:    BP 130/80  Pulse 80  Ht 5\' 3"  (1.6 m)  Wt 162 lb 12.8 oz (73.846 kg)  BMI 28.85 kg/m2     General appearance: alert, cooperative and appears stated age   Abdomen: soft,  non-tender; no masses,  no organomegaly.                   Incisions intact.   Pelvic: External genitalia:  no lesions              Urethra:  normal appearing urethra with no masses, tenderness or lesions              Bartholins and Skenes: normal                          Bimanual Exam:  Uterus:   Absent                                      Adnexa: no masses                                     Suture lines present anteriorly and posteriorly.                                      Midurethral sling protected and not exposed.                                       Self catheterization successfully taught.  Patient able to do this with mirror and visual assistance.   ASSESSMENT  Urinary retention.   PLAN  Successful intermittent clean self cath taught.  Patient will return to the office in one week.  Call for painful urination, blood in urine, or any other concern.  Given cath tips.      An After Visit Summary was printed and given to the patient.  __15____ minutes face to face time of which over 50% was spent in counseling.

## 2013-11-15 ENCOUNTER — Encounter: Payer: Self-pay | Admitting: Obstetrics and Gynecology

## 2013-11-15 DIAGNOSIS — R339 Retention of urine, unspecified: Secondary | ICD-10-CM | POA: Insufficient documentation

## 2013-11-16 LAB — CULTURE, URINE COMPREHENSIVE
Colony Count: NO GROWTH
ORGANISM ID, BACTERIA: NO GROWTH

## 2013-11-22 ENCOUNTER — Encounter: Payer: Self-pay | Admitting: Obstetrics and Gynecology

## 2013-11-22 ENCOUNTER — Ambulatory Visit (INDEPENDENT_AMBULATORY_CARE_PROVIDER_SITE_OTHER): Payer: 59 | Admitting: Obstetrics and Gynecology

## 2013-11-22 VITALS — BP 122/70 | HR 64 | Resp 18 | Ht 63.0 in | Wt 162.0 lb

## 2013-11-22 DIAGNOSIS — Z9889 Other specified postprocedural states: Secondary | ICD-10-CM

## 2013-11-22 NOTE — Progress Notes (Signed)
GYNECOLOGY  VISIT   HPI: 63 y.o.   Married  Caucasian  female   [phone] with No LMP recorded. Patient has had a hysterectomy.   here for   Follow up. Patient is status post Abdominal sacrocolpopexy, tension-free vaginal tape exact  mid urethral sling, cystoscopy, anterior and posterior colporrhaphy,  enterocele repair, lysis of adhesions on 11/06/13.  Taught self cath at last office visit as was voiding but still having urinary retention.   No leaking with coughing. Did self cath twice and hardly anything came out.  Still bloated.  Having normal bowel movements.    GYNECOLOGIC HISTORY: No LMP recorded. Patient has had a hysterectomy. Contraception:  Hysterectomy Menopausal hormone therapy: Estradiol Patch         OB History   Grav Para Term Preterm Abortions TAB SAB Ect Mult Living   3 3 3       3          Patient Active Problem List   Diagnosis Date Noted  . Urinary retention 11/15/2013  . Post-operative state 11/06/2013    Past Medical History  Diagnosis Date  . Bladder tumor 1992    BENIGN  . Fibroid   . Urinary urgency     Past Surgical History  Procedure Laterality Date  . Anterior cruciate ligament repair Right   . Bladder tumor excision  1992    benign  . Total abdominal hysterectomy w/ bilateral salpingoophorectomy  06/15/04    dermoid and AUB  . Abdominal hysterectomy    . Abdominal sacrocolpopexy N/A 11/06/2013    Procedure: ABDOMINO SACROCOLPOPEXY with Halban's culdoplasty;  Surgeon: Jamey Reas de Berton Lan, MD;  Location: [facility];  Service: Gynecology;  Laterality: N/A;  . Anterior and posterior repair N/A 11/06/2013    Procedure: ANTERIOR (CYSTOCELE) AND POSTERIOR REPAIR (RECTOCELE);  Surgeon: Jamey Reas de Berton Lan, MD;  Location: [facility];  Service: Gynecology;  Laterality: N/A;  . Bladder suspension N/A 11/06/2013    Procedure: TRANSVAGINAL TAPE (TVT) PROCEDURE exact midurethral sling;  Surgeon: Everardo All Amundson de Berton Lan, MD;  Location: [facility];  Service: Gynecology;  Laterality: N/A;  . Cystoscopy N/A 11/06/2013    Procedure: CYSTOSCOPY;  Surgeon: Jamey Reas de Berton Lan, MD;  Location: [facility];  Service: Gynecology;  Laterality: N/A;    Current Outpatient Prescriptions  Medication Sig Dispense Refill  . cholecalciferol (VITAMIN D) 1000 UNITS tablet Take 1,000 Units by mouth daily.      Marland Kitchen docusate sodium (COLACE) 100 MG capsule Take 100 mg by mouth. Takes 2 tablets at night      . estradiol (VIVELLE-DOT) 0.0375 MG/24HR Place 1 patch onto the skin 2 (two) times a week. Apply anywhere on lower abdomen, applies on Sun and Wed      . Multiple Vitamin (MULTIVITAMIN) tablet Take 1 tablet by mouth daily.      . Probiotic Product (ALIGN) 4 MG CAPS Take 1 capsule by mouth daily.      Marland Kitchen PREMARIN vaginal cream Place 1 Applicatorful vaginally 2 (two) times a week.      . TOVIAZ 4 MG TB24 tablet Take 4 mg by mouth daily.       No current facility-administered medications for this visit.     ALLERGIES: Sulfa antibiotics  Family History  Problem Relation Age of Onset  . Cancer Maternal Grandfather     History   Social History  . Marital Status: Married  Spouse Name: N/A    Number of Children: 3  . Years of Education: N/A   Occupational History  . Not on file.   Social History Main Topics  . Smoking status: Former Smoker    Types: Cigarettes    Quit date: 04/27/1973  . Smokeless tobacco: Never Used  . Alcohol Use: 8.4 oz/week    14 Glasses of wine per week     Comment: 10-14 glasses of wine a week  . Drug Use: No  . Sexual Activity: Yes    Partners: Male    Birth Control/ Protection: Surgical     Comment: vasectomy/TAH   Other Topics Concern  . Not on file   Social History Narrative  . No narrative on file    ROS:  Pertinent items are noted in HPI.  PHYSICAL EXAMINATION:    BP 122/70  Pulse 64  Resp 18  Ht 5\' 3"  (1.6 m)  Wt 162 lb (73.483 kg)  BMI 28.70 kg/m2      General appearance: alert, cooperative and appears stated age Abdomen: Pfannenstiel, soft, non-tender; no masses,  no organomegaly No abnormal inguinal nodes palpated  Pelvic: External genitalia:  no lesions              Urethra:  normal appearing urethra with no masses, tenderness or lesions              Bartholins and Skenes: normal                      Bimanual Exam:  Uterus:  absent                                      Adnexa:  no masses                                      Vaginal suture lines intact.                                       Midurethral sling protected and not exposed.                            Good vaginal vault suspension.   ASSESSMENT  Urinary retention resolved.  Doing well.   PLAN  Return in 4 weeks for next post op check.  Return prn any problems.    An After Visit Summary was printed and given to the patient.  _15_____ minutes face to face time of which over 50% was spent in counseling.

## 2013-12-19 ENCOUNTER — Ambulatory Visit (INDEPENDENT_AMBULATORY_CARE_PROVIDER_SITE_OTHER): Payer: 59 | Admitting: Obstetrics and Gynecology

## 2013-12-19 ENCOUNTER — Encounter: Payer: Self-pay | Admitting: Obstetrics and Gynecology

## 2013-12-19 VITALS — BP 130/80 | HR 66 | Resp 14 | Ht 63.0 in | Wt 166.2 lb

## 2013-12-19 DIAGNOSIS — Z9889 Other specified postprocedural states: Secondary | ICD-10-CM

## 2013-12-19 NOTE — Patient Instructions (Signed)
Continued decreased activity as we discussed.  No sexual activity yet.

## 2013-12-19 NOTE — Progress Notes (Signed)
Patient ID: [name], female   DOB: March 22, 1951, 63 y.o.   MRN: [id number] GYNECOLOGY  VISIT   HPI: 63 y.o.   Married  Caucasian  female   [phone] with No LMP recorded. Patient has had a hysterectomy.   here for  6 weeks post op. Status post abdominal sacrocolpopexy with TAH/BSO, A and P repair, TVT sling and cystoscopy. Feeling really good and wants to start exercising. First am void is a little slower to start.  No leaking urine if sneeze or cough.  Happy with this! Bowel function is good.  Notes general moisture per vagina.  Has area on mid thighs that are slightly red but not itching.   GYNECOLOGIC HISTORY: No LMP recorded. Patient has had a hysterectomy. Contraception: Hysterectomy   Menopausal hormone therapy: none        OB History   Grav Para Term Preterm Abortions TAB SAB Ect Mult Living   3 3 3       3          Patient Active Problem List   Diagnosis Date Noted  . Post-operative state 11/06/2013    Past Medical History  Diagnosis Date  . Bladder tumor 1992    BENIGN  . Fibroid   . Urinary urgency     Past Surgical History  Procedure Laterality Date  . Anterior cruciate ligament repair Right   . Bladder tumor excision  1992    benign  . Total abdominal hysterectomy w/ bilateral salpingoophorectomy  06/15/04    dermoid and AUB  . Abdominal hysterectomy    . Abdominal sacrocolpopexy N/A 11/06/2013    Procedure: ABDOMINO SACROCOLPOPEXY with Halban's culdoplasty;  Surgeon: Jamey Reas de Berton Lan, MD;  Location: [facility];  Service: Gynecology;  Laterality: N/A;  . Anterior and posterior repair N/A 11/06/2013    Procedure: ANTERIOR (CYSTOCELE) AND POSTERIOR REPAIR (RECTOCELE);  Surgeon: Jamey Reas de Berton Lan, MD;  Location: [facility];  Service: Gynecology;  Laterality: N/A;  . Bladder suspension N/A 11/06/2013    Procedure: TRANSVAGINAL TAPE (TVT) PROCEDURE exact midurethral sling;  Surgeon: Everardo All Amundson de Berton Lan, MD;   Location: [facility];  Service: Gynecology;  Laterality: N/A;  . Cystoscopy N/A 11/06/2013    Procedure: CYSTOSCOPY;  Surgeon: Jamey Reas de Berton Lan, MD;  Location: [facility];  Service: Gynecology;  Laterality: N/A;    Current Outpatient Prescriptions  Medication Sig Dispense Refill  . cholecalciferol (VITAMIN D) 1000 UNITS tablet Take 1,000 Units by mouth daily.      Marland Kitchen docusate sodium (COLACE) 100 MG capsule Take 100 mg by mouth. Takes 2 tablets at night      . estradiol (VIVELLE-DOT) 0.0375 MG/24HR Place 1 patch onto the skin 2 (two) times a week. Apply anywhere on lower abdomen, applies on Sun and Wed      . Multiple Vitamin (MULTIVITAMIN) tablet Take 1 tablet by mouth daily.      . Probiotic Product (ALIGN) 4 MG CAPS Take 1 capsule by mouth daily.      Marland Kitchen PREMARIN vaginal cream Place 1 Applicatorful vaginally 2 (two) times a week.      . TOVIAZ 4 MG TB24 tablet Take 4 mg by mouth daily.       No current facility-administered medications for this visit.     ALLERGIES: Sulfa antibiotics  Family History  Problem Relation Age of Onset  . Cancer Maternal Grandfather     History  Social History  . Marital Status: Married    Spouse Name: N/A    Number of Children: 3  . Years of Education: N/A   Occupational History  . Not on file.   Social History Main Topics  . Smoking status: Former Smoker    Types: Cigarettes    Quit date: 04/27/1973  . Smokeless tobacco: Never Used  . Alcohol Use: 8.4 oz/week    14 Glasses of wine per week     Comment: 10-14 glasses of wine a week  . Drug Use: No  . Sexual Activity: Yes    Partners: Male    Birth Control/ Protection: Surgical     Comment: vasectomy/TAH   Other Topics Concern  . Not on file   Social History Narrative  . No narrative on file    ROS:  Pertinent items are noted in HPI.  PHYSICAL EXAMINATION:    BP 130/80  Pulse 66  Resp 14  Ht 5\' 3"  (1.6 m)  Wt 166 lb 3.2 oz (75.388 kg)  BMI 29.45 kg/m2      General appearance: alert, cooperative and appears stated age   Abdomen: Pfannenstiel incision well healed, soft, non-tender; no masses,  no organomegaly No abnormal inguinal nodes palpated  Pelvic: External genitalia:  no lesions              Urethra:  normal appearing urethra with no masses, tenderness or lesions              Bartholins and Skenes: normal                 Vagina: normal appearing vagina with normal color and discharge, no lesions.  Sling protected.  No mesh noted.  Sutures present.  Good length and caliber.               Cervix:  absent                   Bimanual Exam:  Uterus: absent.                                       Adnexa: normal adnexa in size, nontender and no masses                                      Rectovaginal: Confirms                                      Anus:  normal sphincter tone, no lesions  ASSESSMENT  Doing well post op.  Suture still present.   PLAN  Continue decreased activity and pelvic rest for 6 more weeks.  Return for final 3 month post op visit and prn.    An After Visit Summary was printed and given to the patient.  _15_____ minutes face to face time of which over 50% was spent in counseling.   Marland Kitchen

## 2013-12-20 ENCOUNTER — Encounter: Payer: Self-pay | Admitting: Obstetrics and Gynecology

## 2014-01-07 ENCOUNTER — Encounter: Payer: Self-pay | Admitting: Obstetrics and Gynecology

## 2014-01-07 ENCOUNTER — Telehealth: Payer: Self-pay | Admitting: Obstetrics and Gynecology

## 2014-01-07 ENCOUNTER — Ambulatory Visit (INDEPENDENT_AMBULATORY_CARE_PROVIDER_SITE_OTHER): Payer: 59 | Admitting: Obstetrics and Gynecology

## 2014-01-07 VITALS — BP 130/84 | HR 68 | Temp 99.1°F | Ht 63.0 in | Wt 166.6 lb

## 2014-01-07 DIAGNOSIS — M25559 Pain in unspecified hip: Secondary | ICD-10-CM

## 2014-01-07 DIAGNOSIS — R319 Hematuria, unspecified: Secondary | ICD-10-CM

## 2014-01-07 LAB — CBC WITH DIFFERENTIAL/PLATELET
BASOS ABS: 0 10*3/uL (ref 0.0–0.1)
BASOS PCT: 0 % (ref 0–1)
Eosinophils Absolute: 0.3 10*3/uL (ref 0.0–0.7)
Eosinophils Relative: 4 % (ref 0–5)
HCT: 41.4 % (ref 36.0–46.0)
HEMOGLOBIN: 14 g/dL (ref 12.0–15.0)
LYMPHS PCT: 18 % (ref 12–46)
Lymphs Abs: 1.6 10*3/uL (ref 0.7–4.0)
MCH: 32 pg (ref 26.0–34.0)
MCHC: 33.8 g/dL (ref 30.0–36.0)
MCV: 94.7 fL (ref 78.0–100.0)
MONO ABS: 0.7 10*3/uL (ref 0.1–1.0)
MONOS PCT: 8 % (ref 3–12)
Neutro Abs: 6.1 10*3/uL (ref 1.7–7.7)
Neutrophils Relative %: 70 % (ref 43–77)
Platelets: 433 10*3/uL — ABNORMAL HIGH (ref 150–400)
RBC: 4.37 MIL/uL (ref 3.87–5.11)
RDW: 13.3 % (ref 11.5–15.5)
WBC: 8.7 10*3/uL (ref 4.0–10.5)

## 2014-01-07 LAB — COMPREHENSIVE METABOLIC PANEL
ALT: 20 U/L (ref 0–35)
AST: 16 U/L (ref 0–37)
Albumin: 4.6 g/dL (ref 3.5–5.2)
Alkaline Phosphatase: 107 U/L (ref 39–117)
BUN: 9 mg/dL (ref 6–23)
CALCIUM: 9.4 mg/dL (ref 8.4–10.5)
CHLORIDE: 102 meq/L (ref 96–112)
CO2: 28 mEq/L (ref 19–32)
CREATININE: 0.62 mg/dL (ref 0.50–1.10)
GLUCOSE: 88 mg/dL (ref 70–99)
Potassium: 4.7 mEq/L (ref 3.5–5.3)
Sodium: 138 mEq/L (ref 135–145)
Total Bilirubin: 0.5 mg/dL (ref 0.2–1.2)
Total Protein: 7.3 g/dL (ref 6.0–8.3)

## 2014-01-07 MED ORDER — CIPROFLOXACIN HCL 500 MG PO TABS: 500.0000 mg | ORAL_TABLET | Freq: Two times a day (BID) | ORAL | Status: DC

## 2014-01-07 NOTE — Telephone Encounter (Signed)
Patient is calling with complaints of abdominal pain that started yesterday. Patient with hx of diverticulitis. Patient concerned that this pain is r/t to the surgery that she had with Dr. Quincy Simmonds on 11/06/13. Patient reports that she does have diverticulitis as well, but feels that this abdominal pain she is having and radiates to her back are not the same symptoms that she usually has with a flare of diverticulitis. No fevers and no pain at this time. But states that when pain occurs, "it is horrible." Patient tolerated toast and apple sauce this morning but had one episode of diarrhea afterwards. Offered patient office visit with Dr. Quincy Simmonds as patient feels that these symptoms are r/t prior surgery and not diverticulitis. Appointment scheduled with Dr. Quincy Simmonds for today at 1030.  Routing to provider for final review. Patient agreeable to disposition. Will close encounter

## 2014-01-07 NOTE — Telephone Encounter (Signed)
Patient calling re: "Pain in stomach and now in back" she thinks may be related to her surgery 11/06/13. Please advise.

## 2014-01-07 NOTE — Telephone Encounter (Signed)
Pt saw Dr.Silva today and was told to return on 01/10/14 around mid morning for follow up and possible ultrasound.

## 2014-01-07 NOTE — Progress Notes (Signed)
Patient ID: [name], female   DOB: 10/02/1950, 63 y.o.   MRN: [id number] GYNECOLOGY VISIT  PCP:  Donnie Coffin, MD  Referring provider:   HPI: 63 y.o.   Married  Caucasian  female   [phone] with No LMP recorded. Patient has had a hysterectomy.   here for  Mid lower pelvic pain/pressure x2 days.   Pain is radiating to bilateral lower back.  No fever, shakes or chills at home.  Little diarrhea this am.  No blood in the stool. Felt quaesy last night an this am.  Ate normal meal at lunch yesterday.  Hungry.  Took ibuprofen for pain, last at 20:30 last hs and nothing since.  No dysuria.  No hematuria.   Abdominal sacrocolpopexy, tension-free vaginal tape exact  mid urethral sling, cystoscopy, anterior and posterior colporrhaphy,  enterocele repair, lysis of adhesions 11/06/13.  Not taking Toviaz since prior to surgery.  Not using the vaginal estrogen cream.   History of diverticulitis.     Hgb:   Urine:  Trace WBC's, Trace RBC's  GYNECOLOGIC HISTORY: No LMP recorded. Patient has had a hysterectomy. Sexually active:  yes   Partner preference: female Contraception:  hysterectomy  Menopausal hormone therapy: Minivelle DES exposure:  no Blood transfusions:   no Sexually transmitted diseases:  no  GYN procedures and prior surgeries:  TAH/BSO in 2006, Abdomino Sacrocolpopexy, A & P repair/TVT Last mammogram: 01/2013 wnl                Last pap and high risk HPV testing:   08-02-05 wnl History of abnormal pap smear:  One in remote past   OB History   Grav Para Term Preterm Abortions TAB SAB Ect Mult Living   3 3 3       3        Patient Active Problem List   Diagnosis Date Noted  . Post-operative state 11/06/2013    Past Medical History  Diagnosis Date  . Bladder tumor 1992    BENIGN  . Fibroid   . Urinary urgency     Past Surgical History  Procedure Laterality Date  . Anterior cruciate ligament repair Right   . Bladder tumor excision  1992    benign  .  Total abdominal hysterectomy w/ bilateral salpingoophorectomy  06/15/04    dermoid and AUB  . Abdominal hysterectomy    . Abdominal sacrocolpopexy N/A 11/06/2013    Procedure: ABDOMINO SACROCOLPOPEXY with Halban's culdoplasty;  Surgeon: Jamey Reas de Berton Lan, MD;  Location: [facility];  Service: Gynecology;  Laterality: N/A;  . Anterior and posterior repair N/A 11/06/2013    Procedure: ANTERIOR (CYSTOCELE) AND POSTERIOR REPAIR (RECTOCELE);  Surgeon: Jamey Reas de Berton Lan, MD;  Location: [facility];  Service: Gynecology;  Laterality: N/A;  . Bladder suspension N/A 11/06/2013    Procedure: TRANSVAGINAL TAPE (TVT) PROCEDURE exact midurethral sling;  Surgeon: Everardo All Amundson de Berton Lan, MD;  Location: [facility];  Service: Gynecology;  Laterality: N/A;  . Cystoscopy N/A 11/06/2013    Procedure: CYSTOSCOPY;  Surgeon: Jamey Reas de Berton Lan, MD;  Location: [facility];  Service: Gynecology;  Laterality: N/A;    Current Outpatient Prescriptions  Medication Sig Dispense Refill  . cholecalciferol (VITAMIN D) 1000 UNITS tablet Take 1,000 Units by mouth daily.      Marland Kitchen docusate sodium (COLACE) 100 MG capsule Take 100 mg by mouth. Takes 2 tablets at night      .  estradiol (VIVELLE-DOT) 0.0375 MG/24HR Place 1 patch onto the skin 2 (two) times a week. Apply anywhere on lower abdomen, applies on Sun and Wed      . Multiple Vitamin (MULTIVITAMIN) tablet Take 1 tablet by mouth daily.      . polyethylene glycol powder (GLYCOLAX/MIRALAX) powder Take 0.5 Containers by mouth as needed.      . Probiotic Product (ALIGN) 4 MG CAPS Take 1 capsule by mouth daily.      Marland Kitchen PREMARIN vaginal cream Place 1 Applicatorful vaginally 2 (two) times a week.      . TOVIAZ 4 MG TB24 tablet Take 4 mg by mouth daily.       No current facility-administered medications for this visit.     ALLERGIES: Sulfa antibiotics  Family History  Problem Relation Age of Onset  . Cancer Maternal Grandfather      History   Social History  . Marital Status: Married    Spouse Name: N/A    Number of Children: 3  . Years of Education: N/A   Occupational History  . Not on file.   Social History Main Topics  . Smoking status: Former Smoker    Types: Cigarettes    Quit date: 04/27/1973  . Smokeless tobacco: Never Used  . Alcohol Use: 8.4 oz/week    14 Glasses of wine per week     Comment: 10-14 glasses of wine a week  . Drug Use: No  . Sexual Activity: Yes    Partners: Male    Birth Control/ Protection: Surgical     Comment: vasectomy/TAH   Other Topics Concern  . Not on file   Social History Narrative  . No narrative on file    ROS:  Pertinent items are noted in HPI.  PHYSICAL EXAMINATION:    BP 130/84  Pulse 68  Temp(Src) 99.1 F (37.3 C) (Oral)  Ht 5\' 3"  (1.6 m)  Wt 166 lb 9.6 oz (75.569 kg)  BMI 29.52 kg/m2   Wt Readings from Last 3 Encounters:  01/07/14 166 lb 9.6 oz (75.569 kg)  12/19/13 166 lb 3.2 oz (75.388 kg)  11/22/13 162 lb (73.483 kg)     Ht Readings from Last 3 Encounters:  01/07/14 5\' 3"  (1.6 m)  12/19/13 5\' 3"  (1.6 m)  11/22/13 5\' 3"  (1.6 m)    General appearance: alert, cooperative and appears stated age Head: Normocephalic, without obvious abnormality, atraumatic Lungs: clear to auscultation bilaterally Heart: regular rate and rhythm Abdomen: Incision well healed, soft, mildly tender suprapubically; no guarding, no rebound, no masses,  no organomegaly Extremities: extremities normal, atraumatic, no cyanosis or edema Skin: Skin color, texture, turgor normal. No rashes or lesions Lymph nodes: Cervical, supraclavicular, and axillary nodes normal. No abnormal inguinal nodes palpated Neurologic: Grossly normal  Pelvic: External genitalia:  no lesions              Urethra:  normal appearing urethra with no masses, tenderness or lesions              Bartholins and Skenes: normal                 Vagina: normal appearing vagina with normal color and  discharge, no lesions.  No erosion or exposure of mesh.  Bowel palpable at top of vagina, slightly tender.              Cervix: normal appearance  Bimanual Exam:  Uterus:  uterus is normal size, shape, consistency and nontender                                      Adnexa: normal adnexa in size, nontender and no masses                                      Rectovaginal: Confirms                                      Anus:  normal sphincter tone, no lesions  ASSESSMENT  Pelvic pain.  Possible UTI. Low grade temp.  History of diverticulitis.   PLAN  CBC with diff, CMP.  Urine culture.  Ciprofloxacin 500 mg po bid for 7 days.  Follow up in 3 days, sooner as needed.  If pain persists or worsens, will need imaging study.  No tylenol or NSAIDS at this time.  Do not want to mask a fever.   An After Visit Summary was printed and given to the patient.  15 minutes face to face time of which over 50% was spent in counseling.

## 2014-01-07 NOTE — Telephone Encounter (Signed)
Spoke with patient. Patient states that she needs to get scheduled to see Dr.Silva on Thursday for follow up and possible ultrasound. Appointment scheduled for 8/27 at 10:30am. Patient agreeable to date and time.  Routing to provider for final review. Patient agreeable to disposition. Will close encounter

## 2014-01-08 ENCOUNTER — Other Ambulatory Visit: Payer: Self-pay | Admitting: Nurse Practitioner

## 2014-01-09 ENCOUNTER — Telehealth: Payer: Self-pay

## 2014-01-09 LAB — URINE CULTURE

## 2014-01-09 NOTE — Telephone Encounter (Signed)
Spoke with patient. Advised of message and results as seen below from [place]. Patient is agreeable and verbalizes understanding. Patient states that she feels "better than before" and is only having pain "every once in a while. I think the antibiotics helped." Advised would let Dr.Silva know and that we will see her tomorrow for follow up. Patient agreeable.  Routing to provider for final review. Patient agreeable to disposition. Will close encounter

## 2014-01-09 NOTE — Telephone Encounter (Signed)
Message copied by Jasmine Awe on Wed Jan 09, 2014  1:45 PM ------      Message from: [place], BROOK E      Created: Wed Jan 09, 2014 12:50 PM       Please inform patient of negative urine culture.       Her blood work was unremarkable (mildly elevated platelets only), and I released these through My Chart.      She is coming in tomorrow for a recheck.      How is her pain doing? ------

## 2014-01-10 ENCOUNTER — Encounter: Payer: Self-pay | Admitting: Obstetrics and Gynecology

## 2014-01-10 ENCOUNTER — Ambulatory Visit (INDEPENDENT_AMBULATORY_CARE_PROVIDER_SITE_OTHER): Payer: 59 | Admitting: Obstetrics and Gynecology

## 2014-01-10 VITALS — BP 126/80 | HR 66 | Temp 98.6°F | Ht 63.0 in | Wt 166.4 lb

## 2014-01-10 DIAGNOSIS — K529 Noninfective gastroenteritis and colitis, unspecified: Secondary | ICD-10-CM

## 2014-01-10 DIAGNOSIS — K5289 Other specified noninfective gastroenteritis and colitis: Secondary | ICD-10-CM

## 2014-01-10 MED ORDER — ESTRADIOL 0.0375 MG/24HR TD PTTW: 1.0000 | MEDICATED_PATCH | TRANSDERMAL | Status: DC

## 2014-01-10 NOTE — Progress Notes (Signed)
Patient ID: [name], female   DOB: [birth year], 63 y.o.   MRN: [id number] GYNECOLOGY  VISIT   HPI: 63 y.o.   Married  Caucasian  female   [phone] with No LMP recorded. Patient has had a hysterectomy.   here for   Follow up.  Patient seen pelvic and low back pain.  Had a low grade fever to 99.1 in office, later up to 101 degrees. Had some nausea and diarrhea.  Urine had trace white and red blood cells and patient was started on Ciprofloxacin.  Normal WBC, Hgb, and blood chemistries.  Negative urine culture.   Feels much better.  Had a bowel movement.   Patient clarified that she is on Minvelle 0.0375 mg.   GYNECOLOGIC HISTORY: No LMP recorded. Patient has had a hysterectomy. Contraception:  hysterectomy  Menopausal hormone therapy: Vivelle-Dot 0.375mg         OB History   Grav Para Term Preterm Abortions TAB SAB Ect Mult Living   3 3 3       3          Patient Active Problem List   Diagnosis Date Noted  . Post-operative state 11/06/2013    Past Medical History  Diagnosis Date  . Bladder tumor 1992    BENIGN  . Fibroid   . Urinary urgency     Past Surgical History  Procedure Laterality Date  . Anterior cruciate ligament repair Right   . Bladder tumor excision  1992    benign  . Total abdominal hysterectomy w/ bilateral salpingoophorectomy  06/15/04    dermoid and AUB  . Abdominal hysterectomy    . Abdominal sacrocolpopexy N/A 11/06/2013    Procedure: ABDOMINO SACROCOLPOPEXY with Halban's culdoplasty;  Surgeon: Jamey Reas de Berton Lan, MD;  Location: [facility];  Service: Gynecology;  Laterality: N/A;  . Anterior and posterior repair N/A 11/06/2013    Procedure: ANTERIOR (CYSTOCELE) AND POSTERIOR REPAIR (RECTOCELE);  Surgeon: Jamey Reas de Berton Lan, MD;  Location: [facility];  Service: Gynecology;  Laterality: N/A;  . Bladder suspension N/A 11/06/2013    Procedure: TRANSVAGINAL TAPE (TVT) PROCEDURE exact midurethral sling;  Surgeon: Everardo All  Amundson de Berton Lan, MD;  Location: [facility];  Service: Gynecology;  Laterality: N/A;  . Cystoscopy N/A 11/06/2013    Procedure: CYSTOSCOPY;  Surgeon: Jamey Reas de Berton Lan, MD;  Location: [facility];  Service: Gynecology;  Laterality: N/A;    Current Outpatient Prescriptions  Medication Sig Dispense Refill  . cholecalciferol (VITAMIN D) 1000 UNITS tablet Take 1,000 Units by mouth daily.      . ciprofloxacin (CIPRO) 500 MG tablet Take 1 tablet (500 mg total) by mouth 2 (two) times daily.  14 tablet  0  . docusate sodium (COLACE) 100 MG capsule Take 100 mg by mouth. Takes 2 tablets at night      . estradiol (VIVELLE-DOT) 0.0375 MG/24HR Place 1 patch onto the skin 2 (two) times a week. Apply anywhere on lower abdomen, applies on Sun and Wed      . Multiple Vitamin (MULTIVITAMIN) tablet Take 1 tablet by mouth daily.      . polyethylene glycol powder (GLYCOLAX/MIRALAX) powder Take 0.5 Containers by mouth as needed.      . Probiotic Product (ALIGN) 4 MG CAPS Take 1 capsule by mouth daily.      Marland Kitchen PREMARIN vaginal cream Place 1 Applicatorful vaginally 2 (two) times a week.      Lisbeth Ply  4 MG TB24 tablet Take 4 mg by mouth daily.       No current facility-administered medications for this visit.     ALLERGIES: Sulfa antibiotics  Family History  Problem Relation Age of Onset  . Cancer Maternal Grandfather     History   Social History  . Marital Status: Married    Spouse Name: N/A    Number of Children: 3  . Years of Education: N/A   Occupational History  . Not on file.   Social History Main Topics  . Smoking status: Former Smoker    Types: Cigarettes    Quit date: 04/27/1973  . Smokeless tobacco: Never Used  . Alcohol Use: 8.4 oz/week    14 Glasses of wine per week     Comment: 10-14 glasses of wine a week  . Drug Use: No  . Sexual Activity: Yes    Partners: Male    Birth Control/ Protection: Surgical     Comment: vasectomy/TAH   Other Topics Concern  .  Not on file   Social History Narrative  . No narrative on file    ROS:  Pertinent items are noted in HPI.  PHYSICAL EXAMINATION:    BP 126/80  Pulse 66  Ht 5\' 3"  (1.6 m)  Wt 166 lb 6.4 oz (75.479 kg)  BMI 29.48 kg/m2     General appearance: alert, cooperative and appears stated age Abdomen: soft, non-tender; no masses,  no organomegaly No abnormal inguinal nodes palpated                      Bimanual Exam:  Normal external genitalia and urethral.                                       Bimanual exam - no midline nor adnexal masses or tenderness.  ASSESSMENT  Gastroenteritis post op.  No evidence of pelvic mass.   PLAN  Finish Ciprofloxacin antibiotics.  Return in 3 weeks for final post op check.    An After Visit Summary was printed and given to the patient.  __15____ minutes face to face time of which over 50% was spent in counseling.

## 2014-01-28 ENCOUNTER — Encounter: Payer: Self-pay | Admitting: Obstetrics and Gynecology

## 2014-01-28 ENCOUNTER — Other Ambulatory Visit: Payer: Self-pay

## 2014-01-28 ENCOUNTER — Ambulatory Visit (INDEPENDENT_AMBULATORY_CARE_PROVIDER_SITE_OTHER): Payer: 59 | Admitting: Obstetrics and Gynecology

## 2014-01-28 VITALS — BP 144/80 | HR 80 | Ht 63.0 in | Wt 167.4 lb

## 2014-01-28 DIAGNOSIS — Z1231 Encounter for screening mammogram for malignant neoplasm of breast: Secondary | ICD-10-CM

## 2014-01-28 DIAGNOSIS — Z9889 Other specified postprocedural states: Secondary | ICD-10-CM

## 2014-01-28 NOTE — Progress Notes (Signed)
Patient ID: [name], female   DOB: 1950-11-15, 63 y.o.   MRN: [id number] GYNECOLOGY  VISIT   HPI: 63 y.o.   Married  Caucasian  female   [phone] with No LMP recorded. Patient has had a hysterectomy.   here for follow up and final 3 month post op check.   Having some frequency of urination if drinks water.  Voids then every  Night time urination 2 times.  Had one urgency leakage episode.  Leaning forward to finish voiding.  Asking about Lisbeth Ply and starting again.   Wants to return to normal activities.   GYNECOLOGIC HISTORY: No LMP recorded. Patient has had a hysterectomy. Contraception: hysterectomy   Menopausal hormone therapy: Minivelle0.0375mg         OB History   Grav Para Term Preterm Abortions TAB SAB Ect Mult Living   3 3 3       3          Patient Active Problem List   Diagnosis Date Noted  . Post-operative state 11/06/2013    Past Medical History  Diagnosis Date  . Bladder tumor 1992    BENIGN  . Fibroid   . Urinary urgency     Past Surgical History  Procedure Laterality Date  . Anterior cruciate ligament repair Right   . Bladder tumor excision  1992    benign  . Total abdominal hysterectomy w/ bilateral salpingoophorectomy  06/15/04    dermoid and AUB  . Abdominal hysterectomy    . Abdominal sacrocolpopexy N/A 11/06/2013    Procedure: ABDOMINO SACROCOLPOPEXY with Halban's culdoplasty;  Surgeon: Jamey Reas de Berton Lan, MD;  Location: [facility];  Service: Gynecology;  Laterality: N/A;  . Anterior and posterior repair N/A 11/06/2013    Procedure: ANTERIOR (CYSTOCELE) AND POSTERIOR REPAIR (RECTOCELE);  Surgeon: Jamey Reas de Berton Lan, MD;  Location: [facility];  Service: Gynecology;  Laterality: N/A;  . Bladder suspension N/A 11/06/2013    Procedure: TRANSVAGINAL TAPE (TVT) PROCEDURE exact midurethral sling;  Surgeon: Everardo All Amundson de Berton Lan, MD;  Location: [facility];  Service: Gynecology;  Laterality: N/A;  . Cystoscopy  N/A 11/06/2013    Procedure: CYSTOSCOPY;  Surgeon: Jamey Reas de Berton Lan, MD;  Location: [facility];  Service: Gynecology;  Laterality: N/A;    Current Outpatient Prescriptions  Medication Sig Dispense Refill  . cholecalciferol (VITAMIN D) 1000 UNITS tablet Take 1,000 Units by mouth daily.      . ciprofloxacin (CIPRO) 500 MG tablet Take 1 tablet (500 mg total) by mouth 2 (two) times daily.  14 tablet  0  . docusate sodium (COLACE) 100 MG capsule Take 100 mg by mouth. Takes 2 tablets at night      . estradiol (MINIVELLE) 0.0375 MG/24HR Place 1 patch onto the skin 2 (two) times a week. Apply anywhere on lower abdomen.  Change patch on Sun am and Wed pm.  8 patch  0  . Multiple Vitamin (MULTIVITAMIN) tablet Take 1 tablet by mouth daily.      . polyethylene glycol powder (GLYCOLAX/MIRALAX) powder Take 0.5 Containers by mouth as needed.      Marland Kitchen PREMARIN vaginal cream Place 1 Applicatorful vaginally 2 (two) times a week.      . Probiotic Product (ALIGN) 4 MG CAPS Take 1 capsule by mouth daily.      . TOVIAZ 4 MG TB24 tablet Take 4 mg by mouth daily.       No current facility-administered  medications for this visit.     ALLERGIES: Sulfa antibiotics  Family History  Problem Relation Age of Onset  . Cancer Maternal Grandfather     History   Social History  . Marital Status: Married    Spouse Name: N/A    Number of Children: 3  . Years of Education: N/A   Occupational History  . Not on file.   Social History Main Topics  . Smoking status: Former Smoker    Types: Cigarettes    Quit date: 04/27/1973  . Smokeless tobacco: Never Used  . Alcohol Use: 8.4 oz/week    14 Glasses of wine per week     Comment: 10-14 glasses of wine a week  . Drug Use: No  . Sexual Activity: Yes    Partners: Male    Birth Control/ Protection: Surgical     Comment: vasectomy/TAH   Other Topics Concern  . Not on file   Social History Narrative  . No narrative on file    ROS:  Pertinent  items are noted in HPI.  PHYSICAL EXAMINATION:    BP 144/80  Pulse 80  Ht 5\' 3"  (1.6 m)  Wt 167 lb 6.4 oz (75.932 kg)  BMI 29.66 kg/m2     General appearance: alert, cooperative and appears stated age   Abdomen: pfannenstiel incision intact, soft, non-tender; no masses,  no organomegaly   Pelvic: External genitalia:  no lesions              Urethra:  normal appearing urethra with no masses, tenderness or lesions              Bartholins and Skenes: normal                 Vagina: normal appearing vagina with normal color and discharge, no lesions, excellent support, no sling or mesh erosions.  Good vaginal length.               Cervix:  absent                   Bimanual Exam:  Uterus:   absent                                      Adnexa:  no masses                                       ASSESSMENT  Doing well post op.  Overactive bladder syndrome.  PLAN  Premarin vaginal cream 1.2 gm pv at hs 2 -3 times per week.  Counseled on vaginal estrogen cream use. Restart Toviaz daily.  Return to all normal activities.  Mammogram this month.  Return for annual exam next month with [facility].    An After Visit Summary was printed and given to the patient.  20___ minutes face to face time of which over 50% was spent in counseling.   Vaginal estrogen Premarin 1/2 grm pv twice a week at hs.  Annual exam in October 2015.

## 2014-01-28 NOTE — Patient Instructions (Signed)
Keep your appointment for your mammogram in October with [facility].

## 2014-01-30 ENCOUNTER — Ambulatory Visit: Payer: 59 | Admitting: Obstetrics and Gynecology

## 2014-01-31 ENCOUNTER — Encounter: Payer: Self-pay | Admitting: Obstetrics and Gynecology

## 2014-01-31 ENCOUNTER — Other Ambulatory Visit: Payer: Self-pay | Admitting: Obstetrics and Gynecology

## 2014-01-31 MED ORDER — ESTROGENS, CONJUGATED 0.625 MG/GM VA CREA: TOPICAL_CREAM | VAGINAL | Status: DC

## 2014-02-01 ENCOUNTER — Ambulatory Visit: Payer: 59 | Admitting: Nurse Practitioner

## 2014-02-01 NOTE — Telephone Encounter (Signed)
Last AEX: 01/29/13 Last refill:01/10/14 # 8 patches X 0 Current AEX:03/07/14 Last MMG: 02/09/13 Bi-Rads Neg  Please advise

## 2014-02-11 ENCOUNTER — Ambulatory Visit
Admission: RE | Admit: 2014-02-11 | Discharge: 2014-02-11 | Disposition: A | Discharge status: Home / Self Care | Payer: 59 | Source: Ambulatory Visit

## 2014-02-11 DIAGNOSIS — Z1231 Encounter for screening mammogram for malignant neoplasm of breast: Secondary | ICD-10-CM

## 2014-03-04 ENCOUNTER — Telehealth: Payer: Self-pay | Admitting: *Deleted

## 2014-03-04 MED ORDER — ESTRADIOL 0.0375 MG/24HR TD PTTW: MEDICATED_PATCH | TRANSDERMAL | Status: DC

## 2014-03-04 NOTE — Telephone Encounter (Signed)
Fax From: [facility] for [place] 0.0375 mg  Last Refilled: 02/01/14 #8 patches with no refills Aex Scheduled: 03/07/14 with Dr. Quincy Simmonds Last Mammogram: 02/12/14 Bi-Rads 1: Negative  Minivelle 0.0375 #8 patches/0 rfs sent to [facility], fax faxed to [facility] stating this.  Routed to provider for review, encounter closed.

## 2014-03-07 ENCOUNTER — Ambulatory Visit (INDEPENDENT_AMBULATORY_CARE_PROVIDER_SITE_OTHER): Payer: 59 | Admitting: Nurse Practitioner

## 2014-03-07 ENCOUNTER — Encounter: Payer: Self-pay | Admitting: Nurse Practitioner

## 2014-03-07 VITALS — BP 136/60 | HR 74 | Resp 16 | Ht 62.5 in | Wt 164.0 lb

## 2014-03-07 DIAGNOSIS — G8918 Other acute postprocedural pain: Secondary | ICD-10-CM

## 2014-03-07 DIAGNOSIS — Z01419 Encounter for gynecological examination (general) (routine) without abnormal findings: Secondary | ICD-10-CM

## 2014-03-07 DIAGNOSIS — Z0000 Encounter for general adult medical examination without abnormal findings: Secondary | ICD-10-CM

## 2014-03-07 LAB — POCT URINALYSIS DIPSTICK
LEUKOCYTES UA: NEGATIVE
UROBILINOGEN UA: NEGATIVE
pH, UA: 5

## 2014-03-07 MED ORDER — ESTROGENS, CONJUGATED 0.625 MG/GM VA CREA: TOPICAL_CREAM | VAGINAL | Status: DC

## 2014-03-07 MED ORDER — ESTRADIOL 0.0375 MG/24HR TD PTTW: MEDICATED_PATCH | TRANSDERMAL | Status: DC

## 2014-03-07 MED ORDER — FESOTERODINE FUMARATE ER 4 MG PO TB24: 4.0000 mg | ORAL_TABLET | Freq: Every day | ORAL | Status: DC

## 2014-03-07 NOTE — Progress Notes (Signed)
Patient is scheduled for CT abdomen and pelvis with contrast at [facility] [place], [place] [address] scheduled 03/11/14 at 1445. Patient will be out of town and then pick up her instructions for oral contrast on Monday morning. Patient aware needs to pick up instructions and contrast very early Prior authorization attempted and peer to peer review required, information sent to Dr. Quincy Simmonds.  Case ID [id number] Call [phone] option 3.

## 2014-03-07 NOTE — Progress Notes (Signed)
63 y.o. G23P3003 Married Caucasian Fe here for annual exam.  She was having bladder prolapse symptoms and had abdominal sacrocolpexy 11/06/13.  Since then has done very well. She has no urinary incontinence and only occasional urge incontinence.  She does continue on [place]. No painful intercourse.  She did note this am a swelling or bulge on right lower abdomen that is best felt standing up.  Does not recall it being present before.  Patient's last menstrual period was 06/15/2004.          Sexually active: Yes.    The current method of family planning is status post hysterectomy.    Exercising: Yes.    Walking qd, tennis 2x q weekends Smoker:  no  Health Maintenance: Pap:  07/05/2005 Negative MMG:  02/12/14 Bi-Rads 1: Negative Colonoscopy:  2007-Diverticulosis f/u in 10 years, IFOB done 09/26/13 negative BMD:   03/21/13  TDaP:  10/09/13 Shingles vaccine : pending Pneumonia vaccine : pending Labs: Urine: Negative   reports that she quit smoking about 40 years ago. Her smoking use included Cigarettes. She smoked 0.00 packs per day. She has never used smokeless tobacco. She reports that she drinks about 8.4 ounces of alcohol per week. She reports that she does not use illicit drugs.  Past Medical History  Diagnosis Date  . Bladder tumor 1992    BENIGN  . Fibroid   . Urinary urgency     Past Surgical History  Procedure Laterality Date  . Anterior cruciate ligament repair Right   . Bladder tumor excision  1992    benign  . Total abdominal hysterectomy w/ bilateral salpingoophorectomy  06/15/04    dermoid and AUB  . Abdominal hysterectomy    . Abdominal sacrocolpopexy N/A 11/06/2013    Procedure: ABDOMINO SACROCOLPOPEXY with Halban's culdoplasty;  Surgeon: Jamey Reas de Berton Lan, MD;  Location: [facility];  Service: Gynecology;  Laterality: N/A;  . Anterior and posterior repair N/A 11/06/2013    Procedure: ANTERIOR (CYSTOCELE) AND POSTERIOR REPAIR (RECTOCELE);  Surgeon: Jamey Reas de Berton Lan, MD;  Location: [facility];  Service: Gynecology;  Laterality: N/A;  . Bladder suspension N/A 11/06/2013    Procedure: TRANSVAGINAL TAPE (TVT) PROCEDURE exact midurethral sling;  Surgeon: Everardo All Amundson de Berton Lan, MD;  Location: [facility];  Service: Gynecology;  Laterality: N/A;  . Cystoscopy N/A 11/06/2013    Procedure: CYSTOSCOPY;  Surgeon: Jamey Reas de Berton Lan, MD;  Location: [facility];  Service: Gynecology;  Laterality: N/A;    Current Outpatient Prescriptions  Medication Sig Dispense Refill  . cholecalciferol (VITAMIN D) 1000 UNITS tablet Take 1,000 Units by mouth daily.      Marland Kitchen conjugated estrogens (PREMARIN) vaginal cream Use 1/2 g vaginally 2-3 times a week  42.5 g  3  . docusate sodium (COLACE) 100 MG capsule Take 100 mg by mouth. Takes 2 tablets at night      . estradiol (MINIVELLE) 0.0375 MG/24HR PLACE 1 PATCH ONTO THE SKIN 2 TIMES WEEKLY ANYWHERE ON LOWE ABDOMEN,CHANGE ON SUN PM AND WED AM  24 patch  3  . fesoterodine (TOVIAZ) 4 MG TB24 tablet Take 1 tablet (4 mg total) by mouth daily.  90 tablet  3  . Multiple Vitamin (MULTIVITAMIN) tablet Take 1 tablet by mouth daily.      . polyethylene glycol powder (GLYCOLAX/MIRALAX) powder Take 0.5 Containers by mouth as needed.      . Probiotic Product (ALIGN) 4 MG CAPS Take  1 capsule by mouth daily.       No current facility-administered medications for this visit.    Family History  Problem Relation Age of Onset  . Cancer Maternal Grandfather     ROS:  Pertinent items are noted in HPI.  Otherwise, a comprehensive ROS was negative.  Exam:   BP 136/60  Pulse 74  Resp 16  Ht 5' 2.5" (1.588 m)  Wt 164 lb (74.39 kg)  BMI 29.50 kg/m2  LMP 06/15/2004 Height: 5' 2.5" (158.8 cm)  Ht Readings from Last 3 Encounters:  03/07/14 5' 2.5" (1.588 m)  01/28/14 5\' 3"  (1.6 m)  01/10/14 5\' 3"  (1.6 m)    General appearance: alert, cooperative and appears stated age Head: Normocephalic, without  obvious abnormality, atraumatic Neck: no adenopathy, supple, symmetrical, trachea midline and thyroid normal to inspection and palpation Lungs: clear to auscultation bilaterally Breasts: normal appearance, no masses or tenderness Heart: regular rate and rhythm Abdomen: soft, non-tender; no masses,  no organomegaly   In the upright position there is a bulge with firmness at the top of abdominal incisional area consistent with a hernia. In the supine position this is not palpable.  Pt. Was seen also by Dr. Quincy Simmonds. Extremities: extremities normal, atraumatic, no cyanosis or edema Skin: Skin color, texture, turgor normal. No rashes or lesions Lymph nodes: Cervical, supraclavicular, and axillary nodes normal. No abnormal inguinal nodes palpated Neurologic: Grossly normal   Pelvic: External genitalia:  no lesions              Urethra:  normal appearing urethra with no masses, tenderness or lesions              Bartholin's and Skene's: normal                 Vagina: normal appearing vagina with normal color and discharge, no lesions              Cervix: absent              Pap taken: No. Bimanual Exam:  Uterus:  uterus absent              Adnexa: no mass, fullness, tenderness               Rectovaginal: Confirms               Anus:  normal sphincter tone, no lesions  A:  Well Woman with normal exam  S/P TAH / BSO 06/15/04 secondary to AUB and dermoid with ERT since then   Vit D deficiency  S/P abdominal sacrocolpopexy 11/06/13  Right lower abdominal wall hernia - noted onset this am  P:   Reviewed health and wellness pertinent to exam  Pap smear not taken today  Mammogram is due 9/16  Per Dr. Quincy Simmonds will get abdominal and pelvic CT to evaluate right abdominal wall hernia  Refill on Minivelle 0.0375 twice weekly and Premarin vaginal cream for a year  Counseled with risk of CVA, DVT, cancer, etc  Counseled on breast self exam, mammography screening, use and side effects of HRT, adequate intake  of calcium and vitamin D, diet and exercise, Kegel's exercises return annually or prn  An After Visit Summary was printed and given to the patient.

## 2014-03-07 NOTE — Patient Instructions (Addendum)

## 2014-03-08 LAB — COMPREHENSIVE METABOLIC PANEL
ALT: 23 U/L (ref 0–35)
AST: 22 U/L (ref 0–37)
Albumin: 4.6 g/dL (ref 3.5–5.2)
Alkaline Phosphatase: 76 U/L (ref 39–117)
BILIRUBIN TOTAL: 0.4 mg/dL (ref 0.2–1.2)
BUN: 10 mg/dL (ref 6–23)
CHLORIDE: 102 meq/L (ref 96–112)
CO2: 30 mEq/L (ref 19–32)
CREATININE: 0.66 mg/dL (ref 0.50–1.10)
Calcium: 9.8 mg/dL (ref 8.4–10.5)
GLUCOSE: 103 mg/dL — AB (ref 70–99)
POTASSIUM: 4.8 meq/L (ref 3.5–5.3)
SODIUM: 140 meq/L (ref 135–145)
Total Protein: 7.3 g/dL (ref 6.0–8.3)

## 2014-03-10 NOTE — Progress Notes (Signed)
Encounter reviewed by Dr. Josefa Half. Patient examined at office visit and 3 - 4 cm nontender mass present superior to the right side of the Pfannenstiel incision.

## 2014-03-11 ENCOUNTER — Inpatient Hospital Stay: Admission: RE | Admit: 2014-03-11 | Payer: 59 | Source: Ambulatory Visit

## 2014-03-12 ENCOUNTER — Ambulatory Visit
Admission: RE | Admit: 2014-03-12 | Discharge: 2014-03-12 | Disposition: A | Discharge status: Home / Self Care | Payer: 59 | Source: Ambulatory Visit | Attending: Nurse Practitioner | Admitting: Nurse Practitioner

## 2014-03-12 DIAGNOSIS — G8918 Other acute postprocedural pain: Secondary | ICD-10-CM

## 2014-03-12 MED ORDER — IOHEXOL 300 MG/ML  SOLN
100.0000 mL | Freq: Once | INTRAMUSCULAR | Status: AC | PRN
  Administered 2014-03-12: 100 mL via INTRAVENOUS

## 2014-03-13 ENCOUNTER — Other Ambulatory Visit: Payer: Self-pay | Admitting: Obstetrics and Gynecology

## 2014-03-13 ENCOUNTER — Telehealth: Payer: Self-pay | Admitting: Obstetrics and Gynecology

## 2014-03-13 DIAGNOSIS — K449 Diaphragmatic hernia without obstruction or gangrene: Secondary | ICD-10-CM

## 2014-03-13 DIAGNOSIS — K429 Umbilical hernia without obstruction or gangrene: Secondary | ICD-10-CM

## 2014-03-13 DIAGNOSIS — K439 Ventral hernia without obstruction or gangrene: Secondary | ICD-10-CM

## 2014-03-13 NOTE — Telephone Encounter (Signed)
Phone call to discuss CT scan results. Patient has 3 hernias: 1 - Abdominal wall hernia with small bowel and no sign of obstruction. 2 - Small umbilical hernia. 3 - Moderate to large hiatal hernia.  I recommend general surgery consultation and patient requests to see Dr. Kaylyn Lim.  Referral order placed and will have office call tomorrow to make appointment. CT results released to the patient through My Chart. Precautions given regarding hernia and signs of bowel obstruction.

## 2014-03-14 ENCOUNTER — Telehealth: Payer: Self-pay | Admitting: Emergency Medicine

## 2014-03-14 NOTE — Telephone Encounter (Signed)
Message copied by Michele Mcalpine on Thu Mar 14, 2014 10:25 AM ------      Message from: [place], [place]: Wed Mar 13, 2014  8:39 PM       I have informed patient of her three hernias noted on the CT scan:      - large hiatal hernia.      - small umbilical hernia.      - abdominal wall hernia with bowel in the hernia sac. No sign of obstruction.             She would like to see Dr. Kaylyn Lim of [facility] for evaluation and treatment.       Please contact the patient tomorrow with an appointment time.        I am putting in an order.            Bowel obstruction precautions are given      Please reiterate to patient to go to ER for nausea, vomiting, abdominal pain, or fevers.      No heavy lifting or physical activity.             Cc- [address]. ------

## 2014-03-14 NOTE — Telephone Encounter (Signed)
[name],  Referral is placed by Dr. Quincy Simmonds, but [facility] surgical is no longer on EPIC.  Can you please send referral to [facility] surgical. Thank you.

## 2014-03-15 NOTE — Telephone Encounter (Signed)
Received incoming call from Dr. Earlie Server office. Advised by Davy Pique that patient must have at minimum an upper GI prior to scheduling with Dr. Hassell Done. Dr. Quincy Simmonds notified of request, refer to Dr. Collene Mares.   Spoke with patient she is established patient with Dr. Collene Mares.  Called Dr. Lorie Apley office. They will see patient, Tuesday 11/3 at 1500 for evaluation.  Advised patient needs upper GI per Dr. Carlye Grippe office.  Patient will need to see Dr. Collene Mares first. Patient notified, agreeable to appointment as scheduled.

## 2014-03-15 NOTE — Telephone Encounter (Signed)
Patient calling to check on the status of this. °

## 2014-03-18 ENCOUNTER — Encounter: Payer: Self-pay | Admitting: Nurse Practitioner

## 2014-04-03 ENCOUNTER — Other Ambulatory Visit (INDEPENDENT_AMBULATORY_CARE_PROVIDER_SITE_OTHER): Payer: Self-pay | Admitting: Surgery

## 2014-04-03 ENCOUNTER — Telehealth (INDEPENDENT_AMBULATORY_CARE_PROVIDER_SITE_OTHER): Payer: Self-pay

## 2014-04-03 DIAGNOSIS — K2100 Gastro-esophageal reflux disease with esophagitis, without bleeding: Secondary | ICD-10-CM

## 2014-04-03 NOTE — Telephone Encounter (Signed)
Pt seen in office today by Dr Hassell Done and orders placed in epic for UGI.

## 2014-04-05 NOTE — Telephone Encounter (Signed)
Spoke with representative at Dr. Lorie Apley office. She reviewed office visit notes. States patient has been seen by Dr. Collene Mares and Surgeon at [facility] surgical, Dr Hassell Done on 04/03/14. She states records were faxed to Dr. Elza Rafter attention.  Routing to provider for final review. Patient agreeable to disposition. Will close encounter

## 2014-04-06 ENCOUNTER — Other Ambulatory Visit: Payer: Self-pay | Admitting: Obstetrics and Gynecology

## 2014-04-07 NOTE — Telephone Encounter (Signed)
I received a visit note from Dr. Collene Mares, but I have not seen a note yet from Dr. Hassell Done.

## 2014-04-12 ENCOUNTER — Ambulatory Visit (HOSPITAL_COMMUNITY)
Admission: RE | Admit: 2014-04-12 | Discharge: 2014-04-12 | Disposition: A | Discharge status: Home / Self Care | Payer: 59 | Source: Ambulatory Visit | Attending: Surgery | Admitting: Surgery

## 2014-04-12 DIAGNOSIS — K2100 Gastro-esophageal reflux disease with esophagitis, without bleeding: Secondary | ICD-10-CM

## 2014-04-12 DIAGNOSIS — R05 Cough: Secondary | ICD-10-CM | POA: Diagnosis present

## 2014-04-12 DIAGNOSIS — K449 Diaphragmatic hernia without obstruction or gangrene: Secondary | ICD-10-CM | POA: Diagnosis present

## 2014-04-25 ENCOUNTER — Ambulatory Visit (INDEPENDENT_AMBULATORY_CARE_PROVIDER_SITE_OTHER): Payer: Self-pay | Admitting: Surgery

## 2014-04-25 DIAGNOSIS — K449 Diaphragmatic hernia without obstruction or gangrene: Secondary | ICD-10-CM

## 2014-04-25 DIAGNOSIS — K219 Gastro-esophageal reflux disease without esophagitis: Secondary | ICD-10-CM | POA: Insufficient documentation

## 2014-04-25 DIAGNOSIS — K439 Ventral hernia without obstruction or gangrene: Secondary | ICD-10-CM | POA: Insufficient documentation

## 2014-04-25 NOTE — Telephone Encounter (Signed)
Dr. Quincy Simmonds,  Patient had upper GI on 04/12/14 and appointment today with Dr. Hassell Done at [facility].

## 2014-04-25 NOTE — H&P (Addendum)
Chief Complaint:  Chronic cough, GERD, and large hiatus hernia with Spigellian hernia on the right  History of Present Illness:  [name] is an 63 y.o. female who developed a right lower quadrant bulge in October.  She has had a chronic cough and GERD.  UGI shows a large hiatus hernia.    I have explained laparoscopic repair of this hiatal hernia and concomitant repair of the Spigellian hernia.  Will schedule.    Past Medical History  Diagnosis Date  . Bladder tumor 1992    BENIGN  . Fibroid   . Urinary urgency     Past Surgical History  Procedure Laterality Date  . Anterior cruciate ligament repair Right   . Bladder tumor excision  1992    benign  . Total abdominal hysterectomy w/ bilateral salpingoophorectomy  06/15/04    dermoid and AUB  . Abdominal hysterectomy    . Abdominal sacrocolpopexy N/A 11/06/2013    Procedure: ABDOMINO SACROCOLPOPEXY with Halban's culdoplasty;  Surgeon: Jamey Reas de Berton Lan, MD;  Location: [facility];  Service: Gynecology;  Laterality: N/A;  . Anterior and posterior repair N/A 11/06/2013    Procedure: ANTERIOR (CYSTOCELE) AND POSTERIOR REPAIR (RECTOCELE);  Surgeon: Jamey Reas de Berton Lan, MD;  Location: [facility];  Service: Gynecology;  Laterality: N/A;  . Bladder suspension N/A 11/06/2013    Procedure: TRANSVAGINAL TAPE (TVT) PROCEDURE exact midurethral sling;  Surgeon: Everardo All Amundson de Berton Lan, MD;  Location: [facility];  Service: Gynecology;  Laterality: N/A;  . Cystoscopy N/A 11/06/2013    Procedure: CYSTOSCOPY;  Surgeon: Jamey Reas de Berton Lan, MD;  Location: [facility];  Service: Gynecology;  Laterality: N/A;    Current Outpatient Prescriptions  Medication Sig Dispense Refill  . cholecalciferol (VITAMIN D) 1000 UNITS tablet Take 1,000 Units by mouth daily.    Marland Kitchen conjugated estrogens (PREMARIN) vaginal cream Use 1/2 g vaginally 2-3 times a week 42.5 g 3  . docusate sodium (COLACE) 100 MG capsule Take  100 mg by mouth. Takes 2 tablets at night    . estradiol (MINIVELLE) 0.0375 MG/24HR PLACE 1 PATCH ONTO THE SKIN 2 TIMES WEEKLY ANYWHERE ON LOWE ABDOMEN,CHANGE ON SUN PM AND WED AM 24 patch 3  . fesoterodine (TOVIAZ) 4 MG TB24 tablet Take 1 tablet (4 mg total) by mouth daily. 90 tablet 3  . Multiple Vitamin (MULTIVITAMIN) tablet Take 1 tablet by mouth daily.    . polyethylene glycol powder (GLYCOLAX/MIRALAX) powder Take 0.5 Containers by mouth as needed.    . Probiotic Product (ALIGN) 4 MG CAPS Take 1 capsule by mouth daily.     No current facility-administered medications for this visit.   Sulfa antibiotics Family History  Problem Relation Age of Onset  . Cancer Maternal Grandfather    Social History:   reports that she quit smoking about 41 years ago. Her smoking use included Cigarettes. She smoked 0.00 packs per day. She has never used smokeless tobacco. She reports that she drinks about 8.4 oz of alcohol per week. She reports that she does not use illicit drugs.   REVIEW OF SYSTEMS : Negative except for see problem list  Physical Exam:   Last menstrual period 06/15/2004. There is no weight on file to calculate BMI.  Gen:  WDWN WF NAD  Neurological: Alert and oriented to person, place, and time. Motor and sensory function is grossly intact  Head: Normocephalic and atraumatic.  Eyes: Conjunctivae are normal. Pupils are  equal, round, and reactive to light. No scleral icterus.  Neck: Normal range of motion. Neck supple. No tracheal deviation or thyromegaly present.  Cardiovascular:  SR without murmurs or gallops.  No carotid bruits Breast:  Not examined Respiratory: Effort normal.  No respiratory distress. No chest wall tenderness. Breath sounds normal.  No wheezes, rales or rhonchi.  Abdomen:  Pffanenstiel incision.  Right lower abdominal bulge especially when standing.   GU:  Not examined Musculoskeletal: Normal range of motion. Extremities are nontender. No cyanosis, edema or  clubbing noted Lymphadenopathy: No cervical, preauricular, postauricular or axillary adenopathy is present Skin: Skin is warm and dry. No rash noted. No diaphoresis. No erythema. No pallor. Pscyh: Normal mood and affect. Behavior is normal. Judgment and thought content normal.   LABORATORY RESULTS: No results found for this or any previous visit (from the past 48 hour(s)).   RADIOLOGY RESULTS: No results found.  Problem List: Patient Active Problem List   Diagnosis Date Noted  . Hiatal hernia 04/25/2014  . GERD (gastroesophageal reflux disease) 04/25/2014  . Spigelian hernia-right 04/25/2014    Assessment & Plan: Large type III hiatus hernia with GERD and chronic cough exacerbating her Spigellian hernia Lap repair os hiatus hernia with Nissen and repair of Spigellian hernia    Matt B. Hassell Done, MD, [facility] [phone] beeper [phone]  04/25/2014 12:14 PM

## 2014-04-29 ENCOUNTER — Other Ambulatory Visit: Payer: Self-pay | Admitting: Nurse Practitioner

## 2014-05-16 ENCOUNTER — Encounter (HOSPITAL_COMMUNITY): Payer: Self-pay

## 2014-05-16 NOTE — Patient Instructions (Addendum)
ABIGAL CHOUNG  05/16/2014   Your procedure is scheduled on: 05/27/14   Report to [facility]  Entrance and follow signs to               [place] at 8:45  AM.   Call this number if you have problems the morning of surgery [phone]   Remember:  Do not eat food or drink liquids :After Midnight.     Take these medicines the morning of surgery with A SIP OF WATER: TOVIAZ             STOP ASPIRIN AND HERBAL MEDS 5 DAYS PREOP             USE FLEET ENEMA THE NIGHT BEFORE SURGERY                               You may not have any metal on your body including hair pins and              piercings  Do not wear jewelry, make-up, lotions, powders or perfumes.             Do not wear nail polish.  Do not shave  48 hours prior to surgery.              Men may shave face and neck.   Do not bring valuables to the hospital. [place].  Contacts, dentures or bridgework may not be worn into surgery.  Leave suitcase in the car. After surgery it may be brought to your room.     Patients discharged the day of surgery will not be allowed to drive home.  Name and phone number of your driver:  Special Instructions: N/A              Please read over the following fact sheets you were given: _____________________________________________________________________                                                     [place]  Before surgery, you can play an important role.  Because skin is not sterile, your skin needs to be as free of germs as possible.  You can reduce the number of germs on your skin by washing with CHG (chlorahexidine gluconate) soap before surgery.  CHG is an antiseptic cleaner which kills germs and bonds with the skin to continue killing germs even after washing. Please DO NOT use if you have an allergy to CHG or antibacterial soaps.  If your skin becomes  reddened/irritated stop using the CHG and inform your nurse when you arrive at Short Stay. Do not shave (including legs and underarms) for at least 48 hours prior to the first CHG shower.  You may shave your face. Please follow these instructions carefully:   1.  Shower with CHG Soap the night before surgery and the  morning of Surgery.   2.  If you choose to wash your hair, wash your hair first as usual with your  normal  Shampoo.   3.  After you  shampoo, rinse your hair and body thoroughly to remove the  shampoo.                                         4.  Use CHG as you would any other liquid soap.  You can apply chg directly  to the skin and wash . Gently wash with scrungie or clean wascloth    5.  Apply the CHG Soap to your body ONLY FROM THE NECK DOWN.   Do not use on open                           Wound or open sores. Avoid contact with eyes, ears mouth and genitals (private parts).                        Genitals (private parts) with your normal soap.              6.  Wash thoroughly, paying special attention to the area where your surgery  will be performed.   7.  Thoroughly rinse your body with warm water from the neck down.   8.  DO NOT shower/wash with your normal soap after using and rinsing off  the CHG Soap .                9.  Pat yourself dry with a clean towel.             10.  Wear clean pajamas.             11.  Place clean sheets on your bed the night of your first shower and do not  sleep with pets.  Day of Surgery : Do not apply any lotions/deodorants the morning of surgery.  Please wear clean clothes to the hospital/surgery center.  FAILURE TO FOLLOW THESE INSTRUCTIONS MAY RESULT IN THE CANCELLATION OF YOUR SURGERY    PATIENT SIGNATURE_________________________________  ______________________________________________________________________

## 2014-05-17 DIAGNOSIS — R7989 Other specified abnormal findings of blood chemistry: Secondary | ICD-10-CM

## 2014-05-17 HISTORY — DX: Other specified abnormal findings of blood chemistry: R79.89

## 2014-05-20 ENCOUNTER — Encounter (HOSPITAL_COMMUNITY): Payer: Self-pay

## 2014-05-20 ENCOUNTER — Encounter (HOSPITAL_COMMUNITY)
Admission: RE | Admit: 2014-05-20 | Discharge: 2014-05-20 | Disposition: A | Discharge status: Home / Self Care | Payer: 59 | Source: Ambulatory Visit | Attending: Surgery | Admitting: Surgery

## 2014-05-20 DIAGNOSIS — R9389 Abnormal findings on diagnostic imaging of other specified body structures: Secondary | ICD-10-CM

## 2014-05-20 DIAGNOSIS — Z01812 Encounter for preprocedural laboratory examination: Secondary | ICD-10-CM | POA: Diagnosis present

## 2014-05-20 HISTORY — DX: Gastro-esophageal reflux disease without esophagitis: K21.9

## 2014-05-20 HISTORY — DX: Claustrophobia: F40.240

## 2014-05-20 LAB — CBC
HEMATOCRIT: 44.1 % (ref 36.0–46.0)
Hemoglobin: 14.6 g/dL (ref 12.0–15.0)
MCH: 32.1 pg (ref 26.0–34.0)
MCHC: 33.1 g/dL (ref 30.0–36.0)
MCV: 96.9 fL (ref 78.0–100.0)
Platelets: 351 10*3/uL (ref 150–400)
RBC: 4.55 MIL/uL (ref 3.87–5.11)
RDW: 12.8 % (ref 11.5–15.5)
WBC: 6.9 10*3/uL (ref 4.0–10.5)

## 2014-05-20 LAB — BASIC METABOLIC PANEL
ANION GAP: 4 — AB (ref 5–15)
BUN: 10 mg/dL (ref 6–23)
CO2: 29 mmol/L (ref 19–32)
Calcium: 9.5 mg/dL (ref 8.4–10.5)
Chloride: 105 mEq/L (ref 96–112)
Creatinine, Ser: 0.61 mg/dL (ref 0.50–1.10)
GFR calc non Af Amer: >90 mL/min (ref >90)
GLUCOSE: 103 mg/dL — AB (ref 70–99)
POTASSIUM: 5 mmol/L (ref 3.5–5.1)
Sodium: 138 mmol/L (ref 135–145)

## 2014-05-27 ENCOUNTER — Encounter (HOSPITAL_COMMUNITY)
Admission: RE | Disposition: A | Discharge status: Home / Self Care | Payer: Self-pay | Source: Ambulatory Visit | Attending: Surgery

## 2014-05-27 ENCOUNTER — Encounter (HOSPITAL_COMMUNITY): Payer: Self-pay | Admitting: Anesthesiology

## 2014-05-27 ENCOUNTER — Inpatient Hospital Stay (HOSPITAL_COMMUNITY)
Admission: RE | Admit: 2014-05-27 | Discharge: 2014-05-30 | DRG: 328 | Disposition: A | Discharge status: Home / Self Care | Payer: 59 | Source: Ambulatory Visit | Attending: Surgery | Admitting: Surgery

## 2014-05-27 ENCOUNTER — Ambulatory Visit (HOSPITAL_COMMUNITY): Payer: 59 | Admitting: Anesthesiology

## 2014-05-27 DIAGNOSIS — J40 Bronchitis, not specified as acute or chronic: Secondary | ICD-10-CM | POA: Diagnosis present

## 2014-05-27 DIAGNOSIS — K449 Diaphragmatic hernia without obstruction or gangrene: Principal | ICD-10-CM

## 2014-05-27 DIAGNOSIS — K219 Gastro-esophageal reflux disease without esophagitis: Secondary | ICD-10-CM | POA: Diagnosis present

## 2014-05-27 DIAGNOSIS — K439 Ventral hernia without obstruction or gangrene: Secondary | ICD-10-CM | POA: Diagnosis present

## 2014-05-27 DIAGNOSIS — Z87891 Personal history of nicotine dependence: Secondary | ICD-10-CM | POA: Diagnosis not present

## 2014-05-27 DIAGNOSIS — Z9889 Other specified postprocedural states: Secondary | ICD-10-CM

## 2014-05-27 HISTORY — PX: HIATAL HERNIA REPAIR: SHX195

## 2014-05-27 HISTORY — PX: SPIGELIAN HERNIA: SHX6100

## 2014-05-27 HISTORY — PX: INSERTION OF MESH: SHX5868

## 2014-05-27 LAB — CBC
HEMATOCRIT: 41.1 % (ref 36.0–46.0)
Hemoglobin: 13.5 g/dL (ref 12.0–15.0)
MCH: 31.8 pg (ref 26.0–34.0)
MCHC: 32.8 g/dL (ref 30.0–36.0)
MCV: 96.7 fL (ref 78.0–100.0)
Platelets: 345 10*3/uL (ref 150–400)
RBC: 4.25 MIL/uL (ref 3.87–5.11)
RDW: 12.7 % (ref 11.5–15.5)
WBC: 18.3 10*3/uL — ABNORMAL HIGH (ref 4.0–10.5)

## 2014-05-27 LAB — CREATININE, SERUM
CREATININE: 0.77 mg/dL (ref 0.50–1.10)
GFR calc Af Amer: >90 mL/min (ref >90)
GFR calc non Af Amer: 89 mL/min — ABNORMAL LOW (ref >90)

## 2014-05-27 SURGERY — REPAIR, HERNIA, HIATAL, LAPAROSCOPIC
Anesthesia: General | Site: Abdomen | Laterality: Right

## 2014-05-27 MED ORDER — SODIUM CHLORIDE 0.9 % IJ SOLN
INTRAMUSCULAR | Status: AC
  Filled 2014-05-27: qty 10

## 2014-05-27 MED ORDER — TISSEEL VH 10 ML EX KIT
PACK | CUTANEOUS | Status: AC
  Filled 2014-05-27: qty 1

## 2014-05-27 MED ORDER — KCL IN DEXTROSE-NACL 20-5-0.45 MEQ/L-%-% IV SOLN
INTRAVENOUS | Status: DC
  Administered 2014-05-27 – 2014-05-28 (×4): via INTRAVENOUS
  Filled 2014-05-27 (×6): qty 1000

## 2014-05-27 MED ORDER — OXYCODONE HCL 5 MG/5ML PO SOLN: 5.0000 mg | ORAL | Status: DC | PRN

## 2014-05-27 MED ORDER — MORPHINE SULFATE 10 MG/ML IJ SOLN: 2.0000 mg | INTRAMUSCULAR | Status: DC | PRN

## 2014-05-27 MED ORDER — GLYCOPYRROLATE 0.2 MG/ML IJ SOLN
INTRAMUSCULAR | Status: DC | PRN
  Administered 2014-05-27: 0.6 mg via INTRAVENOUS

## 2014-05-27 MED ORDER — SUFENTANIL CITRATE 50 MCG/ML IV SOLN
INTRAVENOUS | Status: AC
  Filled 2014-05-27: qty 1

## 2014-05-27 MED ORDER — LACTATED RINGERS IV SOLN: INTRAVENOUS | Status: DC

## 2014-05-27 MED ORDER — HYDROMORPHONE HCL 2 MG/ML IJ SOLN
INTRAMUSCULAR | Status: AC
  Filled 2014-05-27: qty 1

## 2014-05-27 MED ORDER — PROPOFOL 10 MG/ML IV BOLUS
INTRAVENOUS | Status: AC
  Filled 2014-05-27: qty 20

## 2014-05-27 MED ORDER — PROPOFOL 10 MG/ML IV BOLUS
INTRAVENOUS | Status: DC | PRN
  Administered 2014-05-27: 180 mg via INTRAVENOUS

## 2014-05-27 MED ORDER — LACTATED RINGERS IV SOLN
INTRAVENOUS | Status: DC
  Administered 2014-05-27: 1000 mL via INTRAVENOUS

## 2014-05-27 MED ORDER — NEOSTIGMINE METHYLSULFATE 10 MG/10ML IV SOLN
INTRAVENOUS | Status: DC | PRN
  Administered 2014-05-27: 4 mg via INTRAVENOUS

## 2014-05-27 MED ORDER — GLYCOPYRROLATE 0.2 MG/ML IJ SOLN
INTRAMUSCULAR | Status: AC
  Filled 2014-05-27: qty 3

## 2014-05-27 MED ORDER — DEXTROSE 5 % IV SOLN
INTRAVENOUS | Status: AC
  Filled 2014-05-27: qty 2

## 2014-05-27 MED ORDER — METOCLOPRAMIDE HCL 5 MG/ML IJ SOLN
INTRAMUSCULAR | Status: DC | PRN
  Administered 2014-05-27: 10 mg via INTRAVENOUS

## 2014-05-27 MED ORDER — ONDANSETRON HCL 4 MG/2ML IJ SOLN
INTRAMUSCULAR | Status: DC | PRN
  Administered 2014-05-27: 4 mg via INTRAVENOUS

## 2014-05-27 MED ORDER — LIDOCAINE HCL (CARDIAC) 20 MG/ML IV SOLN
INTRAVENOUS | Status: DC | PRN
Start: 2014-05-27 — End: 2014-05-27
  Administered 2014-05-27 (×2): 50 mg via INTRAVENOUS

## 2014-05-27 MED ORDER — LACTATED RINGERS IR SOLN
Status: DC | PRN
  Administered 2014-05-27: 1000 mL

## 2014-05-27 MED ORDER — HEPARIN SODIUM (PORCINE) 5000 UNIT/ML IJ SOLN
5000.0000 [IU] | Freq: Three times a day (TID) | INTRAMUSCULAR | Status: DC
  Administered 2014-05-27 – 2014-05-29 (×5): 5000 [IU] via SUBCUTANEOUS
  Filled 2014-05-27 (×9): qty 1

## 2014-05-27 MED ORDER — DEXAMETHASONE SODIUM PHOSPHATE 10 MG/ML IJ SOLN
INTRAMUSCULAR | Status: DC | PRN
  Administered 2014-05-27: 5 mg via INTRAVENOUS

## 2014-05-27 MED ORDER — LACTATED RINGERS IV SOLN
INTRAVENOUS | Status: DC | PRN
  Administered 2014-05-27 (×3): via INTRAVENOUS

## 2014-05-27 MED ORDER — ACETAMINOPHEN 160 MG/5ML PO SOLN
325.0000 mg | ORAL | Status: DC | PRN
  Filled 2014-05-27: qty 20.3

## 2014-05-27 MED ORDER — ACETAMINOPHEN 10 MG/ML IV SOLN
1000.0000 mg | Freq: Four times a day (QID) | INTRAVENOUS | Status: AC
  Administered 2014-05-27 – 2014-05-28 (×4): 1000 mg via INTRAVENOUS
  Filled 2014-05-27 (×7): qty 100

## 2014-05-27 MED ORDER — METOCLOPRAMIDE HCL 5 MG/ML IJ SOLN
INTRAMUSCULAR | Status: AC
  Filled 2014-05-27: qty 2

## 2014-05-27 MED ORDER — NEOSTIGMINE METHYLSULFATE 10 MG/10ML IV SOLN
INTRAVENOUS | Status: AC
  Filled 2014-05-27: qty 1

## 2014-05-27 MED ORDER — SUFENTANIL CITRATE 50 MCG/ML IV SOLN
INTRAVENOUS | Status: DC | PRN
Start: 2014-05-27 — End: 2014-05-27
  Administered 2014-05-27 (×4): 5 ug via INTRAVENOUS
  Administered 2014-05-27: 15 ug via INTRAVENOUS
  Administered 2014-05-27: 10 ug via INTRAVENOUS
  Administered 2014-05-27: 5 ug via INTRAVENOUS
  Administered 2014-05-27: 10 ug via INTRAVENOUS
  Administered 2014-05-27 (×2): 5 ug via INTRAVENOUS
  Administered 2014-05-27: 15 ug via INTRAVENOUS
  Administered 2014-05-27 (×2): 5 ug via INTRAVENOUS

## 2014-05-27 MED ORDER — HYDROMORPHONE HCL 1 MG/ML IJ SOLN: 0.2500 mg | INTRAMUSCULAR | Status: DC | PRN

## 2014-05-27 MED ORDER — MIDAZOLAM HCL 2 MG/2ML IJ SOLN
INTRAMUSCULAR | Status: AC
  Filled 2014-05-27: qty 2

## 2014-05-27 MED ORDER — HEPARIN SODIUM (PORCINE) 5000 UNIT/ML IJ SOLN
5000.0000 [IU] | Freq: Once | INTRAMUSCULAR | Status: AC
  Administered 2014-05-27: 5000 [IU] via SUBCUTANEOUS
  Filled 2014-05-27: qty 1

## 2014-05-27 MED ORDER — DEXTROSE 5 % IV SOLN
2.0000 g | INTRAVENOUS | Status: AC
  Administered 2014-05-27 (×2): 2 g via INTRAVENOUS

## 2014-05-27 MED ORDER — CETYLPYRIDINIUM CHLORIDE 0.05 % MT LIQD
7.0000 mL | Freq: Two times a day (BID) | OROMUCOSAL | Status: DC
  Administered 2014-05-27 – 2014-05-28 (×2): 7 mL via OROMUCOSAL

## 2014-05-27 MED ORDER — ROCURONIUM BROMIDE 100 MG/10ML IV SOLN
INTRAVENOUS | Status: DC | PRN
  Administered 2014-05-27: 20 mg via INTRAVENOUS
  Administered 2014-05-27: 10 mg via INTRAVENOUS
  Administered 2014-05-27: 20 mg via INTRAVENOUS
  Administered 2014-05-27: 25 mg via INTRAVENOUS
  Administered 2014-05-27: 15 mg via INTRAVENOUS
  Administered 2014-05-27: 10 mg via INTRAVENOUS
  Administered 2014-05-27: 5 mg via INTRAVENOUS

## 2014-05-27 MED ORDER — HYDROMORPHONE HCL 1 MG/ML IJ SOLN
INTRAMUSCULAR | Status: DC | PRN
Start: 2014-05-27 — End: 2014-05-27
  Administered 2014-05-27 (×2): 0.5 mg via INTRAVENOUS
  Administered 2014-05-27: 1 mg via INTRAVENOUS

## 2014-05-27 MED ORDER — MIDAZOLAM HCL 5 MG/5ML IJ SOLN
INTRAMUSCULAR | Status: DC | PRN
  Administered 2014-05-27: 2 mg via INTRAVENOUS

## 2014-05-27 MED ORDER — LIP MEDEX EX OINT
TOPICAL_OINTMENT | CUTANEOUS | Status: AC
  Administered 2014-05-27: 1
  Filled 2014-05-27: qty 7

## 2014-05-27 MED ORDER — BUPIVACAINE LIPOSOME 1.3 % IJ SUSP
20.0000 mL | Freq: Once | INTRAMUSCULAR | Status: AC
  Administered 2014-05-27: 20 mL
  Filled 2014-05-27: qty 20

## 2014-05-27 MED ORDER — LIDOCAINE HCL (CARDIAC) 20 MG/ML IV SOLN
INTRAVENOUS | Status: AC
  Filled 2014-05-27: qty 5

## 2014-05-27 MED ORDER — ONDANSETRON HCL 4 MG/2ML IJ SOLN
4.0000 mg | INTRAMUSCULAR | Status: DC | PRN
  Administered 2014-05-28: 4 mg via INTRAVENOUS
  Filled 2014-05-27: qty 2

## 2014-05-27 MED ORDER — ROCURONIUM BROMIDE 100 MG/10ML IV SOLN
INTRAVENOUS | Status: AC
  Filled 2014-05-27: qty 1

## 2014-05-27 MED ORDER — LABETALOL HCL 5 MG/ML IV SOLN
INTRAVENOUS | Status: DC | PRN
  Administered 2014-05-27: 2.5 mg via INTRAVENOUS

## 2014-05-27 MED ORDER — CHLORHEXIDINE GLUCONATE 4 % EX LIQD: 1.0000 "application " | Freq: Once | CUTANEOUS | Status: DC

## 2014-05-27 MED ORDER — SUCCINYLCHOLINE CHLORIDE 20 MG/ML IJ SOLN
INTRAMUSCULAR | Status: DC | PRN
  Administered 2014-05-27: 100 mg via INTRAVENOUS

## 2014-05-27 MED ORDER — LABETALOL HCL 5 MG/ML IV SOLN
INTRAVENOUS | Status: AC
  Filled 2014-05-27: qty 4

## 2014-05-27 MED ORDER — BUPIVACAINE LIPOSOME 1.3 % IJ SUSP
20.0000 mL | Freq: Once | INTRAMUSCULAR | Status: DC
  Filled 2014-05-27: qty 20

## 2014-05-27 MED ORDER — ONDANSETRON HCL 4 MG/2ML IJ SOLN
INTRAMUSCULAR | Status: AC
  Filled 2014-05-27: qty 2

## 2014-05-27 MED ORDER — 0.9 % SODIUM CHLORIDE (POUR BTL) OPTIME
TOPICAL | Status: DC | PRN
  Administered 2014-05-27: 1000 mL

## 2014-05-27 SURGICAL SUPPLY — 61 items
APL SKNCLS STERI-STRIP NONHPOA (GAUZE/BANDAGES/DRESSINGS)
APL SRG 32X5 SNPLK LF DISP (MISCELLANEOUS) ×3
APPLIER CLIP ROT 10 11.4 M/L (STAPLE)
APR CLP MED LRG 11.4X10 (STAPLE)
BENZOIN TINCTURE PRP APPL 2/3 (GAUZE/BANDAGES/DRESSINGS) IMPLANT
BINDER ABDOMINAL 12 ML 46-62 (SOFTGOODS) ×5 IMPLANT
CABLE HIGH FREQUENCY MONO STRZ (ELECTRODE) IMPLANT
CLAMP ENDO BABCK 10MM (STAPLE) IMPLANT
CLIP APPLIE ROT 10 11.4 M/L (STAPLE) IMPLANT
CLOSURE WOUND 1/2 X4 (GAUZE/BANDAGES/DRESSINGS)
DECANTER SPIKE VIAL GLASS SM (MISCELLANEOUS) ×5 IMPLANT
DEVICE SECURE STRAP 25 ABSORB (INSTRUMENTS) ×5 IMPLANT
DEVICE SUT QUICK LOAD TK 5 (STAPLE) ×36 IMPLANT
DEVICE SUT TI-KNOT TK 5X26 (MISCELLANEOUS) ×4 IMPLANT
DEVICE SUTURE ENDOST 10MM (ENDOMECHANICALS) ×5 IMPLANT
DEVICE TI KNOT TK5 (MISCELLANEOUS) ×1
DEVICE TROCAR PUNCTURE CLOSURE (ENDOMECHANICALS) ×5 IMPLANT
DISSECTOR BLUNT TIP ENDO 5MM (MISCELLANEOUS) ×5 IMPLANT
DRAIN PENROSE 18X1/2 LTX STRL (DRAIN) ×5 IMPLANT
DRAPE LAPAROSCOPIC ABDOMINAL (DRAPES) ×5 IMPLANT
ELECT REM PT RETURN 9FT ADLT (ELECTROSURGICAL) ×5
ELECTRODE REM PT RTRN 9FT ADLT (ELECTROSURGICAL) ×3 IMPLANT
FILTER SMOKE EVAC LAPAROSHD (FILTER) IMPLANT
GLOVE BIOGEL M 8.0 STRL (GLOVE) ×5 IMPLANT
GOWN SPEC L4 XLG W/TWL (GOWN DISPOSABLE) ×5 IMPLANT
GOWN STRL REUS W/TWL XL LVL3 (GOWN DISPOSABLE) ×30 IMPLANT
GRASPER ENDO BABCOCK 10 (MISCELLANEOUS) IMPLANT
GRASPER ENDO BABCOCK 10MM (MISCELLANEOUS)
KIT BASIN OR (CUSTOM PROCEDURE TRAY) ×5 IMPLANT
LIQUID BAND (GAUZE/BANDAGES/DRESSINGS) ×5 IMPLANT
MESH HERNIA 7X10 (Mesh General) ×5 IMPLANT
MESH PARIETEX 4.7 (Mesh General) ×5 IMPLANT
PENCIL BUTTON HOLSTER BLD 10FT (ELECTRODE) ×5 IMPLANT
QUICK LOAD TK 5 (STAPLE) ×9
SCISSORS LAP 5X45 EPIX DISP (ENDOMECHANICALS) ×5 IMPLANT
SEALANT SURGICAL APPL DUAL CAN (MISCELLANEOUS) ×5 IMPLANT
SET IRRIG TUBING LAPAROSCOPIC (IRRIGATION / IRRIGATOR) ×5 IMPLANT
SHEARS HARMONIC ACE PLUS 45CM (MISCELLANEOUS) ×5 IMPLANT
SLEEVE ADV FIXATION 12X100MM (TROCAR) IMPLANT
SLEEVE ADV FIXATION 5X100MM (TROCAR) ×15 IMPLANT
SOLUTION ANTI FOG 6CC (MISCELLANEOUS) ×5 IMPLANT
STAPLER VISISTAT 35W (STAPLE) ×5 IMPLANT
STRIP CLOSURE SKIN 1/2X4 (GAUZE/BANDAGES/DRESSINGS) IMPLANT
SUT ETHIBOND 2 0 SH (SUTURE) ×15
SUT ETHIBOND 2 0 SH 36X2 (SUTURE) ×9 IMPLANT
SUT NOVA 1 T20/GS 25DT (SUTURE) ×5 IMPLANT
SUT NOVA NAB DX-16 0-1 5-0 T12 (SUTURE) ×10 IMPLANT
SUT SURGIDAC NAB ES-9 0 48 120 (SUTURE) ×30 IMPLANT
SUT VIC AB 4-0 SH 18 (SUTURE) ×5 IMPLANT
TIP INNERVISION DETACH 40FR (MISCELLANEOUS) IMPLANT
TIP INNERVISION DETACH 50FR (MISCELLANEOUS) IMPLANT
TIP INNERVISION DETACH 56FR (MISCELLANEOUS) IMPLANT
TIPS INNERVISION DETACH 40FR (MISCELLANEOUS)
TOWEL OR 17X26 10 PK STRL BLUE (TOWEL DISPOSABLE) ×10 IMPLANT
TOWEL OR NON WOVEN STRL DISP B (DISPOSABLE) ×5 IMPLANT
TRAY FOLEY CATH 14FRSI W/METER (CATHETERS) ×5 IMPLANT
TRAY LAPAROSCOPIC (CUSTOM PROCEDURE TRAY) ×5 IMPLANT
TROCAR ADV FIXATION 12X100MM (TROCAR) ×5 IMPLANT
TROCAR ADV FIXATION 5X100MM (TROCAR) ×5 IMPLANT
TROCAR BLADELESS OPT 5 100 (ENDOMECHANICALS) ×5 IMPLANT
TUBING FILTER THERMOFLATOR (ELECTROSURGICAL) ×5 IMPLANT

## 2014-05-27 NOTE — Anesthesia Postprocedure Evaluation (Signed)
  Anesthesia Post-op Note  Patient: [name]  Procedure(s) Performed: Procedure(s) (LRB): LAPAROSCOPIC REPAIR OF HIATAL HERNIA WITH MESH (N/A) RIGHT SPIGELIAN HERNIA REPAIR WITH MESH (Right) INSERTION OF MESH  Patient Location: PACU  Anesthesia Type: General  Level of Consciousness: awake and alert   Airway and Oxygen Therapy: Patient Spontanous Breathing  Post-op Pain: mild  Post-op Assessment: Post-op Vital signs reviewed, Patient's Cardiovascular Status Stable, Respiratory Function Stable, Patent Airway and No signs of Nausea or vomiting  Last Vitals:  Filed Vitals:   05/27/14 1500  BP: 165/68  Pulse: 69  Temp:   Resp: 11    Post-op Vital Signs: stable   Complications: No apparent anesthesia complications

## 2014-05-27 NOTE — Transfer of Care (Signed)
Immediate Anesthesia Transfer of Care Note  Patient: [name]  Procedure(s) Performed: Procedure(s) with comments: LAPAROSCOPIC REPAIR OF HIATAL HERNIA WITH MESH (N/A) RIGHT SPIGELIAN HERNIA REPAIR WITH MESH (Right) INSERTION OF MESH - two different mesh used for two different sites  Patient Location: PACU  Anesthesia Type:General  Level of Consciousness: awake, alert , oriented and patient cooperative  Airway & Oxygen Therapy: Patient Spontanous Breathing and Patient connected to nasal cannula oxygen  Post-op Assessment: Report given to PACU RN and Post -op Vital signs reviewed and stable  Post vital signs: Reviewed  Complications: No apparent anesthesia complications

## 2014-05-27 NOTE — Anesthesia Preprocedure Evaluation (Addendum)
Anesthesia Evaluation  Patient identified by MRN, date of birth, ID band Patient awake    Reviewed: Allergy & Precautions, H&P , NPO status , Patient's Chart, lab work & pertinent test results  History of Anesthesia Complications Negative for: history of anesthetic complications  Airway Mallampati: II  TM Distance: >3 FB Neck ROM: Full    Dental no notable dental hx. (+) Teeth Intact, Dental Advisory Given   Pulmonary neg pulmonary ROS, neg sleep apnea, neg COPDneg recent URI, former smoker,  breath sounds clear to auscultation  Pulmonary exam normal       Cardiovascular Exercise Tolerance: Good negative cardio ROS  Rhythm:Regular Rate:Normal     Neuro/Psych Anxiety claustrophobianegative neurological ROS  negative psych ROS   GI/Hepatic negative GI ROS, Neg liver ROS, hiatal hernia, GERD-  Medicated and Poorly Controlled,  Endo/Other  negative endocrine ROS  Renal/GU negative Renal ROS  negative genitourinary   Musculoskeletal   Abdominal   Peds  Hematology negative hematology ROS (+)   Anesthesia Other Findings   Reproductive/Obstetrics negative OB ROS                            Anesthesia Physical Anesthesia Plan  ASA: II  Anesthesia Plan: General   Post-op Pain Management:    Induction: Intravenous  Airway Management Planned: Oral ETT  Additional Equipment:   Intra-op Plan:   Post-operative Plan: Extubation in OR  Informed Consent: I have reviewed the patients History and Physical, chart, labs and discussed the procedure including the risks, benefits and alternatives for the proposed anesthesia with the patient or authorized representative who has indicated his/her understanding and acceptance.   Dental Advisory Given  Plan Discussed with: CRNA and Surgeon  Anesthesia Plan Comments:         Anesthesia Quick Evaluation

## 2014-05-27 NOTE — Op Note (Signed)
Surgeon: Kaylyn Lim, MD, FACS  Asst:  Adonis Housekeeper, M.D., FACS  Anes:  Gen. endotracheal  Procedure: Laparoscopic takedown and repair of hiatal hernia (type III mixed) with Lacinda Axon biological mesh, Nissen fundoplication over a 8 dilator, repair of right lower quadrants Spigelian hernia with primary closure and internal reinforcement with 12 cm round Parietex coated mesh  Diagnosis: Hiatus hernia with reflux and bronchitis, enlarging Spigelian hernia  Complications: None noted  Op time:  3 hours  EBL:   20 cc  Drains: none  Description of Procedure:  The patient was taken to OR 1 at [facility].  After anesthesia was administered and the patient was prepped a timeout was performed.  Access to abdomen was achieved with 5 mm Optiview technique.  Following insufflation I was able to survey the abdomen and noted the spaghetti only a hernia in the right lower quadrant which I previously marked. There were some adhesions in the midline which would need to be partially taken down when that would be repaired at the end of the case. Standard trochars including at 7782 to the right of the midline and the rest fives were used to set up the hiatal hernia repair. The Nathanson retractor was used to retract the left lateral segment of the liver and expose a very visible obvious type III mixed hiatal hernia. With traction this was reduced into the abdomen partially and with dissection using the Harmonic Scalpel I was able to excise the old sac and that reduces completely. This was performed by starting on the patient's right side and taking down the hernia sac from the right crus. We pulled this down and then came across the top and then over to the patient's left side. I took down short gastric vessels enabling me to then get beneath the esophagogastric junction and placed a Penrose drain. With a Penrose drain in place and with retraction we were able to then free up get good mobilization of the esophagus into the  abdomen.   I then repaired the hiatus placing 3 sutures of 0 Surgidek using the Ryder System. These were placed posteriorly and really, snug things up pretty well although I had an adequate amount of room. All 3 were placed and secured with tie knots. At that point I use a piece of coated biological mesh it is cut and prepared for diaphragmatic repairs. It was placed posteriorly in the legs were placed anteriorly. There were secured anteriorly to the right and left crura. Some Tisseel was applied to help him be more secure. In the meantime we passed the 56 lighted bougie on well and the stomach to the transition Mark. This showed that the hiatal hernia repair was closed just right.  We had do it due to a little more mobilization but in the and I was able to reach around and get a piece of the  fundus and bring that around and perform a shoeshine maneuver. With the 56 dilator in place and at the transition point on the light I then completed the Nissen fundoplication with 3 sutures of 0 Surgidek using the Endo Stitch getting purchases of stomach distal esophagus stomach and securing them with tie knots.  The wrap looked good. We removed the liver retractor.  Next we focused on the right lower quadrant hernia. I took down some of the midline adhesions. There may be a hernia starting to form is more broadbase on the left side. This discrete hernia that however we made a transverse incision over this  and then I dissected free the hernia sac. I amputated the hernia sac and then closed the hernia transversely with interrupted #1 Novafils. Prior to completely closing this we placed a piece of 12 cm diameter circular coated prior Tex mesh. On the noncoated side there was #1 Novafils placed at 90. He was placed in unrolled and under laparoscopic vision who when retrieved the for holding sutures then pulled them up and tied them down. I then used the secure strap stapler to tack the mesh to the abdominal wall with  the coated side toward the bowel. Everything appeared to be in order. All the incisions were injected with Exparel and closed with 4-0 Vicryl's. The 1112 trocar site was opened a little more and if anterior fascia was closed 0 Vicryl and then 4-0 Vicryl scan. Liquid band was placed on all the incisions. Abdominal binder will be placed and the patient was awakened taken recovery room in satisfactory condition.   The patient tolerated the procedure well and was taken to the PACU in stable condition.     Matt B. Hassell Done, [place], [facility], [place]

## 2014-05-27 NOTE — Interval H&P Note (Signed)
History and Physical Interval Note:  05/27/2014 10:23 AM  [name]  has presented today for surgery, with the diagnosis of HIATAL AND SPIGELIAN HERNIAS  The various methods of treatment have been discussed with the patient and family. After consideration of risks, benefits and other options for treatment, the patient has consented to  Procedure(s) with comments: [place] (N/A) - possible egd RIGHT SPIGELIAN HERNIA REPAIR (Right) as a surgical intervention .  The patient's history has been reviewed, patient examined, no change in status, stable for surgery.  I have reviewed the patient's chart and labs.  Questions were answered to the patient's satisfaction.     Amerah Puleo B

## 2014-05-27 NOTE — H&P (Signed)
Chief Complaint:  Spigelian and Type III mixed hiatus hernia  History of Present Illness:  [name] is an 64 y.o. female patient who has GERD, a hiatus hernia and increased coughing producing an right lower quadrant Spigelian hernia to develop and become symptomatic.  UGI confirms a large hiatus hernia  Past Medical History  Diagnosis Date  . Urinary urgency   . GERD (gastroesophageal reflux disease)   . Bladder tumor     BENIGN  . Claustrophobia     Past Surgical History  Procedure Laterality Date  . Anterior cruciate ligament repair Right   . Bladder tumor excision  1992    benign  . Total abdominal hysterectomy w/ bilateral salpingoophorectomy  06/15/04    dermoid and AUB  . Abdominal hysterectomy    . Abdominal sacrocolpopexy N/A 11/06/2013    Procedure: ABDOMINO SACROCOLPOPEXY with Halban's culdoplasty;  Surgeon: Jamey Reas de Berton Lan, MD;  Location: [facility];  Service: Gynecology;  Laterality: N/A;  . Anterior and posterior repair N/A 11/06/2013    Procedure: ANTERIOR (CYSTOCELE) AND POSTERIOR REPAIR (RECTOCELE);  Surgeon: Jamey Reas de Berton Lan, MD;  Location: [place] [facility];  Service: Gynecology;  Laterality: N/A;  . Bladder suspension N/A 11/06/2013    Procedure: TRANSVAGINAL TAPE (TVT) PROCEDURE exact midurethral sling;  Surgeon: Everardo All Amundson de Berton Lan, MD;  Location: [place] [facility];  Service: Gynecology;  Laterality: N/A;  . Cystoscopy N/A 11/06/2013    Procedure: CYSTOSCOPY;  Surgeon: Jamey Reas de Berton Lan, MD;  Location: [place] [facility];  Service: Gynecology;  Laterality: N/A;    Current Facility-Administered Medications  Medication Dose Route Frequency Provider Last Rate Last Dose  . cefOXitin (MEFOXIN) 2 g in dextrose 5 % 50 mL IVPB  2 g Intravenous On Call to OR Pedro Earls, MD      . chlorhexidine (HIBICLENS) 4 % liquid 1 application  1 application Topical Once Pedro Earls, MD      . lactated ringers infusion    Intravenous Continuous Nickie Retort, MD 125 mL/hr at 05/27/14 0944 1,000 mL at 05/27/14 0944   Sulfa antibiotics Family History  Problem Relation Age of Onset  . Cancer Maternal Grandfather    Social History:   reports that she quit smoking about 41 years ago. Her smoking use included Cigarettes. She smoked 0.00 packs per day. She has never used smokeless tobacco. She reports that she drinks about 8.4 oz of alcohol per week. She reports that she does not use illicit drugs.   REVIEW OF SYSTEMS : Negative except for palpable hernia in RLQ and symptoms of GERD  Physical Exam:   Blood pressure 151/83, pulse 72, temperature 98.3 F (36.8 C), temperature source Oral, resp. rate 18, height 5\' 3"  (1.6 m), weight 170 lb (77.111 kg), last menstrual period 06/15/2004, SpO2 100 %. Body mass index is 30.12 kg/(m^2).  Gen:  WDWN WF NAD  Neurological: Alert and oriented to person, place, and time. Motor and sensory function is grossly intact  Head: Normocephalic and atraumatic.  Eyes: Conjunctivae are normal. Pupils are equal, round, and reactive to light. No scleral icterus.  Neck: Normal range of motion. Neck supple. No tracheal deviation or thyromegaly present.  Cardiovascular:  SR without murmurs or gallops.  No carotid bruits Breast:  Not examined Respiratory: Effort normal.  No respiratory distress. No chest wall tenderness. Breath sounds normal.  No wheezes, rales or rhonchi.  Abdomen:  Right lower quadrant hernia (  marked) GU:  Not examined Musculoskeletal: Normal range of motion. Extremities are nontender. No cyanosis, edema or clubbing noted Lymphadenopathy: No cervical, preauricular, postauricular or axillary adenopathy is present Skin: Skin is warm and dry. No rash noted. No diaphoresis. No erythema. No pallor. Pscyh: Normal mood and affect. Behavior is normal. Judgment and thought content normal.   LABORATORY RESULTS: No results found for this or any previous visit (from the past 48  hour(s)).   RADIOLOGY RESULTS: No results found.  Problem List: Patient Active Problem List   Diagnosis Date Noted  . Hiatal hernia 04/25/2014  . GERD (gastroesophageal reflux disease) 04/25/2014  . Spigelian hernia-right 04/25/2014    Assessment & Plan: Symptomatic hiatal hernia with GERD and Spigelian hernia;  Plan repair of both    Matt B. Hassell Done, MD, [facility] [phone] beeper [phone]  05/27/2014 10:18 AM

## 2014-05-28 ENCOUNTER — Inpatient Hospital Stay (HOSPITAL_COMMUNITY): Payer: 59

## 2014-05-28 ENCOUNTER — Encounter (HOSPITAL_COMMUNITY): Payer: Self-pay | Admitting: Surgery

## 2014-05-28 ENCOUNTER — Other Ambulatory Visit: Payer: Self-pay | Admitting: Nurse Practitioner

## 2014-05-28 LAB — CBC WITH DIFFERENTIAL/PLATELET
Basophils Absolute: 0 10*3/uL (ref 0.0–0.1)
Basophils Relative: 0 % (ref 0–1)
Eosinophils Absolute: 0 10*3/uL (ref 0.0–0.7)
Eosinophils Relative: 0 % (ref 0–5)
HCT: 33.2 % — ABNORMAL LOW (ref 36.0–46.0)
HEMOGLOBIN: 10.9 g/dL — AB (ref 12.0–15.0)
LYMPHS ABS: 1.7 10*3/uL (ref 0.7–4.0)
Lymphocytes Relative: 15 % (ref 12–46)
MCH: 31.9 pg (ref 26.0–34.0)
MCHC: 32.8 g/dL (ref 30.0–36.0)
MCV: 97.1 fL (ref 78.0–100.0)
MONOS PCT: 10 % (ref 3–12)
Monocytes Absolute: 1.2 10*3/uL — ABNORMAL HIGH (ref 0.1–1.0)
Neutro Abs: 8.5 10*3/uL — ABNORMAL HIGH (ref 1.7–7.7)
Neutrophils Relative %: 75 % (ref 43–77)
Platelets: 332 10*3/uL (ref 150–400)
RBC: 3.42 MIL/uL — AB (ref 3.87–5.11)
RDW: 12.8 % (ref 11.5–15.5)
WBC: 11.3 10*3/uL — ABNORMAL HIGH (ref 4.0–10.5)

## 2014-05-28 MED ORDER — ZOLPIDEM TARTRATE 5 MG PO TABS
5.0000 mg | ORAL_TABLET | Freq: Every evening | ORAL | Status: DC | PRN
  Administered 2014-05-28 – 2014-05-29 (×2): 5 mg via ORAL
  Filled 2014-05-28 (×2): qty 1

## 2014-05-28 MED ORDER — IOHEXOL 300 MG/ML  SOLN
50.0000 mL | Freq: Once | INTRAMUSCULAR | Status: AC | PRN
Start: 2014-05-28 — End: 2014-05-28
  Administered 2014-05-28: 40 mL via ORAL

## 2014-05-28 NOTE — Telephone Encounter (Signed)
Medication refill request: [place]  Last AEX:  03/07/14 Next AEX: 03/13/14 Last MMG (if hormonal medication request): 02/12/14 [id number]:neg Refill authorized: 03/07/14 #[id number] [facility]

## 2014-05-28 NOTE — Care Management Note (Signed)
    Page 1 of 1   05/28/2014     1:45:38 PM CARE MANAGEMENT NOTE 05/28/2014  Patient:  [name], [name]   Account Number:  [id number]  Date Initiated:  05/28/2014  Documentation initiated by:  Sunday Spillers  Subjective/Objective Assessment:   64 yo female admitted s/p nissen fundoplication. PTA lived at home with spouse.     Action/Plan:   Home when stable   Anticipated DC Date:  05/30/2014   Anticipated DC Plan:  [place]  CM consult      Choice offered to / List presented to:             Status of service:  Completed, signed off Medicare Important Message given?   (If response is "NO", the following Medicare IM given date fields will be blank) Date Medicare IM given:   Medicare IM given by:   Date Additional Medicare IM given:   Additional Medicare IM given by:    Discharge Disposition:  HOME/SELF CARE  Per UR Regulation:  Reviewed for med. necessity/level of care/duration of stay  If discussed at [place] of Stay Meetings, dates discussed:    Comments:

## 2014-05-28 NOTE — Progress Notes (Signed)
Patient ID: [name], female   DOB: 03-25-1951, 64 y.o.   MRN: [id number] [facility] Progress Note:   1 Day Post-Op  Subjective: Mental status is clear.  Pain is mainly in the right lower quadrant Objective: Vital signs in last 24 hours: Temp:  [97.6 F (36.4 C)-98.6 F (37 C)] 98 F (36.7 C) (01/12 1000) Pulse Rate:  [54-76] 76 (01/12 1000) Resp:  [11-18] 16 (01/12 1000) BP: (112-165)/(59-77) 137/59 mmHg (01/12 1000) SpO2:  [95 %-100 %] 98 % (01/12 1000)  Intake/Output from previous day: 01/11 0701 - 01/12 0700 In: 4103.3 [I.V.:3803.3; IV Piggyback:300] Out: 1085 [Urine:1045; Blood:40] Intake/Output this shift: Total I/O In: -  Out: 250 [Urine:250]  Physical Exam: Work of breathing is normal.    Lab Results:  Results for orders placed or performed during the hospital encounter of 05/27/14 (from the past 48 hour(s))  CBC     Status: Abnormal   Collection Time: 05/27/14  3:43 PM  Result Value Ref Range   WBC 18.3 (H) 4.0 - 10.5 K/uL   RBC 4.25 3.87 - 5.11 MIL/uL   Hemoglobin 13.5 12.0 - 15.0 g/dL   HCT 41.1 36.0 - 46.0 %   MCV 96.7 78.0 - 100.0 fL   MCH 31.8 26.0 - 34.0 pg   MCHC 32.8 30.0 - 36.0 g/dL   RDW 12.7 11.5 - 15.5 %   Platelets 345 150 - 400 K/uL  Creatinine, serum     Status: Abnormal   Collection Time: 05/27/14  3:43 PM  Result Value Ref Range   Creatinine, Ser 0.77 0.50 - 1.10 mg/dL   GFR calc non Af Amer 89 (L) >90 mL/min   GFR calc Af Amer >90 >90 mL/min    Comment: (NOTE) The eGFR has been calculated using the CKD EPI equation. This calculation has not been validated in all clinical situations. eGFR's persistently <90 mL/min signify possible Chronic Kidney Disease.   CBC WITH DIFFERENTIAL     Status: Abnormal   Collection Time: 05/28/14  5:25 AM  Result Value Ref Range   WBC 11.3 (H) 4.0 - 10.5 K/uL   RBC 3.42 (L) 3.87 - 5.11 MIL/uL   Hemoglobin 10.9 (L) 12.0 - 15.0 g/dL    Comment: REPEATED TO VERIFY DELTA CHECK NOTED     HCT 33.2 (L) 36.0 - 46.0 %   MCV 97.1 78.0 - 100.0 fL   MCH 31.9 26.0 - 34.0 pg   MCHC 32.8 30.0 - 36.0 g/dL   RDW 12.8 11.5 - 15.5 %   Platelets 332 150 - 400 K/uL   Neutrophils Relative % 75 43 - 77 %   Neutro Abs 8.5 (H) 1.7 - 7.7 K/uL   Lymphocytes Relative 15 12 - 46 %   Lymphs Abs 1.7 0.7 - 4.0 K/uL   Monocytes Relative 10 3 - 12 %   Monocytes Absolute 1.2 (H) 0.1 - 1.0 K/uL   Eosinophils Relative 0 0 - 5 %   Eosinophils Absolute 0.0 0.0 - 0.7 K/uL   Basophils Relative 0 0 - 1 %   Basophils Absolute 0.0 0.0 - 0.1 K/uL    Radiology/Results: Dg Ugi W/water Sol Cm  05/28/2014   CLINICAL DATA:  Hiatal hernia repair with Nissen fundoplication  EXAM: WATER SOLUBLE UPPER GI SERIES  TECHNIQUE: Single-column upper GI series was performed using water soluble contrast.  CONTRAST:  71mL OMNIPAQUE IOHEXOL 300 MG/ML  SOLN  COMPARISON:  04/12/2014  FLUOROSCOPY TIME:  0 min, 45 seconds  FINDINGS: Initial KUB demonstrates atelectasis in the left lower lobe.  No leak in the vicinity of the wrap identified. Maximum luminal caliber in the vicinity of the wrap currently approximately 8 mm. contrast extend through the wrap region and into the stomach. No recurrent hiatal hernia.  IMPRESSION: 1. Normal appearance status post hiatal hernia repair and fundoplication. No leak.   Electronically Signed   By: Sherryl Barters M.D.   On: 05/28/2014 11:17    Anti-infectives: Anti-infectives    Start     Dose/Rate Route Frequency Ordered Stop   05/27/14 0901  cefOXitin (MEFOXIN) 2 g in dextrose 5 % 50 mL IVPB     2 g100 mL/hr over 30 Minutes Intravenous On call to O.R. 05/27/14 0901 05/27/14 1246      Assessment/Plan: Problem List: Patient Active Problem List   Diagnosis Date Noted  . Hiatal hernia 04/25/2014  . GERD (gastroesophageal reflux disease) 04/25/2014  . Spigelian hernia-right 04/25/2014    UGI looks good.  Will begin clear liquids PO 1 Day Post-Op    LOS: 1 day   Matt B. Hassell Done, MD,  [facility] [phone] beeper [phone]  05/28/2014 1:49 PM

## 2014-05-29 LAB — CBC WITH DIFFERENTIAL/PLATELET
BASOS ABS: 0 10*3/uL (ref 0.0–0.1)
Basophils Relative: 0 % (ref 0–1)
EOS ABS: 0.2 10*3/uL (ref 0.0–0.7)
Eosinophils Relative: 2 % (ref 0–5)
HCT: 29.1 % — ABNORMAL LOW (ref 36.0–46.0)
HEMOGLOBIN: 9.6 g/dL — AB (ref 12.0–15.0)
LYMPHS ABS: 1.6 10*3/uL (ref 0.7–4.0)
Lymphocytes Relative: 17 % (ref 12–46)
MCH: 32.2 pg (ref 26.0–34.0)
MCHC: 33 g/dL (ref 30.0–36.0)
MCV: 97.7 fL (ref 78.0–100.0)
MONOS PCT: 11 % (ref 3–12)
Monocytes Absolute: 1 10*3/uL (ref 0.1–1.0)
NEUTROS PCT: 70 % (ref 43–77)
Neutro Abs: 6.5 10*3/uL (ref 1.7–7.7)
Platelets: 281 10*3/uL (ref 150–400)
RBC: 2.98 MIL/uL — AB (ref 3.87–5.11)
RDW: 12.9 % (ref 11.5–15.5)
WBC: 9.3 10*3/uL (ref 4.0–10.5)

## 2014-05-29 NOTE — Progress Notes (Signed)
Patient ID: [name], female   DOB: [birth year], 64 y.o.   MRN: [id number] [facility] Progress Note:   2 Days Post-Op  Subjective: Mental status is clear.  Feeling well. No complaints except soreness in RLQ when walking.  Objective: Vital signs in last 24 hours: Temp:  [98 F (36.7 C)-99.2 F (37.3 C)] 98.7 F (37.1 C) (01/13 0544) Pulse Rate:  [76-93] 93 (01/13 0544) Resp:  [16-18] 18 (01/13 0544) BP: (137-150)/(59-65) 150/65 mmHg (01/13 0544) SpO2:  [95 %-98 %] 97 % (01/13 0544)  Intake/Output from previous day: 01/12 0701 - 01/13 0700 In: 2288.3 [P.O.:1080; I.V.:1208.3] Out: 3750 [Urine:3750] Intake/Output this shift:    Physical Exam: Work of breathing is not labored.  Incisions OK  Lab Results:  Results for orders placed or performed during the hospital encounter of 05/27/14 (from the past 48 hour(s))  CBC     Status: Abnormal   Collection Time: 05/27/14  3:43 PM  Result Value Ref Range   WBC 18.3 (H) 4.0 - 10.5 K/uL   RBC 4.25 3.87 - 5.11 MIL/uL   Hemoglobin 13.5 12.0 - 15.0 g/dL   HCT 41.1 36.0 - 46.0 %   MCV 96.7 78.0 - 100.0 fL   MCH 31.8 26.0 - 34.0 pg   MCHC 32.8 30.0 - 36.0 g/dL   RDW 12.7 11.5 - 15.5 %   Platelets 345 150 - 400 K/uL  Creatinine, serum     Status: Abnormal   Collection Time: 05/27/14  3:43 PM  Result Value Ref Range   Creatinine, Ser 0.77 0.50 - 1.10 mg/dL   GFR calc non Af Amer 89 (L) >90 mL/min   GFR calc Af Amer >90 >90 mL/min    Comment: (NOTE) The eGFR has been calculated using the CKD EPI equation. This calculation has not been validated in all clinical situations. eGFR's persistently <90 mL/min signify possible Chronic Kidney Disease.   CBC WITH DIFFERENTIAL     Status: Abnormal   Collection Time: 05/28/14  5:25 AM  Result Value Ref Range   WBC 11.3 (H) 4.0 - 10.5 K/uL   RBC 3.42 (L) 3.87 - 5.11 MIL/uL   Hemoglobin 10.9 (L) 12.0 - 15.0 g/dL    Comment: REPEATED TO VERIFY DELTA CHECK NOTED    HCT 33.2  (L) 36.0 - 46.0 %   MCV 97.1 78.0 - 100.0 fL   MCH 31.9 26.0 - 34.0 pg   MCHC 32.8 30.0 - 36.0 g/dL   RDW 12.8 11.5 - 15.5 %   Platelets 332 150 - 400 K/uL   Neutrophils Relative % 75 43 - 77 %   Neutro Abs 8.5 (H) 1.7 - 7.7 K/uL   Lymphocytes Relative 15 12 - 46 %   Lymphs Abs 1.7 0.7 - 4.0 K/uL   Monocytes Relative 10 3 - 12 %   Monocytes Absolute 1.2 (H) 0.1 - 1.0 K/uL   Eosinophils Relative 0 0 - 5 %   Eosinophils Absolute 0.0 0.0 - 0.7 K/uL   Basophils Relative 0 0 - 1 %   Basophils Absolute 0.0 0.0 - 0.1 K/uL  CBC with Differential     Status: Abnormal   Collection Time: 05/29/14  5:22 AM  Result Value Ref Range   WBC 9.3 4.0 - 10.5 K/uL   RBC 2.98 (L) 3.87 - 5.11 MIL/uL   Hemoglobin 9.6 (L) 12.0 - 15.0 g/dL   HCT 29.1 (L) 36.0 - 46.0 %   MCV 97.7 78.0 - 100.0 fL  MCH 32.2 26.0 - 34.0 pg   MCHC 33.0 30.0 - 36.0 g/dL   RDW 12.9 11.5 - 15.5 %   Platelets 281 150 - 400 K/uL   Neutrophils Relative % 70 43 - 77 %   Neutro Abs 6.5 1.7 - 7.7 K/uL   Lymphocytes Relative 17 12 - 46 %   Lymphs Abs 1.6 0.7 - 4.0 K/uL   Monocytes Relative 11 3 - 12 %   Monocytes Absolute 1.0 0.1 - 1.0 K/uL   Eosinophils Relative 2 0 - 5 %   Eosinophils Absolute 0.2 0.0 - 0.7 K/uL   Basophils Relative 0 0 - 1 %   Basophils Absolute 0.0 0.0 - 0.1 K/uL    Radiology/Results: Dg Ugi W/water Sol Cm  05/28/2014   CLINICAL DATA:  Hiatal hernia repair with Nissen fundoplication  EXAM: WATER SOLUBLE UPPER GI SERIES  TECHNIQUE: Single-column upper GI series was performed using water soluble contrast.  CONTRAST:  35m OMNIPAQUE IOHEXOL 300 MG/ML  SOLN  COMPARISON:  04/12/2014  FLUOROSCOPY TIME:  0 min, 45 seconds  FINDINGS: Initial KUB demonstrates atelectasis in the left lower lobe.  No leak in the vicinity of the wrap identified. Maximum luminal caliber in the vicinity of the wrap currently approximately 8 mm. contrast extend through the wrap region and into the stomach. No recurrent hiatal hernia.   IMPRESSION: 1. Normal appearance status post hiatal hernia repair and fundoplication. No leak.   Electronically Signed   By: WSherryl BartersM.D.   On: 05/28/2014 11:17    Anti-infectives: Anti-infectives    Start     Dose/Rate Route Frequency Ordered Stop   05/27/14 0901  cefOXitin (MEFOXIN) 2 g in dextrose 5 % 50 mL IVPB     2 g100 mL/hr over 30 Minutes Intravenous On call to O.R. 05/27/14 0901 05/27/14 1246      Assessment/Plan: Problem List: Patient Active Problem List   Diagnosis Date Noted  . Hiatal hernia 04/25/2014  . GERD (gastroesophageal reflux disease) 04/25/2014  . Spigelian hernia-right 04/25/2014    HG drop from 13.5 to 9.6.  Will hold heparin and recheck in am.  Not ready for discharge.   2 Days Post-Op    LOS: 2 days   Matt B. MHassell Done MD, [facility] [phone] [phone] 05/29/2014 8:46 AM

## 2014-05-30 DIAGNOSIS — Z9889 Other specified postprocedural states: Secondary | ICD-10-CM

## 2014-05-30 LAB — CBC WITH DIFFERENTIAL/PLATELET
Basophils Absolute: 0 10*3/uL (ref 0.0–0.1)
Basophils Relative: 1 % (ref 0–1)
EOS ABS: 0.7 10*3/uL (ref 0.0–0.7)
EOS PCT: 8 % — AB (ref 0–5)
HEMATOCRIT: 29.3 % — AB (ref 36.0–46.0)
Hemoglobin: 9.5 g/dL — ABNORMAL LOW (ref 12.0–15.0)
LYMPHS ABS: 2.2 10*3/uL (ref 0.7–4.0)
LYMPHS PCT: 26 % (ref 12–46)
MCH: 32.1 pg (ref 26.0–34.0)
MCHC: 32.4 g/dL (ref 30.0–36.0)
MCV: 99 fL (ref 78.0–100.0)
Monocytes Absolute: 0.8 10*3/uL (ref 0.1–1.0)
Monocytes Relative: 9 % (ref 3–12)
NEUTROS PCT: 56 % (ref 43–77)
Neutro Abs: 4.8 10*3/uL (ref 1.7–7.7)
Platelets: 305 10*3/uL (ref 150–400)
RBC: 2.96 MIL/uL — ABNORMAL LOW (ref 3.87–5.11)
RDW: 12.9 % (ref 11.5–15.5)
WBC: 8.5 10*3/uL (ref 4.0–10.5)

## 2014-05-30 MED ORDER — HYDROCODONE-ACETAMINOPHEN 7.5-325 MG/15ML PO SOLN: 10.0000 mL | ORAL | Status: DC | PRN

## 2014-05-30 NOTE — Discharge Instructions (Signed)
Nissen Fundoplication Care After Please read the instructions outlined below and refer to this sheet for the next few weeks. These discharge instructions provide you with general information on caring for yourself after you leave the hospital. Your doctor may also give you specific instructions. While your treatment has been planned according to the most current medical practices available, unavoidable complications sometimes happen. If you have any problems or questions after discharge, please call your doctor. ACTIVITY  Take frequent rest periods throughout the day.  Take frequent walks throughout the day. This will help to prevent blood clots.  Continue to do your coughing and deep breathing exercises once you get home. This will help to prevent pneumonia.  No strenuous activities such as heavy lifting, pushing or pulling until after your follow-up visit with your doctor. Do not lift anything heavier than 10 pounds.  Talk with your caregiver about when you may return to work and your exercise routine.  You may shower 2 days after surgery. Pat incisions dry. Do not rub incisions with washcloth or towel.  Do not drive while taking prescription pain medication. NUTRITION  Continue with a liquid diet, or the diet you were directed to take, until your first follow-up visit with your surgeon.  Drink fluids (6-8 glasses a day).  Call your caregiver for persistent nausea (feeling sick to your stomach), vomiting, bloating or difficulty swallowing. ELIMINATION It is very important not to strain during bowel movements. If constipation should occur, you may:  Take a mild laxative (such as Milk of Magnesia).  Add fruit and bran to your diet.  Drink more fluids.  Call your caregiver if constipation is not relieved. FEVER If you feel feverish or have shaking chills, take your temperature. If it is 23 F (38.9 C) or above, call your caregiver. The fever may mean there is an infection. PAIN  CONTROL  If a prescription was given for a pain reliever, please follow your caregiver's directions.  Only take over-the-counter or prescription medicines for pain, discomfort, or fever as directed by your caregiver.  If the pain is not relieved by your medicine, becomes worse, or you have difficulty breathing, call your doctor. INCISION  It is normal for your cuts (incisions) from surgery to have a small amount of drainage for the first 1-2 days. Once the drainage has stopped, leave your incision(s) open to air.  Check your incision(s) and surrounding area daily for any redness, swelling, increased drainage or bleeding. If any of these are present or if the wound edges start to separate, call your doctor.  If you have small adhesive strips in place, they will peel and fall off. (If these strips are covered with a clear bandage, your doctor will tell you when to remove them.)  If you have staples, your caregiver will remove them at the follow-up appointment. Document Released: 12/25/2003 Document Revised: 07/26/2011 Document Reviewed: 03/30/2007 [facility] Patient Information 2015 [place], [place]. This information is not intended to replace advice given to you by your health care provider. Make sure you discuss any questions you have with your health care provider.

## 2014-05-30 NOTE — Discharge Summary (Signed)
Physician Discharge Summary  Patient ID: [name] MRN: [id number] DOB/AGE: Sep 20, 1950 65 y.o.  Admit date: 05/27/2014 Discharge date: 05/30/2014  Admission Diagnoses:  Large type III hiatus hernia and right spigelian hernia  Discharge Diagnoses:  sam  Principal Problem:   Status post Nissen fundoplication and repair of large hiatal hernia Jan 2016   Surgery:  Repair of hiatal hernia and spigelian hernia  Discharged Condition: improved  Hospital Course:   Had surgery.  UGI on PD 1 looked OK.  Incisional hematoma in Spigelian site.  Hg dropped to 9.5 and stabilized.  Tolerating clear liquid diet and ready for discharge.   Consults: none  Significant Diagnostic Studies: UGI    Discharge Exam: Blood pressure 139/66, pulse 79, temperature 99.2 F (37.3 C), temperature source Oral, resp. rate 18, height 5\' 3"  (1.6 m), weight 170 lb (77.111 kg), last menstrual period 06/15/2004, SpO2 93 %. Incisions with some bruising otherwise OK  Disposition: 01-Home or Self Care  Discharge Instructions    Discharge instructions    Complete by:  As directed   Shower ad lib Full liquid diet for 1 week after surgery then pureed diet for 4 weeks     Increase activity slowly    Complete by:  As directed      No wound care    Complete by:  As directed             Medication List    STOP taking these medications        pantoprazole 20 MG tablet  Commonly known as:  PROTONIX      TAKE these medications        ALIGN 4 MG Caps  Take 1 capsule by mouth daily.     cholecalciferol 1000 UNITS tablet  Commonly known as:  VITAMIN D  Take 1,000 Units by mouth daily.     conjugated estrogens vaginal cream  Commonly known as:  PREMARIN  Use 1/2 g vaginally 2-3 times a week     docusate sodium 100 MG capsule  Commonly known as:  COLACE  Take 100 mg by mouth. Takes 2 tablets at night     estradiol 0.0375 MG/24HR  Commonly known as:  VIVELLE-DOT  Place 1 patch onto the skin 2  (two) times a week.     estradiol 0.0375 MG/24HR  Commonly known as:  MINIVELLE  PLACE 1 PATCH ONTO THE SKIN 2 TIMES WEEKLY ANYWHERE ON LOWE ABDOMEN,CHANGE ON SUN PM AND WED AM     fesoterodine 4 MG Tb24 tablet  Commonly known as:  TOVIAZ  Take 1 tablet (4 mg total) by mouth daily.     HYDROcodone-acetaminophen 7.5-325 mg/15 ml solution  Commonly known as:  HYCET  Take 10 mLs by mouth every 4 (four) hours as needed for moderate pain.     multivitamin tablet  Take 1 tablet by mouth daily.     polyethylene glycol packet  Commonly known as:  MIRALAX / GLYCOLAX  Take 17 g by mouth daily as needed for mild constipation.           Follow-up Information    Follow up with Pedro Earls, MD.   Specialty:  General Surgery   Contact information:   [place] [id number] [phone]       Signed: Pedro Earls 05/30/2014, 7:38 AM

## 2014-05-30 NOTE — Progress Notes (Signed)
Discharge teaching reviewed with pt and husband. Pt and husband verbalized understanding. IV dc'd. Pt incisions are clean and dry. Pt reports no pain. Pt discharged home with husband.

## 2014-06-03 ENCOUNTER — Other Ambulatory Visit: Payer: Self-pay | Admitting: Surgery

## 2014-06-03 ENCOUNTER — Observation Stay (HOSPITAL_COMMUNITY): Payer: 59

## 2014-06-03 ENCOUNTER — Other Ambulatory Visit (INDEPENDENT_AMBULATORY_CARE_PROVIDER_SITE_OTHER): Payer: Self-pay | Admitting: Surgery

## 2014-06-03 ENCOUNTER — Inpatient Hospital Stay (HOSPITAL_COMMUNITY)
Admission: AD | Admit: 2014-06-03 | Discharge: 2014-06-07 | DRG: 872 | Disposition: A | Discharge status: Home / Self Care | Payer: 59 | Source: Ambulatory Visit | Attending: Surgery | Admitting: Surgery

## 2014-06-03 ENCOUNTER — Encounter (HOSPITAL_COMMUNITY): Payer: Self-pay | Admitting: Surgery

## 2014-06-03 DIAGNOSIS — K219 Gastro-esophageal reflux disease without esophagitis: Secondary | ICD-10-CM | POA: Diagnosis present

## 2014-06-03 DIAGNOSIS — E860 Dehydration: Secondary | ICD-10-CM | POA: Diagnosis present

## 2014-06-03 DIAGNOSIS — Z882 Allergy status to sulfonamides status: Secondary | ICD-10-CM

## 2014-06-03 DIAGNOSIS — F40240 Claustrophobia: Secondary | ICD-10-CM | POA: Diagnosis present

## 2014-06-03 DIAGNOSIS — R748 Abnormal levels of other serum enzymes: Secondary | ICD-10-CM

## 2014-06-03 DIAGNOSIS — Z90710 Acquired absence of both cervix and uterus: Secondary | ICD-10-CM

## 2014-06-03 DIAGNOSIS — D62 Acute posthemorrhagic anemia: Secondary | ICD-10-CM | POA: Diagnosis present

## 2014-06-03 DIAGNOSIS — Z87891 Personal history of nicotine dependence: Secondary | ICD-10-CM

## 2014-06-03 DIAGNOSIS — R188 Other ascites: Secondary | ICD-10-CM

## 2014-06-03 DIAGNOSIS — Z09 Encounter for follow-up examination after completed treatment for conditions other than malignant neoplasm: Secondary | ICD-10-CM

## 2014-06-03 DIAGNOSIS — A419 Sepsis, unspecified organism: Secondary | ICD-10-CM | POA: Diagnosis present

## 2014-06-03 LAB — COMPREHENSIVE METABOLIC PANEL
ALBUMIN: 3.8 g/dL (ref 3.5–5.2)
ALK PHOS: 389 U/L — AB (ref 39–117)
ALT: 301 U/L — ABNORMAL HIGH (ref 0–35)
ALT: 311 U/L — ABNORMAL HIGH (ref 0–35)
AST: 234 U/L — ABNORMAL HIGH (ref 0–37)
AST: 253 U/L — ABNORMAL HIGH (ref 0–37)
Albumin: 4 g/dL (ref 3.5–5.2)
Alkaline Phosphatase: 376 U/L — ABNORMAL HIGH (ref 39–117)
Anion gap: 7 (ref 5–15)
BILIRUBIN TOTAL: 1.6 mg/dL — AB (ref 0.3–1.2)
BUN: 9 mg/dL (ref 6–23)
BUN: 9 mg/dL (ref 6–23)
CALCIUM: 9.3 mg/dL (ref 8.4–10.5)
CO2: 27 meq/L (ref 19–32)
CO2: 30 mmol/L (ref 19–32)
CREATININE: 0.59 mg/dL (ref 0.50–1.10)
CREATININE: 0.6 mg/dL (ref 0.50–1.10)
Calcium: 9 mg/dL (ref 8.4–10.5)
Chloride: 100 mEq/L (ref 96–112)
Chloride: 99 mEq/L (ref 96–112)
GFR calc non Af Amer: >90 mL/min (ref >90)
GLUCOSE: 107 mg/dL — AB (ref 70–99)
Glucose, Bld: 111 mg/dL — ABNORMAL HIGH (ref 70–99)
POTASSIUM: 5.1 meq/L (ref 3.5–5.3)
Potassium: 4.9 mmol/L (ref 3.5–5.1)
SODIUM: 136 mmol/L (ref 135–145)
Sodium: 137 mEq/L (ref 135–145)
TOTAL PROTEIN: 7.5 g/dL (ref 6.0–8.3)
Total Bilirubin: 2.3 mg/dL — ABNORMAL HIGH (ref 0.2–1.2)
Total Protein: 6.7 g/dL (ref 6.0–8.3)

## 2014-06-03 LAB — CBC WITH DIFFERENTIAL/PLATELET
BASOS ABS: 0 10*3/uL (ref 0.0–0.1)
Basophils Absolute: 0 10*3/uL (ref 0.0–0.1)
Basophils Relative: 0 % (ref 0–1)
Basophils Relative: 0 % (ref 0–1)
EOS ABS: 0.7 10*3/uL (ref 0.0–0.7)
EOS PCT: 4 % (ref 0–5)
Eosinophils Absolute: 0.7 10*3/uL (ref 0.0–0.7)
Eosinophils Relative: 4 % (ref 0–5)
HCT: 33.8 % — ABNORMAL LOW (ref 36.0–46.0)
HEMATOCRIT: 34.4 % — AB (ref 36.0–46.0)
HEMOGLOBIN: 11.3 g/dL — AB (ref 12.0–15.0)
Hemoglobin: 11.6 g/dL — ABNORMAL LOW (ref 12.0–15.0)
LYMPHS ABS: 2 10*3/uL (ref 0.7–4.0)
Lymphocytes Relative: 12 % (ref 12–46)
Lymphocytes Relative: 11 % — ABNORMAL LOW (ref 12–46)
Lymphs Abs: 2 10*3/uL (ref 0.7–4.0)
MCH: 32.5 pg (ref 26.0–34.0)
MCH: 32.2 pg (ref 26.0–34.0)
MCHC: 34.3 g/dL (ref 30.0–36.0)
MCHC: 32.8 g/dL (ref 30.0–36.0)
MCV: 94.7 fL (ref 78.0–100.0)
MCV: 98 fL (ref 78.0–100.0)
MONO ABS: 2 10*3/uL — AB (ref 0.1–1.0)
MONOS PCT: 12 % (ref 3–12)
MPV: 9.6 fL (ref 8.6–12.4)
Monocytes Absolute: 2 10*3/uL — ABNORMAL HIGH (ref 0.1–1.0)
Monocytes Relative: 11 % (ref 3–12)
NEUTROS ABS: 11.7 10*3/uL — AB (ref 1.7–7.7)
Neutro Abs: 13.1 10*3/uL — ABNORMAL HIGH (ref 1.7–7.7)
Neutrophils Relative %: 72 % (ref 43–77)
Neutrophils Relative %: 74 % (ref 43–77)
PLATELETS: 499 10*3/uL — AB (ref 150–400)
Platelets: 508 10*3/uL — ABNORMAL HIGH (ref 150–400)
RBC: 3.57 MIL/uL — ABNORMAL LOW (ref 3.87–5.11)
RBC: 3.51 MIL/uL — AB (ref 3.87–5.11)
RDW: 12.9 % (ref 11.5–15.5)
RDW: 12.9 % (ref 11.5–15.5)
WBC: 16.3 10*3/uL — AB (ref 4.0–10.5)
WBC: 17.8 10*3/uL — AB (ref 4.0–10.5)

## 2014-06-03 LAB — CP TYPE AND SCREEN: Antibody Screen: NEGATIVE

## 2014-06-03 LAB — ABO AND RH: Rh Type: POSITIVE

## 2014-06-03 MED ORDER — DEXTROSE IN LACTATED RINGERS 5 % IV SOLN
INTRAVENOUS | Status: DC
  Administered 2014-06-03 – 2014-06-07 (×6): via INTRAVENOUS

## 2014-06-03 MED ORDER — ONDANSETRON HCL 4 MG/2ML IJ SOLN: 4.0000 mg | Freq: Four times a day (QID) | INTRAMUSCULAR | Status: DC | PRN

## 2014-06-03 MED ORDER — IOHEXOL 300 MG/ML  SOLN
100.0000 mL | Freq: Once | INTRAMUSCULAR | Status: AC | PRN
  Administered 2014-06-03: 100 mL via INTRAVENOUS

## 2014-06-03 MED ORDER — DIPHENHYDRAMINE HCL 12.5 MG/5ML PO ELIX: 12.5000 mg | ORAL_SOLUTION | Freq: Four times a day (QID) | ORAL | Status: DC | PRN

## 2014-06-03 MED ORDER — PANTOPRAZOLE SODIUM 40 MG IV SOLR
40.0000 mg | Freq: Every day | INTRAVENOUS | Status: DC
  Administered 2014-06-03 – 2014-06-06 (×4): 40 mg via INTRAVENOUS
  Filled 2014-06-03 (×6): qty 40

## 2014-06-03 MED ORDER — IOHEXOL 300 MG/ML  SOLN
50.0000 mL | Freq: Once | INTRAMUSCULAR | Status: AC | PRN
  Administered 2014-06-03: 50 mL via ORAL

## 2014-06-03 MED ORDER — PIPERACILLIN-TAZOBACTAM 3.375 G IVPB
3.3750 g | Freq: Three times a day (TID) | INTRAVENOUS | Status: DC
  Administered 2014-06-04 – 2014-06-07 (×10): 3.375 g via INTRAVENOUS
  Filled 2014-06-03 (×11): qty 50

## 2014-06-03 MED ORDER — HYDROCODONE-ACETAMINOPHEN 5-325 MG PO TABS: 1.0000 | ORAL_TABLET | ORAL | Status: DC | PRN

## 2014-06-03 MED ORDER — PIPERACILLIN-TAZOBACTAM 3.375 G IVPB 30 MIN
3.3750 g | INTRAVENOUS | Status: AC
  Administered 2014-06-03: 3.375 g via INTRAVENOUS
  Filled 2014-06-03: qty 50

## 2014-06-03 MED ORDER — DIPHENHYDRAMINE HCL 50 MG/ML IJ SOLN: 12.5000 mg | Freq: Four times a day (QID) | INTRAMUSCULAR | Status: DC | PRN

## 2014-06-03 NOTE — H&P (Signed)
Chief Complaint:  Fever, weakness, and malaise after Nissen fundoplication and Spigelian hernia repair  History of Present Illness:  [name] is an 64 y.o. female is one week post lap Nissen and repair of right lower quadrant Spigelian hernia.  She was seen by me over the weekend for weakness because she had postop Hg drop to 9.5.  Her vitals on Saturday included a pulse of 100 and a BP of 130/70.  She has been taking liquids but has been feeling weak and feverish.  Lab was ordered this morning at [facility] and was sent STAT at 10 am and was faxed to our office at 3:30 but is not in the computer at this time.  Decision to admit was made without knowledge of the lab.  This lab showed a WBC of 16K and elevation of her liver function tests.  She is admitted for hydration and further evaluation.    Past Medical History  Diagnosis Date  . Urinary urgency   . GERD (gastroesophageal reflux disease)   . Bladder tumor     BENIGN  . Claustrophobia     Past Surgical History  Procedure Laterality Date  . Anterior cruciate ligament repair Right   . Bladder tumor excision  1992    benign  . Total abdominal hysterectomy w/ bilateral salpingoophorectomy  06/15/04    dermoid and AUB  . Abdominal hysterectomy    . Abdominal sacrocolpopexy N/A 11/06/2013    Procedure: ABDOMINO SACROCOLPOPEXY with Halban's culdoplasty;  Surgeon: Jamey Reas de Berton Lan, MD;  Location: [facility];  Service: Gynecology;  Laterality: N/A;  . Anterior and posterior repair N/A 11/06/2013    Procedure: ANTERIOR (CYSTOCELE) AND POSTERIOR REPAIR (RECTOCELE);  Surgeon: Jamey Reas de Berton Lan, MD;  Location: [facility];  Service: Gynecology;  Laterality: N/A;  . Bladder suspension N/A 11/06/2013    Procedure: TRANSVAGINAL TAPE (TVT) PROCEDURE exact midurethral sling;  Surgeon: Everardo All Amundson de Berton Lan, MD;  Location: [facility];  Service: Gynecology;  Laterality: N/A;  . Cystoscopy N/A 11/06/2013   Procedure: CYSTOSCOPY;  Surgeon: Jamey Reas de Berton Lan, MD;  Location: [facility];  Service: Gynecology;  Laterality: N/A;  . Hiatal hernia repair N/A 05/27/2014    Procedure: LAPAROSCOPIC REPAIR OF HIATAL HERNIA WITH MESH;  Surgeon: Pedro Earls, MD;  Location: [facility];  Service: General;  Laterality: N/A;  . Spigelian hernia Right 05/27/2014    Procedure: RIGHT SPIGELIAN HERNIA REPAIR WITH MESH;  Surgeon: Pedro Earls, MD;  Location: [facility];  Service: General;  Laterality: Right;  . Insertion of mesh  05/27/2014    Procedure: INSERTION OF MESH;  Surgeon: Pedro Earls, MD;  Location: [facility];  Service: General;;  two different mesh used for two different sites    Current Facility-Administered Medications  Medication Dose Route Frequency Provider Last Rate Last Dose  . dextrose 5 % in lactated ringers infusion   Intravenous Continuous Pedro Earls, MD      . diphenhydrAMINE (BENADRYL) injection 12.5-25 mg  12.5-25 mg Intravenous Q6H PRN Pedro Earls, MD       Or  . diphenhydrAMINE (BENADRYL) 12.5 MG/5ML elixir 12.5-25 mg  12.5-25 mg Oral Q6H PRN Pedro Earls, MD      . HYDROcodone-acetaminophen (NORCO/VICODIN) 5-325 MG per tablet 1-2 tablet  1-2 tablet Oral Q4H PRN Pedro Earls, MD      . ondansetron [facility]) injection 4 mg  4 mg  Intravenous Q6H PRN Pedro Earls, MD      . pantoprazole (PROTONIX) injection 40 mg  40 mg Intravenous QHS Pedro Earls, MD       Sulfa antibiotics Family History  Problem Relation Age of Onset  . Cancer Maternal Grandfather    Social History:   reports that she quit smoking about 41 years ago. Her smoking use included Cigarettes. She has never used smokeless tobacco. She reports that she drinks about 8.4 oz of alcohol per week. She reports that she does not use illicit drugs.   REVIEW OF SYSTEMS : Negative except for se hpi  Physical Exam:   Blood pressure 136/69, pulse 95, temperature 99.5 F (37.5 C), temperature  source Oral, resp. rate 18, last menstrual period 06/15/2004, SpO2 99 %. There is no weight on file to calculate BMI.  Gen:  WDWN WF NAD  Neurological: Alert and oriented to person, place, and time. Motor and sensory function is grossly intact  Head: Normocephalic and atraumatic.  Eyes: Conjunctivae are normal. Pupils are equal, round, and reactive to light. No scleral icterus.  Neck: Normal range of motion. Neck supple. No tracheal deviation or thyromegaly present.  Cardiovascular:  SR without murmurs or gallops.  No carotid bruits Breast:  Not examined. Respiratory: Effort normal.  No respiratory distress. No chest wall tenderness. Breath sounds normal.  No wheezes, rales or rhonchi.  Abdomen:  Incisions are nontender and covered with Liquiban.  There  Is no abdominal tenderness.  GU:  Not examined Musculoskeletal: Normal range of motion. Extremities are nontender. No cyanosis, edema or clubbing noted Lymphadenopathy: No cervical, preauricular, postauricular or axillary adenopathy is present Skin: Skin is warm and dry. No rash noted. No diaphoresis. No erythema. No pallor. Pscyh: Normal mood and affect. Behavior is normal. Judgment and thought content normal.   LABORATORY RESULTS: No results found for this or any previous visit (from the past 48 hour(s)).   RADIOLOGY RESULTS: No results found.  Problem List: Patient Active Problem List   Diagnosis Date Noted  . Anemia due to acute blood loss 06/03/2014  . Status post Nissen fundoplication and repair of large hiatal hernia Jan 2016 05/30/2014    Assessment & Plan: Elevated WBC and LFTs after major laparoscopic surgery.  Rule out source of sepsis.  CT abdomen and pelvis tonight.     Matt B. Hassell Done, MD, [facility] [phone] beeper [phone]  06/03/2014 4:40 PM

## 2014-06-03 NOTE — Progress Notes (Signed)
ANTIBIOTIC CONSULT NOTE - INITIAL  Pharmacy Consult for Zosyn Indication: Suspected intra-abdominal infection  Allergies  Allergen Reactions  . Sulfa Antibiotics Hives    Patient Measurements:   Adjusted Body Weight:   Vital Signs: Temp: 99.5 F (37.5 C) (01/18 1559) Temp Source: Oral (01/18 1559) BP: 136/69 mmHg (01/18 1559) Pulse Rate: 95 (01/18 1559) Intake/Output from previous day:   Intake/Output from this shift: Total I/O In: -  Out: 200 [Urine:200]  Labs: No results for input(s): WBC, HGB, PLT, LABCREA, CREATININE in the last 72 hours. Estimated Creatinine Clearance: 72.6 mL/min (by C-G formula based on Cr of 0.77). No results for input(s): VANCOTROUGH, VANCOPEAK, VANCORANDOM, GENTTROUGH, GENTPEAK, GENTRANDOM, TOBRATROUGH, TOBRAPEAK, TOBRARND, AMIKACINPEAK, AMIKACINTROU, AMIKACIN in the last 72 hours.   Microbiology: No results found for this or any previous visit (from the past 720 hour(s)).  Medical History: Past Medical History  Diagnosis Date  . Urinary urgency   . GERD (gastroesophageal reflux disease)   . Bladder tumor     BENIGN  . Claustrophobia     Assessment: 47 yoF s/p laparascopic nissen fundoplication and repair of RLQ hernia on 1/11, re-admitted 1/18 with ABLA and elevated WBCs and LFTs. CT A/P ordered.  Pharmacy consulted to start zosyn for suspected abdominal infection.    1/18 >> Zosyn  >>  Tmax: 99.5 WBCs: Elevated 17.8K Renal: SCr 0.60, CrCl 73 ml/min  1/18 CDiff: ordered  Goal of Therapy:  Eradication of infection  Plan:  Zosyn 3.375g IV q8h (infuse over 4 hours)  Ralene Bathe, PharmD, BCPS 06/03/2014, 8:23 PM  Pager: [phone]

## 2014-06-04 ENCOUNTER — Inpatient Hospital Stay (HOSPITAL_COMMUNITY): Payer: 59

## 2014-06-04 DIAGNOSIS — Z90710 Acquired absence of both cervix and uterus: Secondary | ICD-10-CM | POA: Diagnosis not present

## 2014-06-04 DIAGNOSIS — A419 Sepsis, unspecified organism: Secondary | ICD-10-CM | POA: Diagnosis present

## 2014-06-04 DIAGNOSIS — F40240 Claustrophobia: Secondary | ICD-10-CM | POA: Diagnosis present

## 2014-06-04 DIAGNOSIS — D62 Acute posthemorrhagic anemia: Secondary | ICD-10-CM | POA: Diagnosis present

## 2014-06-04 DIAGNOSIS — E860 Dehydration: Secondary | ICD-10-CM | POA: Diagnosis present

## 2014-06-04 DIAGNOSIS — K219 Gastro-esophageal reflux disease without esophagitis: Secondary | ICD-10-CM | POA: Diagnosis present

## 2014-06-04 DIAGNOSIS — Z882 Allergy status to sulfonamides status: Secondary | ICD-10-CM | POA: Diagnosis not present

## 2014-06-04 DIAGNOSIS — R531 Weakness: Secondary | ICD-10-CM | POA: Diagnosis present

## 2014-06-04 DIAGNOSIS — Z87891 Personal history of nicotine dependence: Secondary | ICD-10-CM | POA: Diagnosis not present

## 2014-06-04 LAB — CBC WITH DIFFERENTIAL/PLATELET
Basophils Absolute: 0 10*3/uL (ref 0.0–0.1)
Basophils Relative: 0 % (ref 0–1)
EOS ABS: 0.8 10*3/uL — AB (ref 0.0–0.7)
EOS PCT: 5 % (ref 0–5)
HCT: 29.4 % — ABNORMAL LOW (ref 36.0–46.0)
Hemoglobin: 9.8 g/dL — ABNORMAL LOW (ref 12.0–15.0)
Lymphocytes Relative: 12 % (ref 12–46)
Lymphs Abs: 1.8 10*3/uL (ref 0.7–4.0)
MCH: 32.2 pg (ref 26.0–34.0)
MCHC: 33.3 g/dL (ref 30.0–36.0)
MCV: 96.7 fL (ref 78.0–100.0)
Monocytes Absolute: 1.8 10*3/uL — ABNORMAL HIGH (ref 0.1–1.0)
Monocytes Relative: 12 % (ref 3–12)
NEUTROS ABS: 10.6 10*3/uL — AB (ref 1.7–7.7)
Neutrophils Relative %: 71 % (ref 43–77)
Platelets: 431 10*3/uL — ABNORMAL HIGH (ref 150–400)
RBC: 3.04 MIL/uL — AB (ref 3.87–5.11)
RDW: 12.9 % (ref 11.5–15.5)
WBC: 15 10*3/uL — AB (ref 4.0–10.5)

## 2014-06-04 LAB — COMPREHENSIVE METABOLIC PANEL
ALT: 213 U/L — AB (ref 0–35)
AST: 107 U/L — ABNORMAL HIGH (ref 0–37)
Albumin: 3.3 g/dL — ABNORMAL LOW (ref 3.5–5.2)
Alkaline Phosphatase: 297 U/L — ABNORMAL HIGH (ref 39–117)
Anion gap: 8 (ref 5–15)
BILIRUBIN TOTAL: 1.1 mg/dL (ref 0.3–1.2)
BUN: 6 mg/dL (ref 6–23)
CO2: 26 mmol/L (ref 19–32)
Calcium: 8.5 mg/dL (ref 8.4–10.5)
Chloride: 102 mEq/L (ref 96–112)
Creatinine, Ser: 0.62 mg/dL (ref 0.50–1.10)
GFR calc Af Amer: >90 mL/min (ref >90)
Glucose, Bld: 134 mg/dL — ABNORMAL HIGH (ref 70–99)
Potassium: 3.8 mmol/L (ref 3.5–5.1)
Sodium: 136 mmol/L (ref 135–145)
Total Protein: 6.1 g/dL (ref 6.0–8.3)

## 2014-06-04 LAB — CLOSTRIDIUM DIFFICILE BY PCR: CDIFFPCR: NEGATIVE

## 2014-06-04 MED ORDER — ACETAMINOPHEN 325 MG PO TABS
650.0000 mg | ORAL_TABLET | ORAL | Status: DC | PRN
  Administered 2014-06-04 – 2014-06-05 (×2): 650 mg via ORAL
  Filled 2014-06-04 (×2): qty 2

## 2014-06-04 NOTE — Progress Notes (Signed)
UR completed 

## 2014-06-05 LAB — CBC WITH DIFFERENTIAL/PLATELET
BASOS PCT: 0 % (ref 0–1)
Basophils Absolute: 0 10*3/uL (ref 0.0–0.1)
Eosinophils Absolute: 0.8 10*3/uL — ABNORMAL HIGH (ref 0.0–0.7)
Eosinophils Relative: 6 % — ABNORMAL HIGH (ref 0–5)
HCT: 29.3 % — ABNORMAL LOW (ref 36.0–46.0)
Hemoglobin: 9.6 g/dL — ABNORMAL LOW (ref 12.0–15.0)
LYMPHS ABS: 1.6 10*3/uL (ref 0.7–4.0)
Lymphocytes Relative: 13 % (ref 12–46)
MCH: 32.2 pg (ref 26.0–34.0)
MCHC: 32.8 g/dL (ref 30.0–36.0)
MCV: 98.3 fL (ref 78.0–100.0)
MONO ABS: 1.1 10*3/uL — AB (ref 0.1–1.0)
Monocytes Relative: 10 % (ref 3–12)
Neutro Abs: 8.3 10*3/uL — ABNORMAL HIGH (ref 1.7–7.7)
Neutrophils Relative %: 71 % (ref 43–77)
PLATELETS: 550 10*3/uL — AB (ref 150–400)
RBC: 2.98 MIL/uL — ABNORMAL LOW (ref 3.87–5.11)
RDW: 13.2 % (ref 11.5–15.5)
WBC: 11.8 10*3/uL — ABNORMAL HIGH (ref 4.0–10.5)

## 2014-06-05 LAB — COMPREHENSIVE METABOLIC PANEL
ALBUMIN: 3.2 g/dL — AB (ref 3.5–5.2)
ALT: 175 U/L — ABNORMAL HIGH (ref 0–35)
AST: 72 U/L — AB (ref 0–37)
Alkaline Phosphatase: 354 U/L — ABNORMAL HIGH (ref 39–117)
Anion gap: 9 (ref 5–15)
BUN: <5 mg/dL — ABNORMAL LOW (ref 6–23)
CHLORIDE: 103 meq/L (ref 96–112)
CO2: 25 mmol/L (ref 19–32)
Calcium: 8.5 mg/dL (ref 8.4–10.5)
Creatinine, Ser: 0.62 mg/dL (ref 0.50–1.10)
GFR calc Af Amer: >90 mL/min (ref >90)
GFR calc non Af Amer: >90 mL/min (ref >90)
Glucose, Bld: 123 mg/dL — ABNORMAL HIGH (ref 70–99)
Potassium: 3.6 mmol/L (ref 3.5–5.1)
SODIUM: 137 mmol/L (ref 135–145)
Total Bilirubin: 1.4 mg/dL — ABNORMAL HIGH (ref 0.3–1.2)
Total Protein: 6.3 g/dL (ref 6.0–8.3)

## 2014-06-06 ENCOUNTER — Ambulatory Visit (HOSPITAL_COMMUNITY): Payer: 59

## 2014-06-06 ENCOUNTER — Other Ambulatory Visit (INDEPENDENT_AMBULATORY_CARE_PROVIDER_SITE_OTHER): Payer: Self-pay | Admitting: Surgery

## 2014-06-06 DIAGNOSIS — R188 Other ascites: Secondary | ICD-10-CM

## 2014-06-06 LAB — CBC WITH DIFFERENTIAL/PLATELET
Basophils Absolute: 0.1 10*3/uL (ref 0.0–0.1)
Basophils Relative: 0 % (ref 0–1)
EOS ABS: 0.9 10*3/uL — AB (ref 0.0–0.7)
EOS PCT: 7 % — AB (ref 0–5)
HCT: 29.5 % — ABNORMAL LOW (ref 36.0–46.0)
Hemoglobin: 9.6 g/dL — ABNORMAL LOW (ref 12.0–15.0)
LYMPHS PCT: 15 % (ref 12–46)
Lymphs Abs: 1.8 10*3/uL (ref 0.7–4.0)
MCH: 31.7 pg (ref 26.0–34.0)
MCHC: 32.5 g/dL (ref 30.0–36.0)
MCV: 97.4 fL (ref 78.0–100.0)
Monocytes Absolute: 1.3 10*3/uL — ABNORMAL HIGH (ref 0.1–1.0)
Monocytes Relative: 10 % (ref 3–12)
NEUTROS ABS: 8.6 10*3/uL — AB (ref 1.7–7.7)
NEUTROS PCT: 68 % (ref 43–77)
Platelets: 556 10*3/uL — ABNORMAL HIGH (ref 150–400)
RBC: 3.03 MIL/uL — AB (ref 3.87–5.11)
RDW: 12.9 % (ref 11.5–15.5)
WBC: 12.7 10*3/uL — AB (ref 4.0–10.5)

## 2014-06-06 LAB — COMPREHENSIVE METABOLIC PANEL
ALBUMIN: 3 g/dL — AB (ref 3.5–5.2)
ALT: 138 U/L — AB (ref 0–35)
AST: 48 U/L — ABNORMAL HIGH (ref 0–37)
Alkaline Phosphatase: 349 U/L — ABNORMAL HIGH (ref 39–117)
Anion gap: 11 (ref 5–15)
CO2: 27 mmol/L (ref 19–32)
Calcium: 8.5 mg/dL (ref 8.4–10.5)
Chloride: 99 mEq/L (ref 96–112)
Creatinine, Ser: 0.74 mg/dL (ref 0.50–1.10)
GFR calc non Af Amer: 90 mL/min — ABNORMAL LOW (ref >90)
GLUCOSE: 118 mg/dL — AB (ref 70–99)
Potassium: 4 mmol/L (ref 3.5–5.1)
Sodium: 137 mmol/L (ref 135–145)
Total Bilirubin: 0.9 mg/dL (ref 0.3–1.2)
Total Protein: 6.3 g/dL (ref 6.0–8.3)

## 2014-06-06 MED ORDER — HYDROCODONE-ACETAMINOPHEN 5-325 MG PO TABS: 1.0000 | ORAL_TABLET | ORAL | Status: DC | PRN

## 2014-06-06 MED ORDER — IOHEXOL 300 MG/ML  SOLN
100.0000 mL | Freq: Once | INTRAMUSCULAR | Status: AC | PRN
  Administered 2014-06-06: 100 mL via INTRAVENOUS

## 2014-06-06 MED ORDER — AMOXICILLIN-POT CLAVULANATE 875-125 MG PO TABS: 1.0000 | ORAL_TABLET | Freq: Two times a day (BID) | ORAL | Status: AC

## 2014-06-06 NOTE — Progress Notes (Addendum)
Patient ID: [name], female   DOB: [birth year], 63 y.o.   MRN: [id number] [facility] Surgery Progress Note:   * No surgery found *  Subjective: Mental status is clear.  Feeling better Objective: Vital signs in last 24 hours: Temp:  [98.7 F (37.1 C)-99.5 F (37.5 C)] 98.9 F (37.2 C) (01/21 0511) Pulse Rate:  [77-83] 77 (01/21 0511) Resp:  [16-18] 16 (01/21 0511) BP: (116-139)/(56-64) 116/56 mmHg (01/21 0511) SpO2:  [95 %-98 %] 95 % (01/21 0511)  Intake/Output from previous day: 01/20 0701 - 01/21 0700 In: 3060 [P.O.:360; I.V.:2500; IV Piggyback:200] Out: 2450 [Urine:2450] Intake/Output this shift:    Physical Exam: Work of breathing is normal.  Abdomen is nontender  Lab Results:  Results for orders placed or performed during the hospital encounter of 06/03/14 (from the past 48 hour(s))  Culture, blood (routine x 2)     Status: None (Preliminary result)   Collection Time: 06/04/14  7:12 PM  Result Value Ref Range   Specimen Description BLOOD RIGHT HAND    Special Requests BOTTLES DRAWN AEROBIC ONLY  3CC    Culture             BLOOD CULTURE RECEIVED NO GROWTH TO DATE CULTURE WILL BE HELD FOR 5 DAYS BEFORE ISSUING A FINAL NEGATIVE REPORT Performed at [facility]    Report Status PENDING   Culture, blood (routine x 2)     Status: None (Preliminary result)   Collection Time: 06/04/14  7:16 PM  Result Value Ref Range   Specimen Description BLOOD LEFT HAND    Special Requests BOTTLES DRAWN AEROBIC ONLY  3CC    Culture             BLOOD CULTURE RECEIVED NO GROWTH TO DATE CULTURE WILL BE HELD FOR 5 DAYS BEFORE ISSUING A FINAL NEGATIVE REPORT Performed at [facility]    Report Status PENDING   CBC with Differential     Status: Abnormal   Collection Time: 06/05/14  7:55 AM  Result Value Ref Range   WBC 11.8 (H) 4.0 - 10.5 K/uL   RBC 2.98 (L) 3.87 - 5.11 MIL/uL   Hemoglobin 9.6 (L) 12.0 - 15.0 g/dL   HCT 06.0 (L) 67.1 - 51.9 %   MCV 98.3 78.0 -  100.0 fL   MCH 32.2 26.0 - 34.0 pg   MCHC 32.8 30.0 - 36.0 g/dL   RDW 51.1 13.5 - 65.2 %   Platelets 550 (H) 150 - 400 K/uL   Neutrophils Relative % 71 43 - 77 %   Neutro Abs 8.3 (H) 1.7 - 7.7 K/uL   Lymphocytes Relative 13 12 - 46 %   Lymphs Abs 1.6 0.7 - 4.0 K/uL   Monocytes Relative 10 3 - 12 %   Monocytes Absolute 1.1 (H) 0.1 - 1.0 K/uL   Eosinophils Relative 6 (H) 0 - 5 %   Eosinophils Absolute 0.8 (H) 0.0 - 0.7 K/uL   Basophils Relative 0 0 - 1 %   Basophils Absolute 0.0 0.0 - 0.1 K/uL  Comprehensive metabolic panel     Status: Abnormal   Collection Time: 06/05/14  7:55 AM  Result Value Ref Range   Sodium 137 135 - 145 mmol/L    Comment: Please note change in reference range.   Potassium 3.6 3.5 - 5.1 mmol/L    Comment: Please note change in reference range.   Chloride 103 96 - 112 mEq/L   CO2 25 19 - 32  mmol/L   Glucose, Bld 123 (H) 70 - 99 mg/dL   BUN <5 (L) 6 - 23 mg/dL   Creatinine, Ser 0.62 0.50 - 1.10 mg/dL   Calcium 8.5 8.4 - 10.5 mg/dL   Total Protein 6.3 6.0 - 8.3 g/dL   Albumin 3.2 (L) 3.5 - 5.2 g/dL   AST 72 (H) 0 - 37 U/L   ALT 175 (H) 0 - 35 U/L   Alkaline Phosphatase 354 (H) 39 - 117 U/L   Total Bilirubin 1.4 (H) 0.3 - 1.2 mg/dL   GFR calc non Af Amer >90 >90 mL/min   GFR calc Af Amer >90 >90 mL/min    Comment: (NOTE) The eGFR has been calculated using the CKD EPI equation. This calculation has not been validated in all clinical situations. eGFR's persistently <90 mL/min signify possible Chronic Kidney Disease.    Anion gap 9 5 - 15  CBC with Differential     Status: Abnormal   Collection Time: 06/06/14  6:58 AM  Result Value Ref Range   WBC 12.7 (H) 4.0 - 10.5 K/uL   RBC 3.03 (L) 3.87 - 5.11 MIL/uL   Hemoglobin 9.6 (L) 12.0 - 15.0 g/dL   HCT 29.5 (L) 36.0 - 46.0 %   MCV 97.4 78.0 - 100.0 fL   MCH 31.7 26.0 - 34.0 pg   MCHC 32.5 30.0 - 36.0 g/dL   RDW 12.9 11.5 - 15.5 %   Platelets 556 (H) 150 - 400 K/uL   Neutrophils Relative % 68 43 - 77 %    Neutro Abs 8.6 (H) 1.7 - 7.7 K/uL   Lymphocytes Relative 15 12 - 46 %   Lymphs Abs 1.8 0.7 - 4.0 K/uL   Monocytes Relative 10 3 - 12 %   Monocytes Absolute 1.3 (H) 0.1 - 1.0 K/uL   Eosinophils Relative 7 (H) 0 - 5 %   Eosinophils Absolute 0.9 (H) 0.0 - 0.7 K/uL   Basophils Relative 0 0 - 1 %   Basophils Absolute 0.1 0.0 - 0.1 K/uL  Comprehensive metabolic panel     Status: Abnormal   Collection Time: 06/06/14  6:58 AM  Result Value Ref Range   Sodium 137 135 - 145 mmol/L    Comment: Please note change in reference range.   Potassium 4.0 3.5 - 5.1 mmol/L    Comment: Please note change in reference range.   Chloride 99 96 - 112 mEq/L   CO2 27 19 - 32 mmol/L   Glucose, Bld 118 (H) 70 - 99 mg/dL   BUN <5 (L) 6 - 23 mg/dL   Creatinine, Ser 0.74 0.50 - 1.10 mg/dL   Calcium 8.5 8.4 - 10.5 mg/dL   Total Protein 6.3 6.0 - 8.3 g/dL   Albumin 3.0 (L) 3.5 - 5.2 g/dL   AST 48 (H) 0 - 37 U/L   ALT 138 (H) 0 - 35 U/L   Alkaline Phosphatase 349 (H) 39 - 117 U/L   Total Bilirubin 0.9 0.3 - 1.2 mg/dL   GFR calc non Af Amer 90 (L) >90 mL/min   GFR calc Af Amer >90 >90 mL/min    Comment: (NOTE) The eGFR has been calculated using the CKD EPI equation. This calculation has not been validated in all clinical situations. eGFR's persistently <90 mL/min signify possible Chronic Kidney Disease.    Anion gap 11 5 - 15    Radiology/Results: US Abdomen Complete  06/04/2014   CLINICAL DATA:  Elevated liver function tests.  EXAM:  ULTRASOUND ABDOMEN COMPLETE  COMPARISON:  CT 06/03/2014  FINDINGS: Gallbladder: No gallstones or wall thickening visualized. No sonographic Murphy sign noted.  Common bile duct: Diameter: 4 mm  Liver: No focal lesion identified. Within normal limits in parenchymal echogenicity. Small perihepatic fluid collection seen along the posterior margin of the central left hepatic lobe which measures 2.7 x 2.3 cm, as seen on recent CT.  IVC: No abnormality visualized.  Pancreas:  Visualized portion unremarkable.  Spleen: Size and appearance within normal limits.  Right Kidney: Length: 9.7 cm. Echogenicity within normal limits. No mass or hydronephrosis visualized.  Left Kidney: Length: 11.1 cm. Echogenicity within normal limits. No mass or hydronephrosis visualized.  Abdominal aorta: No aneurysm visualized.  Other findings: None.  IMPRESSION: Small perihepatic fluid collection adjacent to the left hepatic lobe, as seen on recent CT. Otherwise unremarkable sonographic appearance of the liver.  No evidence of gallstones or biliary dilatation.   Electronically Signed   By: Earle Gell M.D.   On: 06/04/2014 19:57    Anti-infectives: Anti-infectives    Start     Dose/Rate Route Frequency Ordered Stop   06/04/14 0200  piperacillin-tazobactam (ZOSYN) IVPB 3.375 g     3.375 g12.5 mL/hr over 240 Minutes Intravenous Every 8 hours 06/03/14 2024     06/03/14 1715  piperacillin-tazobactam (ZOSYN) IVPB 3.375 g     3.375 g100 mL/hr over 30 Minutes Intravenous NOW 06/03/14 1704 06/03/14 1829      Assessment/Plan: Problem List: Patient Active Problem List   Diagnosis Date Noted  . Sepsis 06/04/2014  . Anemia due to acute blood loss 06/03/2014  . Status post Nissen fundoplication and repair of large hiatal hernia Jan 2016 05/30/2014    WBC plateaued at 12K.  Eosinophilia noted.  No fever spike yesterday.  Will assess subphrenic collection by CT today.   * No surgery found *    LOS: 3 days   Matt B. Hassell Done, MD, [facility] [phone] beeper [phone]  06/06/2014 8:41 AM CT scan reviewed with Dr. Vernard Gambles.   Improved.  Will continue IV abx and observation.  May transition to oral antibiotics tomorrow.

## 2014-06-06 NOTE — Progress Notes (Signed)
ANTIBIOTIC CONSULT NOTE - INITIAL  Pharmacy Consult for Zosyn Indication: Suspected intra-abdominal infection  Allergies  Allergen Reactions  . Sulfa Antibiotics Hives    Patient Measurements: Height: 5\' 3"  (160 cm) Weight: 170 lb (77.111 kg) IBW/kg (Calculated) : 52.4 Adjusted Body Weight:   Vital Signs: Temp: 97.8 F (36.6 C) (01/21 1355) Temp Source: Oral (01/21 1355) BP: 136/69 mmHg (01/21 1355) Pulse Rate: 74 (01/21 1355) Intake/Output from previous day: 01/20 0701 - 01/21 0700 In: 3060 [P.O.:360; I.V.:2500; IV Piggyback:200] Out: 2450 [Urine:2450] Intake/Output from this shift: Total I/O In: 1480 [P.O.:480; I.V.:1000] Out: 1200 [Urine:1200]  Labs:  Recent Labs  06/04/14 0520 06/05/14 0755 06/06/14 0658  WBC 15.0* 11.8* 12.7*  HGB 9.8* 9.6* 9.6*  PLT 431* 550* 556*  CREATININE 0.62 0.62 0.74   Estimated Creatinine Clearance: 72.6 mL/min (by C-G formula based on Cr of 0.74). No results for input(s): VANCOTROUGH, VANCOPEAK, VANCORANDOM, GENTTROUGH, GENTPEAK, GENTRANDOM, TOBRATROUGH, TOBRAPEAK, TOBRARND, AMIKACINPEAK, AMIKACINTROU, AMIKACIN in the last 72 hours.   Microbiology: Recent Results (from the past 720 hour(s))  Clostridium Difficile by PCR     Status: None   Collection Time: 06/04/14  7:40 AM  Result Value Ref Range Status   C difficile by pcr NEGATIVE NEGATIVE Final    Comment: Performed at [facility]  Culture, blood (routine x 2)     Status: None (Preliminary result)   Collection Time: 06/04/14  7:12 PM  Result Value Ref Range Status   Specimen Description BLOOD RIGHT HAND  Final   Special Requests BOTTLES DRAWN AEROBIC ONLY  3CC  Final   Culture   Final           BLOOD CULTURE RECEIVED NO GROWTH TO DATE CULTURE WILL BE HELD FOR 5 DAYS BEFORE ISSUING A FINAL NEGATIVE REPORT Performed at [facility]    Report Status PENDING  Incomplete  Culture, blood (routine x 2)     Status: None (Preliminary result)   Collection Time:  06/04/14  7:16 PM  Result Value Ref Range Status   Specimen Description BLOOD LEFT HAND  Final   Special Requests BOTTLES DRAWN AEROBIC ONLY  3CC  Final   Culture   Final           BLOOD CULTURE RECEIVED NO GROWTH TO DATE CULTURE WILL BE HELD FOR 5 DAYS BEFORE ISSUING A FINAL NEGATIVE REPORT Performed at [facility]    Report Status PENDING  Incomplete    Medical History: Past Medical History  Diagnosis Date  . Urinary urgency   . GERD (gastroesophageal reflux disease)   . Bladder tumor     BENIGN  . Claustrophobia     Assessment: 38 yoF s/p laparascopic nissen fundoplication and repair of RLQ hernia on 1/11, re-admitted 1/18 with ABLA and elevated WBCs and LFTs. CT A/P ordered. Pharmacy consulted to start zosyn for suspected abdominal infection.  1/18 >> Zosyn >>  Tmax: 99.5, had 102.5 spike on 1/19 PM WBCs: moderately elevated on admission, now trending down though still above ULN Renal: SCr 0.74, stable; CrCl 73 ml/min using SCr = 0.8  1/18 CDiff: neg 1/19: blood (on abx): NGTD   Goal of Therapy:  Eradication of infection  Plan:  Continue Zosyn 3.375g IV q8h (infuse over 4 hours) F/u renal function, clinical course, cultures as available  Reuel Boom, PharmD Pager: [phone] 06/06/2014, 2:15 PM

## 2014-06-07 LAB — COMPREHENSIVE METABOLIC PANEL
ALBUMIN: 3.1 g/dL — AB (ref 3.5–5.2)
ALT: 111 U/L — ABNORMAL HIGH (ref 0–35)
ANION GAP: 8 (ref 5–15)
AST: 33 U/L (ref 0–37)
Alkaline Phosphatase: 365 U/L — ABNORMAL HIGH (ref 39–117)
BUN: <5 mg/dL — ABNORMAL LOW (ref 6–23)
CALCIUM: 8.6 mg/dL (ref 8.4–10.5)
CO2: 29 mmol/L (ref 19–32)
CREATININE: 0.73 mg/dL (ref 0.50–1.10)
Chloride: 103 mEq/L (ref 96–112)
Glucose, Bld: 102 mg/dL — ABNORMAL HIGH (ref 70–99)
POTASSIUM: 4.5 mmol/L (ref 3.5–5.1)
Sodium: 140 mmol/L (ref 135–145)
Total Bilirubin: 0.8 mg/dL (ref 0.3–1.2)
Total Protein: 6.6 g/dL (ref 6.0–8.3)

## 2014-06-07 LAB — CBC WITH DIFFERENTIAL/PLATELET
Basophils Absolute: 0 10*3/uL (ref 0.0–0.1)
Basophils Relative: 0 % (ref 0–1)
EOS ABS: 0.9 10*3/uL — AB (ref 0.0–0.7)
EOS PCT: 9 % — AB (ref 0–5)
HCT: 30.5 % — ABNORMAL LOW (ref 36.0–46.0)
Hemoglobin: 9.9 g/dL — ABNORMAL LOW (ref 12.0–15.0)
LYMPHS PCT: 19 % (ref 12–46)
Lymphs Abs: 2 10*3/uL (ref 0.7–4.0)
MCH: 31.6 pg (ref 26.0–34.0)
MCHC: 32.5 g/dL (ref 30.0–36.0)
MCV: 97.4 fL (ref 78.0–100.0)
MONO ABS: 0.9 10*3/uL (ref 0.1–1.0)
Monocytes Relative: 9 % (ref 3–12)
NEUTROS PCT: 63 % (ref 43–77)
Neutro Abs: 6.7 10*3/uL (ref 1.7–7.7)
Platelets: 625 10*3/uL — ABNORMAL HIGH (ref 150–400)
RBC: 3.13 MIL/uL — ABNORMAL LOW (ref 3.87–5.11)
RDW: 13 % (ref 11.5–15.5)
WBC: 10.5 10*3/uL (ref 4.0–10.5)

## 2014-06-07 MED ORDER — AMOXICILLIN-POT CLAVULANATE 875-125 MG PO TABS
1.0000 | ORAL_TABLET | Freq: Two times a day (BID) | ORAL | Status: DC
  Filled 2014-06-07 (×2): qty 1

## 2014-06-07 NOTE — Discharge Summary (Signed)
Physician Discharge Summary  Patient ID: CASSIDEY BARRALES MRN: [id number] DOB/AGE: 08/15/1950 64 y.o.  Admit date: 06/03/2014 Discharge date: 06/07/2014  Admission Diagnoses:  Nausea dehydration and sepsis  Discharge Diagnoses:  same  Active Problems:   Anemia due to acute blood loss   Surgery:  none  Discharged Condition: improved  Hospital Course:   Admitted and rehydrated and begun on Zosyn.  WBC initially elevated and normalized.  CTscan showed some stranding up near postop hemorrhage at site of Nissen wrap and this got better with observation.  Converted to oral antibiotics and ready for discharge  Consults: none  Significant Diagnostic Studies: CT scans x 2    Discharge Exam: Blood pressure 110/64, pulse 70, temperature 98.9 F (37.2 C), temperature source Oral, resp. rate 16, height 5\' 3"  (1.6 m), weight 170 lb (77.111 kg), last menstrual period 06/15/2004, SpO2 98 %. No pain,  Incisions ok  Disposition: 01-Home or Self Care  Discharge Instructions    Diet - low sodium heart healthy    Complete by:  As directed      Discharge instructions    Complete by:  As directed   Soft diet Take Augmentin 1 every 12 hours as prescribed.     Increase activity slowly    Complete by:  As directed      No wound care    Complete by:  As directed             Medication List    TAKE these medications        acetaminophen 500 MG tablet  Commonly known as:  TYLENOL  Take 1,000 mg by mouth every 6 (six) hours as needed for fever (fever).     ALIGN 4 MG Caps  Take 1 capsule by mouth daily.     amoxicillin-clavulanate 875-125 MG per tablet  Commonly known as:  AUGMENTIN  Take 1 tablet by mouth 2 (two) times daily.     cholecalciferol 1000 UNITS tablet  Commonly known as:  VITAMIN D  Take 1,000 Units by mouth daily.     conjugated estrogens vaginal cream  Commonly known as:  PREMARIN  Use 1/2 g vaginally 2-3 times a week     docusate sodium 100 MG capsule   Commonly known as:  COLACE  Take 200 mg by mouth at bedtime. Takes 2 tablets at night     estradiol 0.0375 MG/24HR  Commonly known as:  MINIVELLE  PLACE 1 PATCH ONTO THE SKIN 2 TIMES WEEKLY ANYWHERE ON LOWE ABDOMEN,CHANGE ON SUN PM AND WED AM     fesoterodine 4 MG Tb24 tablet  Commonly known as:  TOVIAZ  Take 1 tablet (4 mg total) by mouth daily.     HYDROcodone-acetaminophen 7.5-325 mg/15 ml solution  Commonly known as:  HYCET  Take 10 mLs by mouth every 4 (four) hours as needed for moderate pain.     multivitamin tablet  Take 1 tablet by mouth daily.     polyethylene glycol packet  Commonly known as:  MIRALAX / GLYCOLAX  Take 17 g by mouth daily as needed for mild constipation (constipation).           Follow-up Information    Follow up with Pedro Earls, MD.   Specialty:  General Surgery   Contact information:   [address] [place] [address] [phone]       Signed: Pedro Earls 06/07/2014, 7:51 AM

## 2014-06-07 NOTE — Progress Notes (Signed)
Patient is alert and oriented, vital signs are stable and patient is afebrile, discharge instructions reviewed with patient and questions and concerns answered Neta Mends RN 06-07-2014 8:46am

## 2014-06-11 LAB — CULTURE, BLOOD (ROUTINE X 2)
CULTURE: NO GROWTH
CULTURE: NO GROWTH

## 2014-06-24 ENCOUNTER — Other Ambulatory Visit: Payer: Self-pay | Admitting: Nurse Practitioner

## 2014-06-29 ENCOUNTER — Other Ambulatory Visit: Payer: Self-pay | Admitting: Nurse Practitioner

## 2014-07-01 ENCOUNTER — Other Ambulatory Visit: Payer: Self-pay | Admitting: Nurse Practitioner

## 2014-07-01 NOTE — Telephone Encounter (Signed)
Medication refill request: Minivelle patch Last AEX:  03/07/14 Next AEX: 03/13/14 Last MMG (if hormonal medication request): 02/11/14 BIRADS1:neg Refill authorized: 03/07/14 #24/3R to [facility]

## 2014-07-23 ENCOUNTER — Other Ambulatory Visit: Payer: Self-pay | Admitting: Nurse Practitioner

## 2014-07-23 NOTE — Telephone Encounter (Signed)
Medication refill request: Minivelle patch Last AEX:  03/07/14 PG Next AEX: 03/14/15 PG Last MMG (if hormonal medication request): 02/11/14 BIRADS1:neg Refill authorized: 03/07/14 #24/3R To [facility]

## 2014-08-05 ENCOUNTER — Encounter: Payer: Self-pay | Admitting: Nurse Practitioner

## 2014-08-05 ENCOUNTER — Ambulatory Visit (INDEPENDENT_AMBULATORY_CARE_PROVIDER_SITE_OTHER): Payer: 59 | Admitting: Nurse Practitioner

## 2014-08-05 ENCOUNTER — Telehealth: Payer: Self-pay | Admitting: Nurse Practitioner

## 2014-08-05 VITALS — BP 142/86 | HR 80 | Temp 98.7°F | Resp 16 | Ht 63.0 in | Wt 161.0 lb

## 2014-08-05 DIAGNOSIS — R319 Hematuria, unspecified: Secondary | ICD-10-CM

## 2014-08-05 LAB — POCT URINALYSIS DIPSTICK
Bilirubin, UA: NEGATIVE
Glucose, UA: NEGATIVE
Ketones, UA: NEGATIVE
Nitrite, UA: NEGATIVE
Protein, UA: NEGATIVE
Urobilinogen, UA: NEGATIVE
pH, UA: 5

## 2014-08-05 LAB — CBC WITH DIFFERENTIAL/PLATELET
BASOS ABS: 0 10*3/uL (ref 0.0–0.1)
EOS ABS: 0 10*3/uL (ref 0.0–0.7)
HCT: 47 % — ABNORMAL HIGH (ref 36.0–46.0)
Hemoglobin: 15.1 g/dL — ABNORMAL HIGH (ref 12.0–15.0)
LYMPHS PCT: 23 % (ref 12–46)
Lymphs Abs: 2.6 10*3/uL (ref 0.7–4.0)
MCH: 30 pg (ref 26.0–34.0)
MCHC: 32 g/dL (ref 30.0–36.0)
MCV: 93.5 fL (ref 78.0–100.0)
Monocytes Absolute: 0.5 10*3/uL (ref 0.1–1.0)
Monocytes Relative: 4 % (ref 3–12)
Neutro Abs: 8.2 10*3/uL — ABNORMAL HIGH (ref 1.7–7.7)
Neutrophils Relative %: 73 % (ref 43–77)
PLATELETS: 354 10*3/uL (ref 150–400)
RBC: 5.03 MIL/uL (ref 3.87–5.11)
RDW: 13.5 % (ref 11.5–15.5)
WBC: 11.3 10*3/uL — ABNORMAL HIGH (ref 4.0–10.5)

## 2014-08-05 LAB — URINALYSIS, MICROSCOPIC ONLY
Casts: NONE SEEN
Crystals: NONE SEEN

## 2014-08-05 MED ORDER — CIPROFLOXACIN HCL 500 MG PO TABS: 500.0000 mg | ORAL_TABLET | Freq: Two times a day (BID) | ORAL | Status: DC

## 2014-08-05 NOTE — Progress Notes (Signed)
S:  64 y.o.Married Caucasian female presents with complaint of UTI. Symptoms began on  Saturday pm. With symptoms of blood in urine, dysuria, urinary frequency, urinary urgency, some nausea and diarrhea yesterday am.    The patient is having no constitutional symptoms, denying fever, chills, or weight loss.. Sexually active yes  Symptoms not related to post coital.  Menopausal yes Vaginal dryness yes.  Same partner without change. Last UTI documented a year ago.  She was in the hospital in January for 8 days with hiatal hernia repair with Nissen fundoplication   During that time had elevated WBC at 17.8 on 06/03/14 with back to normal after IV antibiotics.  Took Tylenol today at 3:30 am.  ROS: feels ill and loss of appetite  O alert, oriented to person, place, and time, normal mood, behavior, speech, dress, motor activity, and thought processes   healthy,  alert and  not in acute distress  Abdomen: mild lower abdominal pressure  No CVA tenderness  Pelvic:  external genitalia normal, no adnexal masses or tenderness and vagina normal without discharge.  Slight tender at the urethra   Diagnostic Test:    Urinalysis:  Small RBC and trace Leuk's   {PROCEDURES urine culture and micro, stat CBC  Assessment: Hematuria    R/O UTI   S/P abdominal sacropexy with mid urethral sling 11/12/13  Plan:   Maintain adequate hydration. Follow up if symptoms not improving, and as needed.   Medication Therapy: Cipro 500 mg BID # 14   Lab:TOC if Urine Culture is positive  STAT CBC and will follow   RV

## 2014-08-05 NOTE — Patient Instructions (Signed)

## 2014-08-05 NOTE — Telephone Encounter (Signed)
Patient has cystitis with blood and wants to come in to see PG.

## 2014-08-05 NOTE — Telephone Encounter (Signed)
The patient is notified that WBC is slightly elevated at 11.5.  She will start on Cipro and will be checking back with her after consult with Dr. Quincy Simmonds.  She may need injection of Rocephin if not better.

## 2014-08-05 NOTE — Telephone Encounter (Signed)
Spoke with patient. She is scheduled for today at 1245 with Milford Cage, FNP due to two days of dysuria, hematuria and flank pain. No fevers, no chills, no nausea or vomiting. No active bleeding.  Routing to provider for final review. Patient agreeable to disposition. Will close encounter

## 2014-08-06 ENCOUNTER — Telehealth: Payer: Self-pay

## 2014-08-06 NOTE — Telephone Encounter (Signed)
Spoke with patient. Advised that I am calling to check on her after her appointment yesterday with Milford Cage, FNP. Patient states "I am feeling better from yesterday after taking the antibiotics. I meant to ask her if I can continue using the Premarin while I have the UTI." Advised patient may continue with Premarin cream while taking Cipro as this will not interfere with medication. Patient is agreeable. Advised to return call if symptoms return or worsen. Patient is agreeable.  Routing to provider for final review. Patient agreeable to disposition. Will close encounter

## 2014-08-07 LAB — URINE CULTURE

## 2014-08-07 NOTE — Telephone Encounter (Signed)
Did we make apt for TOC?  If not needs to be done 1 week after med's.  OV not just nurse visit.

## 2014-08-07 NOTE — Telephone Encounter (Signed)
Spoke with patient. Follow up appointment scheduled for 4/5 at 2:15pm with [name], [place]. Patient is agreeable to date and time.  Routing to provider for final review. Patient agreeable to disposition. Will close encounter

## 2014-08-11 NOTE — Progress Notes (Signed)
Encounter reviewed by Dr. Randell Teare Silva.  

## 2014-08-20 ENCOUNTER — Encounter: Payer: Self-pay | Admitting: Nurse Practitioner

## 2014-08-20 ENCOUNTER — Ambulatory Visit (INDEPENDENT_AMBULATORY_CARE_PROVIDER_SITE_OTHER): Payer: 59 | Admitting: Nurse Practitioner

## 2014-08-20 VITALS — BP 120/74 | HR 64 | Ht 63.0 in | Wt 164.0 lb

## 2014-08-20 DIAGNOSIS — R319 Hematuria, unspecified: Secondary | ICD-10-CM

## 2014-08-20 DIAGNOSIS — N390 Urinary tract infection, site not specified: Secondary | ICD-10-CM | POA: Diagnosis not present

## 2014-08-20 LAB — POCT URINALYSIS DIPSTICK
Bilirubin, UA: NEGATIVE
Blood, UA: NEGATIVE
Glucose, UA: NEGATIVE
Ketones, UA: NEGATIVE
LEUKOCYTES UA: NEGATIVE
NITRITE UA: NEGATIVE
PH UA: 5
Protein, UA: NEGATIVE
Urobilinogen, UA: NEGATIVE

## 2014-08-20 NOTE — Progress Notes (Signed)
64 y.o. WM Fe [id number] here for follow up of E Coli UTI treated with Cipro initiated on 08/05/14.  Completed all medication as directed.  Denies any symptoms of dysuria, back pain, fever or chills.  She has felt better with no signs infection or blood in the urine.  Denies vaginal symptoms.    O: Healthy WD,WN female Affect: no distress, alert Skin: warm and dry Heart: NSR Abdomen:soft, non tender, normal bowel sounds, no flank pain Pelvic exam: not indicated Urine chemstrip negative   A: E Coli UTI Resolved with elevated WBC - no indication to repeat at this time.    P:  Discussed findings of elevated WBC on last visit and treatment rationale  UTI / pyelo precautions  Labs :  Urine C&S and micro  Instructions given regarding: worsening of symptoms  Increase in back pain  Fever or chills  Plans are to call her with results.  RV

## 2014-08-21 LAB — URINALYSIS, MICROSCOPIC ONLY
Bacteria, UA: NONE SEEN
Casts: NONE SEEN
Crystals: NONE SEEN
SQUAMOUS EPITHELIAL / LPF: NONE SEEN

## 2014-08-21 LAB — URINE CULTURE: Colony Count: 40000

## 2014-08-25 NOTE — Progress Notes (Signed)
Encounter reviewed by Dr. Jordan Caraveo Silva.  

## 2014-11-12 ENCOUNTER — Other Ambulatory Visit: Payer: Self-pay | Admitting: Nurse Practitioner

## 2014-11-12 NOTE — Telephone Encounter (Signed)
Medication refill request: premarin  Last AEX:  03/07/14 PG Next AEX: 03/14/15 PG Last MMG (if hormonal medication request): 02/12/14 BIRADS1:neg Refill authorized: 03/07/14 #42.5g/3R. Today #30g/0R?

## 2015-01-07 IMAGING — RF DG UGI W/ HIGH DENSITY W/KUB
11 series · 11 of 11 positions shown · non-contrast
Comparison: CT scan dated 03/12/2014 and 12/25/2005

CLINICAL DATA: Persistent cough.  Large hiatal hernia.

EXAM:
UPPER GI SERIES WITH KUB
TECHNIQUE: After obtaining a scout radiograph a routine upper GI series was
performed using thin and high density barium.
FLUOROSCOPY TIME:  1 min 52 seconds

[Series 1: run · 1 of 1 slices shown (1 of 10)]
[im 1/1]
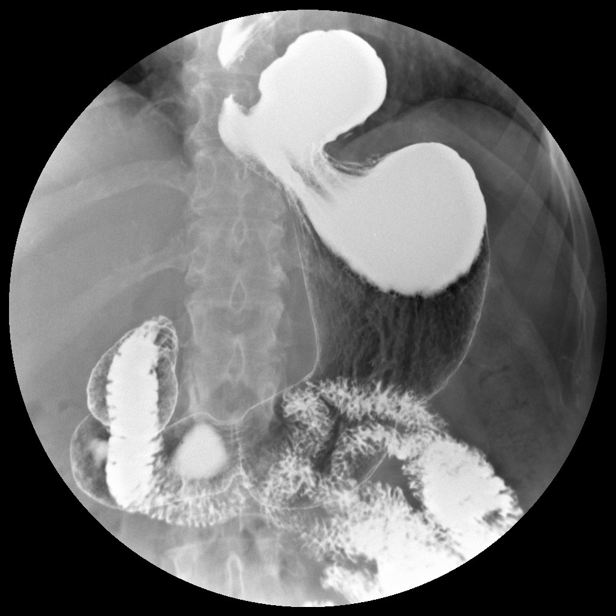

[Series 2: run · 1 of 1 slices shown (2 of 10)]
[im 1/1]
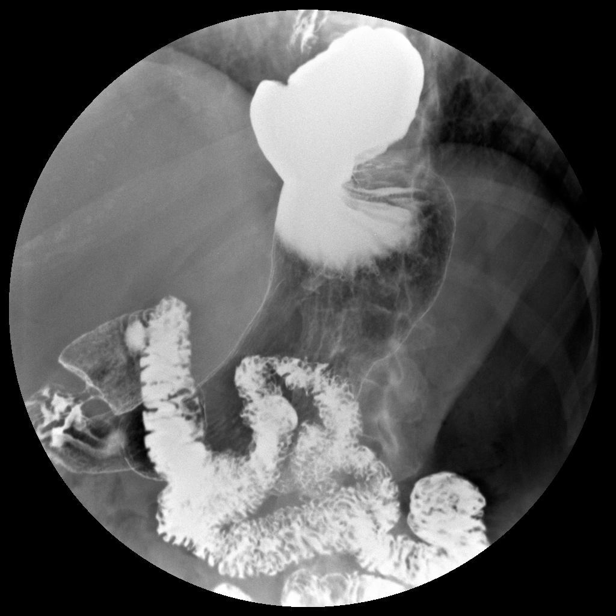

[Series 3: run · 1 of 1 slices shown (3 of 10)]
[im 1/1]
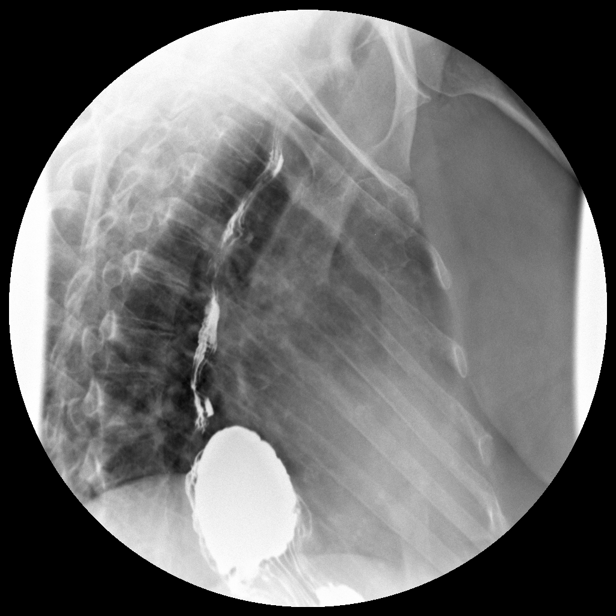

[Series 4: run · 1 of 1 slices shown (4 of 10)]
[im 1/1]
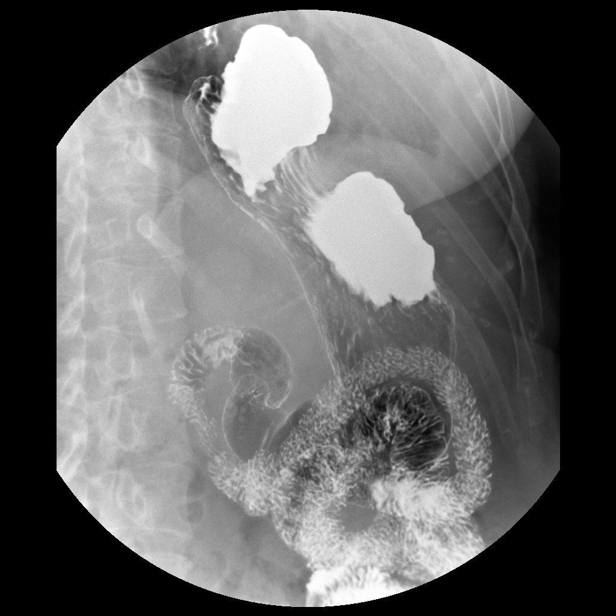

[Series 5: run · 1 of 1 slices shown (5 of 10)]
[im 1/1]
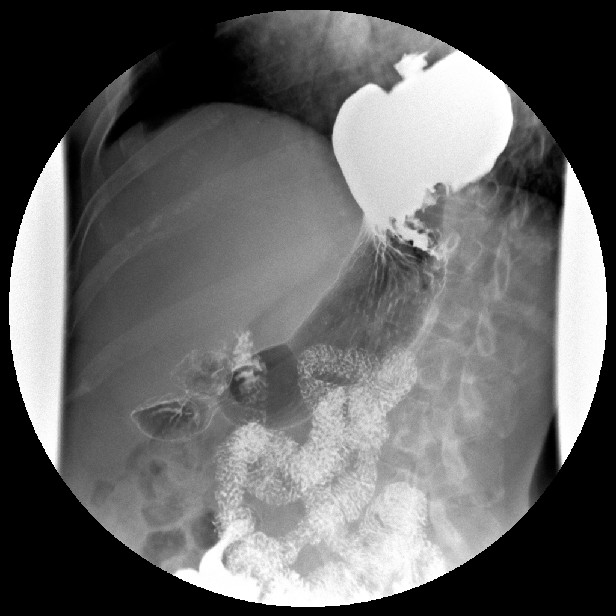

[Series 6: run · 1 of 1 slices shown (6 of 10)]
[im 1/1]
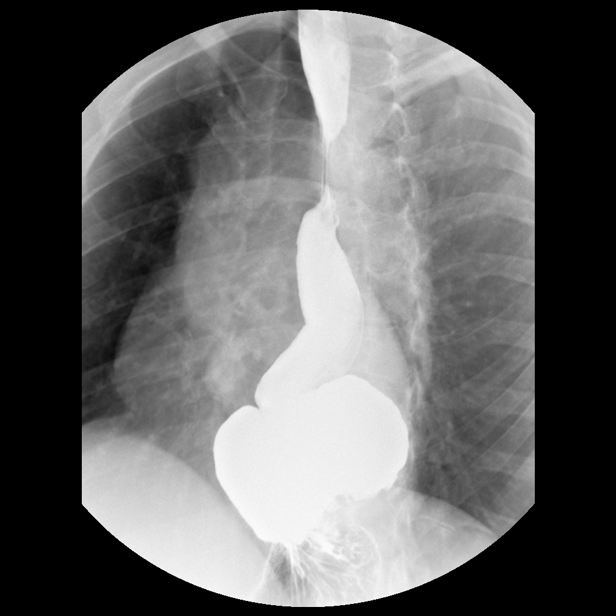

[Series 7: run · 1 of 1 slices shown (7 of 10)]
[im 1/1]
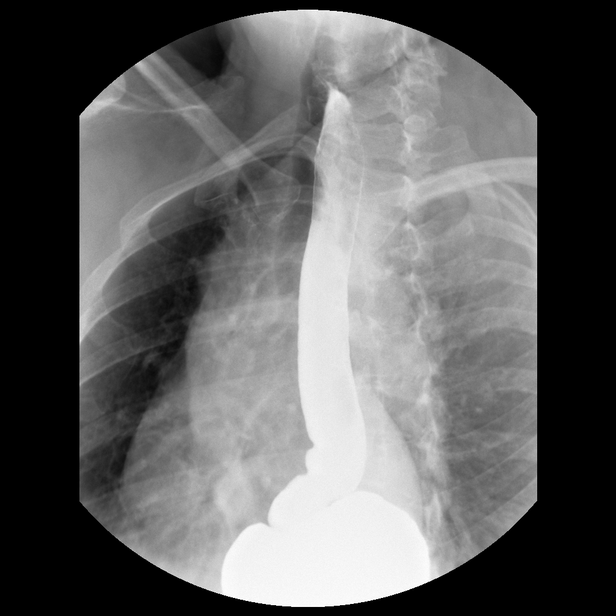

[Series 8: run · 1 of 1 slices shown (8 of 10)]
[im 1/1]
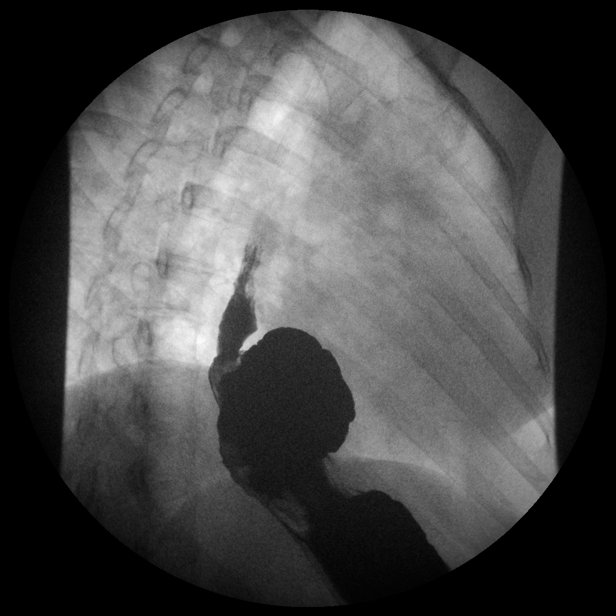

[Series 9: run · 1 of 1 slices shown (9 of 10)]
[im 1/1]
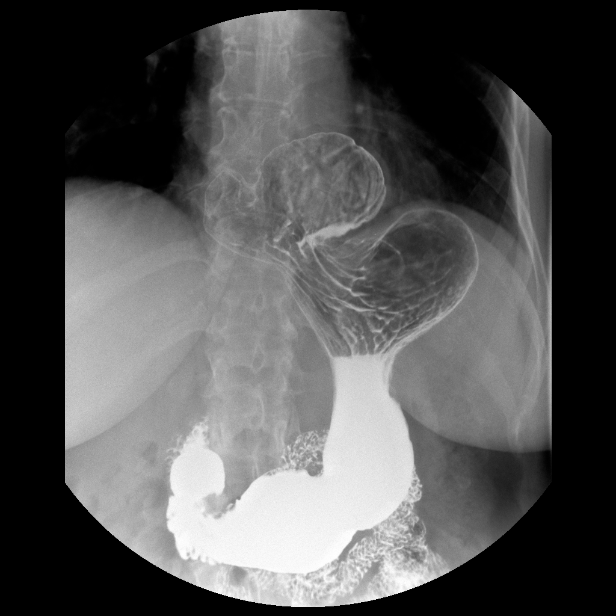

[Series 10: run · 1 of 1 slices shown (10 of 10)]
[im 1/1]
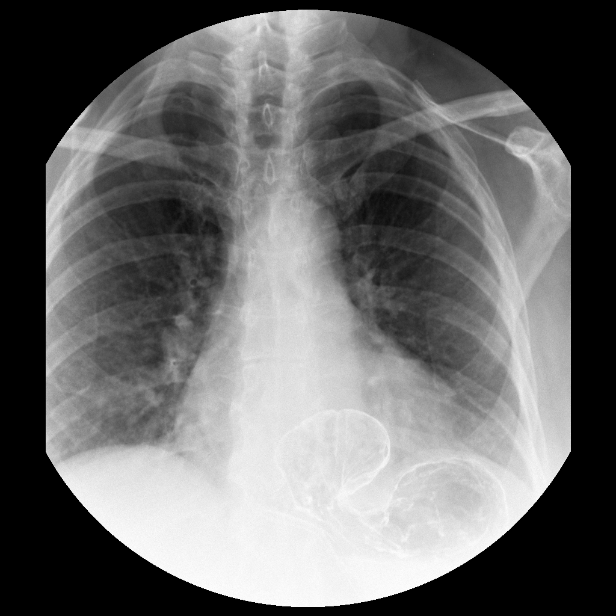

[Series 1001: view not recorded · 0.20mm/px · 1 of 1 slices shown]
[im 1/1]
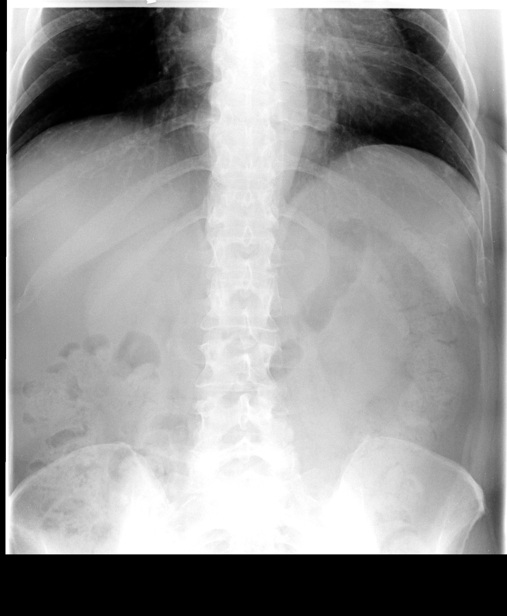

[11 of 11 positions shown; findings below may reference images not displayed]

FINDINGS: KUB demonstrates a normal bowel gas pattern. No abnormal abdominal
calcifications. Osseous structures are normal. Large hiatal hernia
is noted. The mucosa and motility of the esophagus are normal.

The hiatal hernia measures approximately 6 cm. The defect at the
hiatus is approximately 3 cm in diameter.

A 13 mm barium tablet passed immediately from the mouth to the
stomach. There is no visible esophagitis or stricture. There was no
spontaneous gastroesophageal reflux. The stomach appears normal
other than the hiatal hernia. The pylorus and duodenal bulb and
C-loop are all normal. Proximal jejunum appears normal.

Incidental note is made of peribronchial thickening in both lungs
consistent with bronchitis. Heart size is normal with no visible
effusions.
IMPRESSION: Large hiatal hernia, 6 cm in diameter. Otherwise, normal exam. No
spontaneous gastroesophageal reflux or visible esophagitis.

Bronchitic changes are noted.

## 2015-01-24 ENCOUNTER — Other Ambulatory Visit: Payer: Self-pay

## 2015-01-24 DIAGNOSIS — Z1231 Encounter for screening mammogram for malignant neoplasm of breast: Secondary | ICD-10-CM

## 2015-02-14 ENCOUNTER — Ambulatory Visit
Admission: RE | Admit: 2015-02-14 | Discharge: 2015-02-14 | Disposition: A | Discharge status: Home / Self Care | Payer: 59 | Source: Ambulatory Visit

## 2015-02-14 DIAGNOSIS — Z1231 Encounter for screening mammogram for malignant neoplasm of breast: Secondary | ICD-10-CM

## 2015-03-14 ENCOUNTER — Encounter: Payer: Self-pay | Admitting: Nurse Practitioner

## 2015-03-14 ENCOUNTER — Ambulatory Visit (INDEPENDENT_AMBULATORY_CARE_PROVIDER_SITE_OTHER): Payer: 59 | Admitting: Nurse Practitioner

## 2015-03-14 VITALS — BP 138/88 | HR 70 | Resp 14 | Ht 62.5 in | Wt 168.0 lb

## 2015-03-14 DIAGNOSIS — Z01419 Encounter for gynecological examination (general) (routine) without abnormal findings: Secondary | ICD-10-CM

## 2015-03-14 MED ORDER — ESTRADIOL 0.0375 MG/24HR TD PTTW: MEDICATED_PATCH | TRANSDERMAL | Status: DC

## 2015-03-14 MED ORDER — FESOTERODINE FUMARATE ER 8 MG PO TB24: 8.0000 mg | ORAL_TABLET | Freq: Every day | ORAL | Status: DC

## 2015-03-14 MED ORDER — ESTROGENS, CONJUGATED 0.625 MG/GM VA CREA: TOPICAL_CREAM | VAGINAL | Status: DC

## 2015-03-14 NOTE — Patient Instructions (Addendum)

## 2015-03-14 NOTE — Progress Notes (Signed)
64 y.o. G45P3003 Married  Caucasian Fe here for annual exam.  She had repair of right lower abdominal hernia and fundal plication in 1/ 8502.  After hospitalization she went back in 7 days later with FUO.  She is now back to normal and energy is normal.  Despite use of Toviaz 4 mg she still has episodes of OAB.  She would like to try a stronger dose.  Patient's last menstrual period was 06/15/2004.          Sexually active: Yes.    The current method of family planning is status post hysterectomy.    Exercising: Yes.    Walking, tennis Smoker:  no  Health Maintenance: Pap: 06/2005 Neg MMG:  02/18/15 BIRADS1:neg Colonoscopy:4/10/ 2007 Diverticulosis f/u 10 years  BMD:   03/21/13 spine: -0.6, left hip neck -1.5 TDaP:  10/09/2013 Shingles: 2016 - unsure of date Labs: PCP   reports that she quit smoking about 41 years ago. Her smoking use included Cigarettes. She has never used smokeless tobacco. She reports that she drinks about 8.4 oz of alcohol per week. She reports that she does not use illicit drugs.  Past Medical History  Diagnosis Date  . Urinary urgency   . GERD (gastroesophageal reflux disease)   . Bladder tumor     BENIGN  . Claustrophobia     Past Surgical History  Procedure Laterality Date  . Anterior cruciate ligament repair Right   . Bladder tumor excision  1992    benign  . Total abdominal hysterectomy w/ bilateral salpingoophorectomy  06/15/04    dermoid and AUB  . Abdominal hysterectomy    . Abdominal sacrocolpopexy N/A 11/06/2013    Procedure: ABDOMINO SACROCOLPOPEXY with Halban's culdoplasty;  Surgeon: Jamey Reas de Berton Lan, MD;  Location: [facility];  Service: Gynecology;  Laterality: N/A;  . Anterior and posterior repair N/A 11/06/2013    Procedure: ANTERIOR (CYSTOCELE) AND POSTERIOR REPAIR (RECTOCELE);  Surgeon: Jamey Reas de Berton Lan, MD;  Location: [facility];  Service: Gynecology;  Laterality: N/A;  . Bladder suspension N/A 11/06/2013     Procedure: TRANSVAGINAL TAPE (TVT) PROCEDURE exact midurethral sling;  Surgeon: Everardo All Amundson de Berton Lan, MD;  Location: [facility];  Service: Gynecology;  Laterality: N/A;  . Cystoscopy N/A 11/06/2013    Procedure: CYSTOSCOPY;  Surgeon: Jamey Reas de Berton Lan, MD;  Location: [facility];  Service: Gynecology;  Laterality: N/A;  . Hiatal hernia repair N/A 05/27/2014    Procedure: LAPAROSCOPIC REPAIR OF HIATAL HERNIA WITH MESH;  Surgeon: Pedro Earls, MD;  Location: [facility];  Service: General;  Laterality: N/A;  . Spigelian hernia Right 05/27/2014    Procedure: RIGHT SPIGELIAN HERNIA REPAIR WITH MESH;  Surgeon: Pedro Earls, MD;  Location: [facility];  Service: General;  Laterality: Right;  . Insertion of mesh  05/27/2014    Procedure: INSERTION OF MESH;  Surgeon: Pedro Earls, MD;  Location: [facility];  Service: General;;  two different mesh used for two different sites    Current Outpatient Prescriptions  Medication Sig Dispense Refill  . acetaminophen (TYLENOL) 500 MG tablet Take 1,000 mg by mouth every 6 (six) hours as needed for fever (fever).    . cholecalciferol (VITAMIN D) 1000 UNITS tablet Take 1,000 Units by mouth daily.    Marland Kitchen conjugated estrogens (PREMARIN) vaginal cream USE 1/2 GRAM VAGINALLY 2-3 TIMES A WEEK 30 g 4  . estradiol (MINIVELLE) 0.0375 MG/24HR PLACE 1 PATCH  ONTO THE SKIN 2 TIMES WEEKLY ANYWHERE ON LOWE ABDOMEN,CHANGE ON SUN PM AND WED AM 24 patch 3  . Multiple Vitamin (MULTIVITAMIN) tablet Take 1 tablet by mouth daily.    . Probiotic Product (ALIGN) 4 MG CAPS Take 1 capsule by mouth daily.    . ranitidine (ZANTAC) 150 MG capsule Take 150 mg by mouth as needed for heartburn.    . fesoterodine (TOVIAZ) 8 MG TB24 tablet Take 1 tablet (8 mg total) by mouth daily. 30 tablet 2   No current facility-administered medications for this visit.    Family History  Problem Relation Age of Onset  . Cancer Maternal Grandfather     ROS:  Pertinent items are noted  in HPI.  Otherwise, a comprehensive ROS was negative.  Exam:   BP 138/88 mmHg  Pulse 70  Resp 14  Ht 5' 2.5" (1.588 m)  Wt 168 lb (76.204 kg)  BMI 30.22 kg/m2  LMP 06/15/2004 Height: 5' 2.5" (158.8 cm) Ht Readings from Last 3 Encounters:  03/14/15 5' 2.5" (1.588 m)  08/20/14 5\' 3"  (1.6 m)  08/05/14 5\' 3"  (1.6 m)    General appearance: alert, cooperative and appears stated age Head: Normocephalic, without obvious abnormality, atraumatic Neck: no adenopathy, supple, symmetrical, trachea midline and thyroid normal to inspection and palpation Lungs: clear to auscultation bilaterally Breasts: normal appearance, no masses or tenderness Heart: regular rate and rhythm Abdomen: soft, non-tender; no masses,  no organomegaly Extremities: extremities normal, atraumatic, no cyanosis or edema Skin: Skin color, texture, turgor normal. No rashes or lesions Lymph nodes: Cervical, supraclavicular, and axillary nodes normal. No abnormal inguinal nodes palpated Neurologic: Grossly normal   Pelvic: External genitalia:  no lesions              Urethra:  normal appearing urethra with no masses, tenderness or lesions              Bartholin's and Skene's: normal                 Vagina: normal appearing vagina with normal color and discharge, no lesions              Cervix: absent              Pap taken: No. Bimanual Exam:  Uterus:  uterus absent              Adnexa: no mass, fullness, tenderness               Rectovaginal: Confirms               Anus:  normal sphincter tone, no lesions  Chaperone present: no  A:  Well Woman with normal exam  S/P TAH / BSO 06/15/04 secondary to AUB and dermoid with ERT since then  Vit D deficiency S/P abdominal sacrocolpopexy 11/06/13 Right lower abdominal wall Spigelian hernia - with repair with mesh along with hiatal hernia repair 05/2014   P:   Reviewed health and wellness pertinent to exam  Pap smear as  above  Mammogram is due 02/2016  Refill on ERT Minivelle patches and will try to taper to every 4 days, then every 5 days this winter  Refill on Premarin vaginal cream 1-2 times a week  Counseled with risk of DVT, CVA, cancer, etc.  RX change on Toviaz from 4 mg to 8 mg daily - she will try this for next month or so and see if OAB is better.  If too much dryness  then to call back for lower dose of Toviaz.  Counseled on breast self exam, mammography screening, use and side effects of HRT, adequate intake of calcium and vitamin D, diet and exercise return annually or prn  An After Visit Summary was printed and given to the patient.

## 2015-03-16 NOTE — Progress Notes (Signed)
Encounter reviewed by Dr. Brook Amundson C. Silva.  

## 2015-03-18 ENCOUNTER — Telehealth: Payer: Self-pay | Admitting: Nurse Practitioner

## 2015-03-18 NOTE — Telephone Encounter (Signed)
Routing to Kem Boroughs, FNP for review. Does patient need Hepatitis C screening?

## 2015-03-18 NOTE — Telephone Encounter (Signed)
Yes she does need this screening per guidelines -she was getting her PCP to do her labs and wanted to add this tho their labs and then get results for Epic chart.

## 2015-03-18 NOTE — Telephone Encounter (Signed)
Patient calling wanting to let patient know when she had her shingles vaccine it was 11/20/2014 (already entered it in). Patient also says that Ms. Patty talked about her getting a HEP C test done, does she still need to do that? Best contact [phone]

## 2015-03-19 ENCOUNTER — Other Ambulatory Visit: Payer: Self-pay | Admitting: Nurse Practitioner

## 2015-03-19 DIAGNOSIS — Z0000 Encounter for general adult medical examination without abnormal findings: Secondary | ICD-10-CM

## 2015-03-19 NOTE — Telephone Encounter (Signed)
Spoke with patient. She had annual with PCP in June. Would like to have screening completed.  Lab appointment scheduled for 03/24/15. Patty can you enter order?

## 2015-03-22 ENCOUNTER — Other Ambulatory Visit: Payer: Self-pay | Admitting: Nurse Practitioner

## 2015-03-24 ENCOUNTER — Other Ambulatory Visit (INDEPENDENT_AMBULATORY_CARE_PROVIDER_SITE_OTHER): Payer: 59

## 2015-03-24 DIAGNOSIS — Z0000 Encounter for general adult medical examination without abnormal findings: Secondary | ICD-10-CM

## 2015-03-24 LAB — HEPATITIS C ANTIBODY: HCV Ab: NEGATIVE

## 2015-03-25 LAB — HIV ANTIBODY (ROUTINE TESTING W REFLEX): HIV 1&2 Ab, 4th Generation: NONREACTIVE

## 2015-03-27 ENCOUNTER — Other Ambulatory Visit: Payer: Self-pay | Admitting: Nurse Practitioner

## 2015-05-28 ENCOUNTER — Other Ambulatory Visit: Payer: Self-pay | Admitting: Gastroenterology

## 2015-05-28 DIAGNOSIS — R1031 Right lower quadrant pain: Secondary | ICD-10-CM

## 2015-05-28 DIAGNOSIS — R1032 Left lower quadrant pain: Secondary | ICD-10-CM

## 2015-05-29 ENCOUNTER — Ambulatory Visit
Admission: RE | Admit: 2015-05-29 | Discharge: 2015-05-29 | Disposition: A | Discharge status: Home / Self Care | Payer: 59 | Source: Ambulatory Visit | Attending: Gastroenterology | Admitting: Gastroenterology

## 2015-05-29 ENCOUNTER — Encounter: Payer: Self-pay | Admitting: Obstetrics and Gynecology

## 2015-05-29 DIAGNOSIS — K449 Diaphragmatic hernia without obstruction or gangrene: Secondary | ICD-10-CM

## 2015-05-29 DIAGNOSIS — R1031 Right lower quadrant pain: Secondary | ICD-10-CM

## 2015-05-29 DIAGNOSIS — K5732 Diverticulitis of large intestine without perforation or abscess without bleeding: Secondary | ICD-10-CM

## 2015-05-29 DIAGNOSIS — R1032 Left lower quadrant pain: Secondary | ICD-10-CM

## 2015-05-29 HISTORY — DX: Diverticulitis of large intestine without perforation or abscess without bleeding: K57.32

## 2015-05-29 HISTORY — DX: Diaphragmatic hernia without obstruction or gangrene: K44.9

## 2015-05-29 MED ORDER — IOPAMIDOL (ISOVUE-300) INJECTION 61%
100.0000 mL | Freq: Once | INTRAVENOUS | Status: AC | PRN
  Administered 2015-05-29: 100 mL via INTRAVENOUS

## 2015-06-02 ENCOUNTER — Encounter: Payer: Self-pay | Admitting: Obstetrics and Gynecology

## 2016-01-16 ENCOUNTER — Other Ambulatory Visit: Payer: Self-pay | Admitting: Family Medicine

## 2016-01-16 DIAGNOSIS — Z1231 Encounter for screening mammogram for malignant neoplasm of breast: Secondary | ICD-10-CM

## 2016-02-17 ENCOUNTER — Ambulatory Visit
Admission: RE | Admit: 2016-02-17 | Discharge: 2016-02-17 | Disposition: A | Discharge status: Home / Self Care | Payer: 59 | Source: Ambulatory Visit | Attending: Family Medicine | Admitting: Family Medicine

## 2016-02-17 DIAGNOSIS — Z1231 Encounter for screening mammogram for malignant neoplasm of breast: Secondary | ICD-10-CM

## 2016-03-03 ENCOUNTER — Telehealth: Payer: Self-pay | Admitting: Nurse Practitioner

## 2016-03-03 NOTE — Telephone Encounter (Signed)
Left voicemail message to call and reschedule cancelled appointment.

## 2016-03-15 ENCOUNTER — Other Ambulatory Visit: Payer: Self-pay | Admitting: Nurse Practitioner

## 2016-03-15 NOTE — Telephone Encounter (Signed)
Medication refill request: [place] Last AEX: 03-13-16  Next AEX: 04-07-16 Last MMG (if hormonal medication request): 02-20-16 WNL  Refill authorized: please advise

## 2016-03-22 ENCOUNTER — Ambulatory Visit: Payer: 59 | Admitting: Nurse Practitioner

## 2016-04-07 ENCOUNTER — Ambulatory Visit (INDEPENDENT_AMBULATORY_CARE_PROVIDER_SITE_OTHER): Payer: 59 | Admitting: Nurse Practitioner

## 2016-04-07 ENCOUNTER — Encounter: Payer: Self-pay | Admitting: Nurse Practitioner

## 2016-04-07 VITALS — BP 156/84 | HR 68 | Ht 62.25 in | Wt 155.0 lb

## 2016-04-07 DIAGNOSIS — E2839 Other primary ovarian failure: Secondary | ICD-10-CM | POA: Diagnosis not present

## 2016-04-07 DIAGNOSIS — E559 Vitamin D deficiency, unspecified: Secondary | ICD-10-CM | POA: Diagnosis not present

## 2016-04-07 DIAGNOSIS — Z01419 Encounter for gynecological examination (general) (routine) without abnormal findings: Secondary | ICD-10-CM

## 2016-04-07 DIAGNOSIS — Z79890 Hormone replacement therapy (postmenopausal): Secondary | ICD-10-CM

## 2016-04-07 MED ORDER — ESTRADIOL 0.025 MG/24HR TD PTTW: 1.0000 | MEDICATED_PATCH | TRANSDERMAL | 4 refills | Status: DC

## 2016-04-07 NOTE — Progress Notes (Signed)
Patient ID: [name], female   DOB: 1951/01/14, 65 y.o.   MRN: [id number]  65 y.o. G3P3003 Married  Caucasian Fe here for annual exam.  Off Toviaz, no bladder discomfort.  Still some urgency but did not like SE of medication..  Using Minivelle and is OK to try and reduce dose again.  She uses the patch most of times once a week.  Patient's last menstrual period was 06/15/2004.          Sexually active: Yes.    The current method of family planning is status post hysterectomy.    Exercising: Yes.    walking and tennis Smoker:  no  Health Maintenance: Pap: 06/2005 Negative - Hysterectomy for AUB and fibroids MMG: 02/17/16, Bi-Rads 1: Negative Colonoscopy: 4/10/ 2017 Diverticulosis f/u 10 years  BMD:   03/21/13 spine: -0.6, left hip neck -1.5 TDaP:  10/09/2013 Shingles: 2016 Hep C and HIV: 03/24/15 Labs: PCP takes care of screening labs.   reports that she quit smoking about 42 years ago. Her smoking use included Cigarettes. She has never used smokeless tobacco. She reports that she drinks about 8.4 oz of alcohol per week . She reports that she does not use drugs.  Past Medical History:  Diagnosis Date  . Bladder tumor    BENIGN  . Claustrophobia   . Elevated liver function tests 2016  . GERD (gastroesophageal reflux disease)   . Hiatal hernia 05/29/15   CT scan of abdomen and pelvis  . Sigmoid diverticulitis 05/29/15   CT scan of abdomen and pelvis  . Urinary urgency     Past Surgical History:  Procedure Laterality Date  . ABDOMINAL HYSTERECTOMY    . ABDOMINAL SACROCOLPOPEXY N/A 11/06/2013   Procedure: ABDOMINO SACROCOLPOPEXY with Halban's culdoplasty;  Surgeon: Jamey Reas de Berton Lan, MD;  Location: [facility];  Service: Gynecology;  Laterality: N/A;  . ANTERIOR AND POSTERIOR REPAIR N/A 11/06/2013   Procedure: ANTERIOR (CYSTOCELE) AND POSTERIOR REPAIR (RECTOCELE);  Surgeon: Jamey Reas de Berton Lan, MD;  Location: [facility];  Service: Gynecology;  Laterality:  N/A;  . ANTERIOR CRUCIATE LIGAMENT REPAIR Right   . BLADDER SUSPENSION N/A 11/06/2013   Procedure: TRANSVAGINAL TAPE (TVT) PROCEDURE exact midurethral sling;  Surgeon: Everardo All Amundson de Berton Lan, MD;  Location: [facility];  Service: Gynecology;  Laterality: N/A;  . BLADDER TUMOR EXCISION  1992   benign  . CYSTOSCOPY N/A 11/06/2013   Procedure: CYSTOSCOPY;  Surgeon: Jamey Reas de Berton Lan, MD;  Location: [facility];  Service: Gynecology;  Laterality: N/A;  . HIATAL HERNIA REPAIR N/A 05/27/2014   Procedure: LAPAROSCOPIC REPAIR OF HIATAL HERNIA WITH MESH;  Surgeon: Pedro Earls, MD;  Location: [facility];  Service: General;  Laterality: N/A;  . INSERTION OF MESH  05/27/2014   Procedure: INSERTION OF MESH;  Surgeon: Pedro Earls, MD;  Location: [facility];  Service: General;;  two different mesh used for two different sites  . SPIGELIAN HERNIA Right 05/27/2014   Procedure: RIGHT SPIGELIAN HERNIA REPAIR WITH MESH;  Surgeon: Pedro Earls, MD;  Location: [facility];  Service: General;  Laterality: Right;  . TOTAL ABDOMINAL HYSTERECTOMY W/ BILATERAL SALPINGOOPHORECTOMY  06/15/04   dermoid and AUB    Current Outpatient Prescriptions  Medication Sig Dispense Refill  . acetaminophen (TYLENOL) 500 MG tablet Take 1,000 mg by mouth every 6 (six) hours as needed for fever (fever).    . cholecalciferol (VITAMIN D) 1000 UNITS tablet  Take 1,000 Units by mouth daily.    Marland Kitchen conjugated estrogens (PREMARIN) vaginal cream USE 1/2 GRAM VAGINALLY 2-3 TIMES A WEEK 30 g 4  . fesoterodine (TOVIAZ) 8 MG TB24 tablet Take 1 tablet (8 mg total) by mouth daily. 30 tablet 2  . MINIVELLE 0.0375 MG/24HR PLACE 1 PATCH ONTO THE SKIN 2 TIMES WEEKLY ANYWHERE ON LOWE ABDOMEN,CHANGE ON SUN PM AND WED AM 24 patch 0  . Multiple Vitamin (MULTIVITAMIN) tablet Take 1 tablet by mouth daily.    . Probiotic Product (ALIGN) 4 MG CAPS Take 1 capsule by mouth daily.    . ranitidine (ZANTAC) 150 MG capsule Take 150 mg by mouth as  needed for heartburn.    . TOVIAZ 4 MG TB24 tablet TAKE 1 TABLET BY MOUTH DAILY 90 tablet 0   No current facility-administered medications for this visit.     Family History  Problem Relation Age of Onset  . Cancer Maternal Grandfather     ROS:  Pertinent items are noted in HPI.  Otherwise, a comprehensive ROS was negative.  Exam:   LMP 06/15/2004    Ht Readings from Last 3 Encounters:  03/14/15 5' 2.5" (1.588 m)  08/20/14 5\' 3"  (1.6 m)  08/05/14 5\' 3"  (1.6 m)    General appearance: alert, cooperative and appears stated age Head: Normocephalic, without obvious abnormality, atraumatic Neck: no adenopathy, supple, symmetrical, trachea midline and thyroid normal to inspection and palpation Lungs: clear to auscultation bilaterally Breasts: normal appearance, no masses or tenderness Heart: regular rate and rhythm Abdomen: soft, non-tender; no masses,  no organomegaly Extremities: extremities normal, atraumatic, no cyanosis or edema Skin: Skin color, texture, turgor normal. No rashes or lesions Lymph nodes: Cervical, supraclavicular, and axillary nodes normal. No abnormal inguinal nodes palpated Neurologic: Grossly normal   Pelvic: External genitalia:  no lesions              Urethra:  normal appearing urethra with no masses, tenderness or lesions              Bartholin's and Skene's: normal                 Vagina: normal appearing vagina with normal color and discharge, no lesions              Cervix: absent              Pap taken: No. Bimanual Exam:  Uterus:  uterus absent              Adnexa: no mass, fullness, tenderness               Rectovaginal: Confirms               Anus:  normal sphincter tone, no lesions  Chaperone present: yes  A:  Well Woman with normal exam   S/P TAH / BSO 06/15/04 secondary to AUB and dermoid with ERT since then  Vit D deficiency S/P abdominal sacrocolpopexy 11/06/13 S/P hiatal hernia repair and right  lateral ventral hernia (Spigelian hernia) repair, Dr. Kaylyn Lim 05/2014   P:   Reviewed health and wellness pertinent to exam  Pap smear as above  Mammogram is due 10/18  Change Minivelle to 0.025 mg 1-2 times a week for a yr.  Counseled with risk of DVT, CVA, cancer, etc.  Order for BMD  Counseled on breast self exam, mammography screening, use and side effects of HRT, adequate intake of calcium and vitamin D, diet and exercise,  Kegel's exercises return annually or prn  An After Visit Summary was printed and given to the patient.

## 2016-04-07 NOTE — Patient Instructions (Addendum)

## 2016-04-08 NOTE — Progress Notes (Signed)
Consider dosing HRT 1/2 patch twice weekly.  Reviewed personally.  Felipa Emory, MD.

## 2016-04-14 ENCOUNTER — Telehealth: Payer: Self-pay | Admitting: Nurse Practitioner

## 2016-04-14 NOTE — Telephone Encounter (Signed)
On the bottom of her AVS form is the name of Oxytrol patches that are now OTC.

## 2016-04-14 NOTE — Telephone Encounter (Signed)
Patient was seen 04/07/16 and forgot the name of the OTC patch that Edman Circle, FNP had recommenced.

## 2016-04-14 NOTE — Telephone Encounter (Signed)
Left pt a message that we got copy of labs from PCP but it did not include Vit D.  She was informed that she could come back here to do or get at PCP.  The Immunization record was also sent and will update Epic.

## 2016-04-14 NOTE — Telephone Encounter (Signed)
Spoke with patient. Patient states Alyssa Boroughs, NP Recommended OTC patch for controlling bladder, but patient did not get the name of it. Advised RN will review with Alyssa Boroughs, NP and return call. Patient is agreeable.  Alyssa Boroughs, NP -please advise?

## 2016-04-15 NOTE — Telephone Encounter (Signed)
Left detailed message, ok per current dpr. Advised as seen below per Kem Boroughs, NP. Advised to return call with any additional questions.  Routing to provider for final review. Patient is agreeable to disposition. Will close encounter.

## 2016-04-16 ENCOUNTER — Other Ambulatory Visit (INDEPENDENT_AMBULATORY_CARE_PROVIDER_SITE_OTHER): Payer: 59

## 2016-04-16 DIAGNOSIS — E559 Vitamin D deficiency, unspecified: Secondary | ICD-10-CM

## 2016-04-16 DIAGNOSIS — Z0000 Encounter for general adult medical examination without abnormal findings: Secondary | ICD-10-CM

## 2016-04-17 LAB — VITAMIN D 25 HYDROXY (VIT D DEFICIENCY, FRACTURES): VIT D 25 HYDROXY: 36 ng/mL (ref 30–100)

## 2016-05-17 DIAGNOSIS — M8580 Other specified disorders of bone density and structure, unspecified site: Secondary | ICD-10-CM

## 2016-05-17 HISTORY — DX: Other specified disorders of bone density and structure, unspecified site: M85.80

## 2016-07-30 ENCOUNTER — Ambulatory Visit (INDEPENDENT_AMBULATORY_CARE_PROVIDER_SITE_OTHER): Payer: 59 | Admitting: Podiatry

## 2016-07-30 ENCOUNTER — Ambulatory Visit: Payer: 59

## 2016-07-30 ENCOUNTER — Encounter: Payer: Self-pay | Admitting: Podiatry

## 2016-07-30 ENCOUNTER — Ambulatory Visit (INDEPENDENT_AMBULATORY_CARE_PROVIDER_SITE_OTHER): Payer: 59

## 2016-07-30 VITALS — BP 155/84 | HR 78 | Resp 16 | Ht 63.0 in | Wt 163.0 lb

## 2016-07-30 DIAGNOSIS — M79672 Pain in left foot: Secondary | ICD-10-CM

## 2016-07-30 DIAGNOSIS — M779 Enthesopathy, unspecified: Secondary | ICD-10-CM | POA: Diagnosis not present

## 2016-07-30 DIAGNOSIS — B351 Tinea unguium: Secondary | ICD-10-CM

## 2016-07-30 DIAGNOSIS — M7750 Other enthesopathy of unspecified foot: Secondary | ICD-10-CM

## 2016-07-30 MED ORDER — TRIAMCINOLONE ACETONIDE 10 MG/ML IJ SUSP
10.0000 mg | Freq: Once | INTRAMUSCULAR | Status: AC
  Administered 2016-07-30: 10 mg

## 2016-07-30 NOTE — Progress Notes (Signed)
   Subjective:    Patient ID: [name], female    DOB: 01-23-1951, 66 y.o.   MRN: [id number]  HPI  Chief Complaint  Patient presents with  . Foot Pain    Left foot; medial side; pt stated, "Plays tennis and walks; has inserts that are helping with pain:; x6 months     Review of Systems  Musculoskeletal: Positive for arthralgias and myalgias.  All other systems reviewed and are negative.      Objective:   Physical Exam        Assessment & Plan:

## 2016-08-01 NOTE — Progress Notes (Signed)
Subjective:     Patient ID: [name], female   DOB: [birth year], 66 y.o.   MRN: [id number]  HPI patient presents with a lot of pain in the left medial ankle of several months duration with patient being very active and playing a lot of tennis. Also noted to have nail disease with yellowness that she conveys to me at this time   Review of Systems  All other systems reviewed and are negative.      Objective:   Physical Exam  Constitutional: She is oriented to person, place, and time.  Cardiovascular: Intact distal pulses.   Musculoskeletal: Normal range of motion.  Neurological: She is oriented to person, place, and time.  Skin: Skin is warm.  Nursing note and vitals reviewed.  Neurovascular status found to be intact with muscle strength adequate range of motion within normal limits and no indication left that there is a dysfunction of the posterior tibial tendon. There is edema of a mild to moderate nature around the distal posterior tibial tendon as it inserts into the navicular and there is nail discoloration that is currently covered by pain. Good digital perfusion well oriented 3     Assessment:     Medial tendinitis left with collapse medial longitudinal arch is part of the problem and probable mycotic nail infection    Plan:     H&P x-ray reviewed and careful sheath injection administered left 3 mg Kenalog 5 mg Xylocaine at its insertion into the navicular and applied a fascial brace to lift up the medial side of the foot. Discussed the possibilities for laser therapy and oral medicine for her nail disease and I did discuss we will only do a pulse type treatment for her  X-ray report indicated moderate depression of the arch with weightbearing and a small accessory navicular which could be part of her problem

## 2016-08-13 ENCOUNTER — Ambulatory Visit (INDEPENDENT_AMBULATORY_CARE_PROVIDER_SITE_OTHER): Payer: 59 | Admitting: Podiatry

## 2016-08-13 DIAGNOSIS — B351 Tinea unguium: Secondary | ICD-10-CM | POA: Diagnosis not present

## 2016-08-13 DIAGNOSIS — M779 Enthesopathy, unspecified: Secondary | ICD-10-CM | POA: Diagnosis not present

## 2016-08-13 NOTE — Progress Notes (Signed)
Subjective:     Patient ID: [name], female   DOB: 1950-07-08, 66 y.o.   MRN: [id number]  HPI patient presents stating that her foot is feeling quite a bit better but she still has some nail disease and she wanted to have it looked at   Review of Systems     Objective:   Physical Exam Neurovascular status intact with patient having a depressed arch bilateral and was found to have inflammation left posterior tib which has improved quite a bit with muscle strength is adequate. Also has nail disease bilateral with thick brittle type debris    Assessment:     Posterior tibial tendinitis improved but present with depression of the arch is part of the pathology and nail disease    Plan:     H&P conditions reviewed and scanned for custom orthotics to lift up plantar arch and take stress off the posterior tibial tendon. Discussed ice and do not recommend treatment for the nails that she's a very active tennis player and I think this is where most the pathology comes

## 2016-09-03 ENCOUNTER — Ambulatory Visit (INDEPENDENT_AMBULATORY_CARE_PROVIDER_SITE_OTHER): Payer: Self-pay | Admitting: Podiatry

## 2016-09-03 DIAGNOSIS — M779 Enthesopathy, unspecified: Secondary | ICD-10-CM

## 2016-09-03 NOTE — Progress Notes (Signed)
Patient presents for orthotic pick up.  Verbal and written break in and wear instructions given.  Patient will follow up in 4 weeks if symptoms worsen or fail to improve. 

## 2016-09-03 NOTE — Patient Instructions (Signed)

## 2016-10-04 ENCOUNTER — Ambulatory Visit (INDEPENDENT_AMBULATORY_CARE_PROVIDER_SITE_OTHER): Payer: 59

## 2016-10-04 ENCOUNTER — Ambulatory Visit (INDEPENDENT_AMBULATORY_CARE_PROVIDER_SITE_OTHER): Payer: 59 | Admitting: Podiatry

## 2016-10-04 DIAGNOSIS — M79674 Pain in right toe(s): Secondary | ICD-10-CM

## 2016-10-04 DIAGNOSIS — M2041 Other hammer toe(s) (acquired), right foot: Secondary | ICD-10-CM | POA: Diagnosis not present

## 2016-10-04 DIAGNOSIS — M779 Enthesopathy, unspecified: Secondary | ICD-10-CM

## 2016-10-04 MED ORDER — TRIAMCINOLONE ACETONIDE 10 MG/ML IJ SUSP
10.0000 mg | Freq: Once | INTRAMUSCULAR | Status: AC
  Administered 2016-10-04: 10 mg

## 2016-10-06 NOTE — Progress Notes (Signed)
Subjective:    Patient ID: [name], female   DOB: 66 y.o.   MRN: [id number]   HPI patient presents stating that she's been getting pain in the metatarsal joint and that it seems that she's developed some digital deformity and she does not remember any specific injury    ROS      Objective:  Physical Exam Neurovascular status intact muscle strength adequate with inflammation and pain around the second MPJ right with dorsal medial deviation of the second digit which is been more acute in its orientation    Assessment:    Appears to be inflammatory capsulitis second MPJ right with structural hammertoe deformity second digit right with possibility for flexor plate disruption     Plan:    H&P condition reviewed and at this time I did a proximal nerve block right aspirated the joint was able to get out a small amount of clear fluid and injected quarter cc deck some some Kenalog and applied thick padding to reduce stress. Discussed hammertoe deformity and the possibility for correction in future  X-ray indicated there is moderate dorsal medial dislocation deviation of the second digit right

## 2016-10-18 ENCOUNTER — Encounter: Payer: Self-pay | Admitting: Podiatry

## 2016-10-18 ENCOUNTER — Ambulatory Visit (INDEPENDENT_AMBULATORY_CARE_PROVIDER_SITE_OTHER): Payer: 59 | Admitting: Podiatry

## 2016-10-18 DIAGNOSIS — M2041 Other hammer toe(s) (acquired), right foot: Secondary | ICD-10-CM | POA: Diagnosis not present

## 2016-10-18 DIAGNOSIS — M779 Enthesopathy, unspecified: Secondary | ICD-10-CM | POA: Diagnosis not present

## 2016-10-18 NOTE — Progress Notes (Signed)
Subjective:    Patient ID: [name], female   DOB: 66 y.o.   MRN: [id number]   HPI patient states that the right foot doing better the left but overall I'm doing pretty well    ROS      Objective:  Physical Exam neurovascular status intact negative Homans sign was noted with patient's right foot showing diminished inflammation discomfort in the left foot showing mild discomfort in the second MPJ     Assessment:  Capsulitis of the second MPJ right left improving       Plan:  Recommended conservative care shoe gear modifications and padding and reappoint as needed

## 2016-12-08 ENCOUNTER — Telehealth: Payer: Self-pay | Admitting: Obstetrics and Gynecology

## 2016-12-08 NOTE — Telephone Encounter (Signed)
Left message to reschedule [name] appt

## 2017-02-22 ENCOUNTER — Other Ambulatory Visit: Payer: Self-pay | Admitting: Family Medicine

## 2017-02-22 ENCOUNTER — Telehealth: Payer: Self-pay | Admitting: Obstetrics and Gynecology

## 2017-02-22 DIAGNOSIS — E2839 Other primary ovarian failure: Secondary | ICD-10-CM

## 2017-02-22 DIAGNOSIS — Z1231 Encounter for screening mammogram for malignant neoplasm of breast: Secondary | ICD-10-CM

## 2017-02-22 NOTE — Telephone Encounter (Signed)
Patient called requesting new order be placed for bone densiity test. Last order placed was under [name] to [facility]. Patient's next annual is with Dr. Josefa Half in January 2019. Please advise.

## 2017-02-22 NOTE — Telephone Encounter (Signed)
Spoke with patient. Patient states she called to schedule BMD, was advised order had expired, needs new order.  Advised new order placed, may call [facility] directly for scheduling. Patient verbalizes understanding and is agreeable.  Routing to provider for final review. Patient is agreeable to disposition. Will close encounter.

## 2017-03-10 ENCOUNTER — Observation Stay (HOSPITAL_COMMUNITY)
Admission: EM | Admit: 2017-03-10 | Discharge: 2017-03-11 | Disposition: A | Discharge status: Home / Self Care | Payer: 59 | Attending: Family Medicine | Admitting: Family Medicine

## 2017-03-10 ENCOUNTER — Encounter (HOSPITAL_COMMUNITY): Payer: Self-pay | Admitting: Nurse Practitioner

## 2017-03-10 DIAGNOSIS — Z79899 Other long term (current) drug therapy: Secondary | ICD-10-CM | POA: Diagnosis not present

## 2017-03-10 DIAGNOSIS — R1013 Epigastric pain: Secondary | ICD-10-CM

## 2017-03-10 DIAGNOSIS — Z87891 Personal history of nicotine dependence: Secondary | ICD-10-CM | POA: Insufficient documentation

## 2017-03-10 DIAGNOSIS — R748 Abnormal levels of other serum enzymes: Secondary | ICD-10-CM | POA: Diagnosis not present

## 2017-03-10 DIAGNOSIS — R778 Other specified abnormalities of plasma proteins: Secondary | ICD-10-CM | POA: Diagnosis not present

## 2017-03-10 DIAGNOSIS — Z7982 Long term (current) use of aspirin: Secondary | ICD-10-CM | POA: Diagnosis not present

## 2017-03-10 DIAGNOSIS — K219 Gastro-esophageal reflux disease without esophagitis: Secondary | ICD-10-CM | POA: Diagnosis not present

## 2017-03-10 DIAGNOSIS — R7989 Other specified abnormal findings of blood chemistry: Secondary | ICD-10-CM | POA: Diagnosis not present

## 2017-03-10 LAB — URINALYSIS, ROUTINE W REFLEX MICROSCOPIC
Bacteria, UA: NONE SEEN
Bilirubin Urine: NEGATIVE
Glucose, UA: NEGATIVE mg/dL
KETONES UR: 20 mg/dL — AB
Leukocytes, UA: NEGATIVE
Nitrite: NEGATIVE
PH: 5 (ref 5.0–8.0)
PROTEIN: NEGATIVE mg/dL
Specific Gravity, Urine: 1.02 (ref 1.005–1.030)

## 2017-03-10 LAB — CBC
HCT: 46.4 % — ABNORMAL HIGH (ref 36.0–46.0)
Hemoglobin: 15.8 g/dL — ABNORMAL HIGH (ref 12.0–15.0)
MCH: 33.5 pg (ref 26.0–34.0)
MCHC: 34.1 g/dL (ref 30.0–36.0)
MCV: 98.3 fL (ref 78.0–100.0)
PLATELETS: 369 10*3/uL (ref 150–400)
RBC: 4.72 MIL/uL (ref 3.87–5.11)
RDW: 12.6 % (ref 11.5–15.5)
WBC: 15 10*3/uL — ABNORMAL HIGH (ref 4.0–10.5)

## 2017-03-10 LAB — COMPREHENSIVE METABOLIC PANEL
ALBUMIN: 4.7 g/dL (ref 3.5–5.0)
ALK PHOS: 84 U/L (ref 38–126)
ALT: 25 U/L (ref 14–54)
AST: 30 U/L (ref 15–41)
Anion gap: 14 (ref 5–15)
BUN: 15 mg/dL (ref 6–20)
CHLORIDE: 97 mmol/L — AB (ref 101–111)
CO2: 24 mmol/L (ref 22–32)
CREATININE: 0.76 mg/dL (ref 0.44–1.00)
Calcium: 9.7 mg/dL (ref 8.9–10.3)
GFR calc non Af Amer: >60 mL/min (ref >60)
GLUCOSE: 110 mg/dL — AB (ref 65–99)
Potassium: 4.9 mmol/L (ref 3.5–5.1)
SODIUM: 135 mmol/L (ref 135–145)
Total Bilirubin: 1 mg/dL (ref 0.3–1.2)
Total Protein: 7.8 g/dL (ref 6.5–8.1)

## 2017-03-10 LAB — LIPASE, BLOOD: Lipase: 21 U/L (ref 11–51)

## 2017-03-10 MED ORDER — ONDANSETRON 4 MG PO TBDP
4.0000 mg | ORAL_TABLET | Freq: Once | ORAL | Status: AC | PRN
  Administered 2017-03-10: 4 mg via ORAL

## 2017-03-10 MED ORDER — ONDANSETRON 4 MG PO TBDP
ORAL_TABLET | ORAL | Status: AC
  Filled 2017-03-10: qty 1

## 2017-03-10 NOTE — ED Triage Notes (Signed)
Pt endorses sharp cramping pain in upper abdomen with accompanying nausea and need to defecate around 4:30pm today- pt went to the restroom and had normal BM and mild relief of symptoms. Pt sts pain has been intermittent in nature lasting a few minutes. Pt endorses mild nausea. Pt denies cp, shob, dizziness, blurry vision, vaginal complaints, dysuria.

## 2017-03-10 NOTE — ED Provider Notes (Signed)
[facility] Provider Note   CSN: [id number] Arrival date & time: 03/10/17  1833     History   Chief Complaint Chief Complaint  Patient presents with  . Abdominal Pain    HPI ESSICA KIKER is a 66 y.o. female.  Patient presents to the emergency department with chief complaint of gastric abdominal pain.  She states the pain started this evening.  She reports it is sharp and stabbing.  She states the pain was severe.  She reports that over the past half hour to 45 minutes, she has had complete resolution of her symptoms.  She denies being in any pain now.  She denies any fevers or chills, dysuria, vaginal discharge.  She states that she did have some nausea and some dry heaving, but no actual vomiting.  She reports normal bowel movements.  Denies seeing any blood in her stools.  Prior abdominal surgeries include abdominal hysterectomy and hernia repair.  She has not taken anything for symptoms.  She denies any postprandial pain.  She denies any other associated symptoms   The history is provided by the patient. No language interpreter was used.    Past Medical History:  Diagnosis Date  . Bladder tumor    BENIGN  . Claustrophobia   . Elevated liver function tests 2016  . GERD (gastroesophageal reflux disease)   . Hiatal hernia 05/29/15   CT scan of abdomen and pelvis  . Sigmoid diverticulitis 05/29/15   CT scan of abdomen and pelvis  . Urinary urgency     Patient Active Problem List   Diagnosis Date Noted  . Anemia due to acute blood loss 06/03/2014  . Status post Nissen fundoplication and repair of large hiatal hernia Jan 2016 05/30/2014    Past Surgical History:  Procedure Laterality Date  . ABDOMINAL HYSTERECTOMY    . ABDOMINAL SACROCOLPOPEXY N/A 11/06/2013   Procedure: ABDOMINO SACROCOLPOPEXY with Halban's culdoplasty;  Surgeon: Jamey Reas de Berton Lan, MD;  Location: [facility];  Service: Gynecology;  Laterality: N/A;   . ANTERIOR AND POSTERIOR REPAIR N/A 11/06/2013   Procedure: ANTERIOR (CYSTOCELE) AND POSTERIOR REPAIR (RECTOCELE);  Surgeon: Jamey Reas de Berton Lan, MD;  Location: [facility];  Service: Gynecology;  Laterality: N/A;  . ANTERIOR CRUCIATE LIGAMENT REPAIR Right   . BLADDER SUSPENSION N/A 11/06/2013   Procedure: TRANSVAGINAL TAPE (TVT) PROCEDURE exact midurethral sling;  Surgeon: Everardo All Amundson de Berton Lan, MD;  Location: [facility];  Service: Gynecology;  Laterality: N/A;  . BLADDER TUMOR EXCISION  1992   benign  . CYSTOSCOPY N/A 11/06/2013   Procedure: CYSTOSCOPY;  Surgeon: Jamey Reas de Berton Lan, MD;  Location: [facility];  Service: Gynecology;  Laterality: N/A;  . HIATAL HERNIA REPAIR N/A 05/27/2014   Procedure: LAPAROSCOPIC REPAIR OF HIATAL HERNIA WITH MESH;  Surgeon: Pedro Earls, MD;  Location: [facility];  Service: General;  Laterality: N/A;  . INSERTION OF MESH  05/27/2014   Procedure: INSERTION OF MESH;  Surgeon: Pedro Earls, MD;  Location: [facility];  Service: General;;  two different mesh used for two different sites  . SPIGELIAN HERNIA Right 05/27/2014   Procedure: RIGHT SPIGELIAN HERNIA REPAIR WITH MESH;  Surgeon: Pedro Earls, MD;  Location: [facility];  Service: General;  Laterality: Right;  . TOTAL ABDOMINAL HYSTERECTOMY W/ BILATERAL SALPINGOOPHORECTOMY  06/15/04   dermoid and AUB    OB History    Gravida Para Term Preterm  AB Living   3 3 3     3    SAB TAB Ectopic Multiple Live Births                   Home Medications    Prior to Admission medications   Medication Sig Start Date End Date Taking? Authorizing Provider  acetaminophen (TYLENOL) 500 MG tablet Take 1,000 mg by mouth every 6 (six) hours as needed for fever (fever).    [provider]  cholecalciferol (VITAMIN D) 1000 UNITS tablet Take 2,000 Units by mouth daily.     [provider]  estradiol (MINIVELLE) 0.025 MG/24HR Place 1 patch onto the skin 2 (two) times a week.  04/08/16   Kem Boroughs, FNP  Multiple Vitamin (MULTIVITAMIN) tablet Take 1 tablet by mouth daily.    [provider]  Probiotic Product ([place]oar) 170 MG CAPS Take 1 capsule by mouth daily.    [provider]  ranitidine (ZANTAC) 150 MG capsule Take 150 mg by mouth as needed for heartburn.    [provider]    Family History Family History  Problem Relation Age of Onset  . Cancer Maternal Grandfather     Social History Social History  Substance Use Topics  . Smoking status: Former Smoker    Types: Cigarettes    Quit date: 04/27/1973  . Smokeless tobacco: Never Used  . Alcohol use 8.4 oz/week    14 Glasses of wine per week     Comment: 10-14 glasses of wine a week     Allergies   Sulfa antibiotics   Review of Systems Review of Systems  All other systems reviewed and are negative.    Physical Exam Updated Vital Signs BP (!) 160/70   Pulse 76   Temp (!) 97.4 F (36.3 C) (Oral)   Resp 20   Ht 5\' 3"  (1.6 m)   Wt 72.6 kg (160 lb)   LMP 06/15/2004 (Approximate)   SpO2 100%   BMI 28.34 kg/m   Physical Exam  Constitutional: She is oriented to person, place, and time. She appears well-developed and well-nourished.  HENT:  Head: Normocephalic and atraumatic.  Eyes: Pupils are equal, round, and reactive to light. Conjunctivae and EOM are normal.  Neck: Normal range of motion. Neck supple.  Cardiovascular: Normal rate and regular rhythm.  Exam reveals no gallop and no friction rub.   No murmur heard. Pulmonary/Chest: Effort normal and breath sounds normal. No respiratory distress. She has no wheezes. She has no rales. She exhibits no tenderness.  Abdominal: Soft. Bowel sounds are normal. She exhibits no distension and no mass. There is no tenderness. There is no rebound and no guarding.  No focal abdominal tenderness, no RLQ tenderness or pain at McBurney's point, no RUQ tenderness or Murphy's sign, no left-sided abdominal  tenderness, no fluid wave, or signs of peritonitis   Musculoskeletal: Normal range of motion. She exhibits no edema or tenderness.  Neurological: She is alert and oriented to person, place, and time.  Skin: Skin is warm and dry.  Psychiatric: She has a normal mood and affect. Her behavior is normal. Judgment and thought content normal.  Nursing note and vitals reviewed.    ED Treatments / Results  Labs (all labs ordered are listed, but only abnormal results are displayed) Labs Reviewed  COMPREHENSIVE METABOLIC PANEL - Abnormal; Notable for the following:       Result Value   Chloride 97 (*)    Glucose, Bld  110 (*)    All other components within normal limits  CBC - Abnormal; Notable for the following:    WBC 15.0 (*)    Hemoglobin 15.8 (*)    HCT 46.4 (*)    All other components within normal limits  URINALYSIS, ROUTINE W REFLEX MICROSCOPIC - Abnormal; Notable for the following:    Hgb urine dipstick SMALL (*)    Ketones, ur 20 (*)    Squamous Epithelial / LPF 0-5 (*)    All other components within normal limits  TROPONIN I - Abnormal; Notable for the following:    Troponin I 0.06 (*)    All other components within normal limits  LIPASE, BLOOD    EKG  EKG Interpretation  Date/Time:  Friday March 11 2017 01:00:08 EDT Ventricular Rate:  65 PR Interval:    QRS Duration: 109 QT Interval:  472 QTC Calculation: 491 R Axis:   73 Text Interpretation:  Sinus rhythm RSR' in V1 or V2, probably normal variant Confirmed by Randal Buba, April ([id number]) on 03/11/2017 1:03:31 AM       Radiology Dg Chest Port 1 View  Result Date: 03/11/2017 CLINICAL DATA:  Acute onset of sharp cramping upper abdominal pain and nausea. Initial encounter. EXAM: PORTABLE CHEST 1 VIEW COMPARISON:  Chest radiograph performed 05/21/2013 FINDINGS: The lungs are well-aerated and clear. There is no evidence of focal opacification, pleural effusion or pneumothorax. The cardiomediastinal silhouette is within  normal limits. No acute osseous abnormalities are seen. IMPRESSION: No acute cardiopulmonary process seen. Electronically Signed   By: Garald Balding M.D.   On: 03/11/2017 01:07    Procedures Procedures (including critical care time)  Medications Ordered in ED Medications  ondansetron (ZOFRAN-ODT) 4 MG disintegrating tablet (not administered)  ondansetron (ZOFRAN-ODT) disintegrating tablet 4 mg (4 mg Oral Given 03/10/17 1848)     Initial Impression / Assessment and Plan / ED Course  I have reviewed the triage vital signs and the nursing notes.  Pertinent labs & imaging results that were available during my care of the patient were reviewed by me and considered in my medical decision making (see chart for details).     Patient with epigastric abdominal pain that started this evening.  Patient has no pain now.  Denies any fevers chills.  She reports that she did have some nausea, but no vomiting.  She has no focal abdominal tenderness.  Laboratory workup was fairly reassuring.  She does have a nonspecific leukocytosis to 15.  LFTs and lipase are normal.  Doubt gallbladder disease or pancreatitis.  She has no lower abdominal tenderness, doubt appendicitis or diverticulitis.  Patient does have a history of GERD, symptoms could be related to this or peptic ulcer.    Troponin is elevated. EKG shows no ischemic findings.  Discussed with Dr. Radford Pax from cardiology, who recommends admission to medicine.  Final Clinical Impressions(s) / ED Diagnoses   Final diagnoses:  Epigastric pain  Elevated troponin    New Prescriptions New Prescriptions   No medications on file     Delaine Lame 03/11/17 0202    Noemi Chapel, MD 03/13/17 [phone]

## 2017-03-11 ENCOUNTER — Observation Stay (HOSPITAL_BASED_OUTPATIENT_CLINIC_OR_DEPARTMENT_OTHER): Payer: 59

## 2017-03-11 ENCOUNTER — Observation Stay (HOSPITAL_COMMUNITY): Payer: 59

## 2017-03-11 ENCOUNTER — Emergency Department (HOSPITAL_COMMUNITY): Payer: 59

## 2017-03-11 ENCOUNTER — Encounter (HOSPITAL_COMMUNITY): Payer: Self-pay | Admitting: Internal Medicine

## 2017-03-11 DIAGNOSIS — I340 Nonrheumatic mitral (valve) insufficiency: Secondary | ICD-10-CM | POA: Diagnosis not present

## 2017-03-11 DIAGNOSIS — R748 Abnormal levels of other serum enzymes: Secondary | ICD-10-CM | POA: Diagnosis not present

## 2017-03-11 DIAGNOSIS — R7989 Other specified abnormal findings of blood chemistry: Secondary | ICD-10-CM

## 2017-03-11 DIAGNOSIS — R778 Other specified abnormalities of plasma proteins: Secondary | ICD-10-CM | POA: Diagnosis not present

## 2017-03-11 DIAGNOSIS — R1013 Epigastric pain: Secondary | ICD-10-CM | POA: Diagnosis present

## 2017-03-11 DIAGNOSIS — R030 Elevated blood-pressure reading, without diagnosis of hypertension: Secondary | ICD-10-CM

## 2017-03-11 DIAGNOSIS — R079 Chest pain, unspecified: Secondary | ICD-10-CM | POA: Diagnosis not present

## 2017-03-11 LAB — HEPATIC FUNCTION PANEL
ALBUMIN: 3.9 g/dL (ref 3.5–5.0)
ALK PHOS: 68 U/L (ref 38–126)
ALT: 21 U/L (ref 14–54)
AST: 22 U/L (ref 15–41)
BILIRUBIN TOTAL: 1.2 mg/dL (ref 0.3–1.2)
Bilirubin, Direct: 0.1 mg/dL (ref 0.1–0.5)
Indirect Bilirubin: 1.1 mg/dL — ABNORMAL HIGH (ref 0.3–0.9)
Total Protein: 6.7 g/dL (ref 6.5–8.1)

## 2017-03-11 LAB — LIPID PANEL
CHOLESTEROL: 219 mg/dL — AB (ref 0–200)
HDL: 64 mg/dL (ref >40)
LDL Cholesterol: 138 mg/dL — ABNORMAL HIGH (ref 0–99)
TRIGLYCERIDES: 87 mg/dL (ref <150)
Total CHOL/HDL Ratio: 3.4 RATIO
VLDL: 17 mg/dL (ref 0–40)

## 2017-03-11 LAB — TROPONIN I
Troponin I: 0.06 ng/mL (ref <0.03)
Troponin I: 0.05 ng/mL (ref <0.03)
Troponin I: 0.04 ng/mL (ref <0.03)

## 2017-03-11 LAB — CBC
HCT: 43.4 % (ref 36.0–46.0)
HEMOGLOBIN: 14.4 g/dL (ref 12.0–15.0)
MCH: 32.4 pg (ref 26.0–34.0)
MCHC: 33.2 g/dL (ref 30.0–36.0)
MCV: 97.7 fL (ref 78.0–100.0)
Platelets: 312 10*3/uL (ref 150–400)
RBC: 4.44 MIL/uL (ref 3.87–5.11)
RDW: 12.6 % (ref 11.5–15.5)
WBC: 9.6 10*3/uL (ref 4.0–10.5)

## 2017-03-11 LAB — LIPASE, BLOOD: Lipase: 17 U/L (ref 11–51)

## 2017-03-11 LAB — ECHOCARDIOGRAM COMPLETE
Height: 63 in
Weight: 2560 oz

## 2017-03-11 LAB — CREATININE, SERUM
Creatinine, Ser: 0.65 mg/dL (ref 0.44–1.00)
GFR calc Af Amer: >60 mL/min (ref >60)
GFR calc non Af Amer: >60 mL/min (ref >60)

## 2017-03-11 LAB — HEMOGLOBIN A1C
HEMOGLOBIN A1C: 5.4 % (ref 4.8–5.6)
MEAN PLASMA GLUCOSE: 108.28 mg/dL

## 2017-03-11 MED ORDER — NITROGLYCERIN 0.4 MG SL SUBL
0.4000 mg | SUBLINGUAL_TABLET | Freq: Once | SUBLINGUAL | Status: AC
  Administered 2017-03-11: 0.4 mg via SUBLINGUAL

## 2017-03-11 MED ORDER — ACETAMINOPHEN 325 MG PO TABS: 650.0000 mg | ORAL_TABLET | ORAL | Status: DC | PRN

## 2017-03-11 MED ORDER — ENOXAPARIN SODIUM 40 MG/0.4ML ~~LOC~~ SOLN
40.0000 mg | Freq: Every day | SUBCUTANEOUS | Status: DC
  Filled 2017-03-11: qty 0.4

## 2017-03-11 MED ORDER — ONDANSETRON HCL 4 MG/2ML IJ SOLN: 4.0000 mg | Freq: Four times a day (QID) | INTRAMUSCULAR | Status: DC | PRN

## 2017-03-11 MED ORDER — METOPROLOL TARTRATE 5 MG/5ML IV SOLN
INTRAVENOUS | Status: AC
  Filled 2017-03-11: qty 5

## 2017-03-11 MED ORDER — ASPIRIN EC 325 MG PO TBEC: 325.0000 mg | DELAYED_RELEASE_TABLET | Freq: Every day | ORAL | Status: DC

## 2017-03-11 MED ORDER — NITROGLYCERIN 0.4 MG SL SUBL
SUBLINGUAL_TABLET | SUBLINGUAL | Status: AC
  Filled 2017-03-11: qty 1

## 2017-03-11 MED ORDER — IOPAMIDOL (ISOVUE-370) INJECTION 76%
INTRAVENOUS | Status: AC
  Administered 2017-03-11: 100 mL
  Filled 2017-03-11: qty 100

## 2017-03-11 MED ORDER — METOPROLOL TARTRATE 5 MG/5ML IV SOLN
5.0000 mg | Freq: Once | INTRAVENOUS | Status: AC
  Administered 2017-03-11: 5 mg via INTRAVENOUS

## 2017-03-11 NOTE — Progress Notes (Signed)
  Echocardiogram 2D Echocardiogram has been performed.  Johny Chess 03/11/2017, 11:40 AM

## 2017-03-11 NOTE — H&P (Signed)
History and Physical    ARTEMISA SLADEK NGE:[id number] DOB: [birth year] DOA: 03/10/2017  PCP: Alroy Dust, L.Marlou Sa, MD  Patient coming from: Home.  Chief Complaint: Epigastric pain.  HPI: [name] is a 66 y.o. female with no significant past medical history presents to the ER with complaints of severe epigastric pain.  Last evening patient was getting ready to arrange engagement ceremony for her son when patient started developing acute severe epigastric pain radiating to her back.  No associated nausea vomiting diaphoresis chest pain shortness of breath or diarrhea.  Pain lasted for couple of hours and patient decided to come to the ER.  ED Course: By the time patient came to the ER.  Patient's pain resolved.  EKG was showing normal sinus rhythm chest x-ray unremarkable.  Lipase and LFTs were normal.  Troponin came positive and cardiology was notified.  Patient admitted for further trending of troponin and further workup.  On my exam abdomen appears benign.  Patient does not have any chest pain.  Review of Systems: As per HPI, rest all negative.   Past Medical History:  Diagnosis Date  . Bladder tumor    BENIGN  . Claustrophobia   . Elevated liver function tests 2016  . GERD (gastroesophageal reflux disease)   . Hiatal hernia 05/29/15   CT scan of abdomen and pelvis  . Sigmoid diverticulitis 05/29/15   CT scan of abdomen and pelvis  . Urinary urgency     Past Surgical History:  Procedure Laterality Date  . ABDOMINAL HYSTERECTOMY    . ABDOMINAL SACROCOLPOPEXY N/A 11/06/2013   Procedure: ABDOMINO SACROCOLPOPEXY with Halban's culdoplasty;  Surgeon: Jamey Reas de Berton Lan, MD;  Location: [facility];  Service: Gynecology;  Laterality: N/A;  . ANTERIOR AND POSTERIOR REPAIR N/A 11/06/2013   Procedure: ANTERIOR (CYSTOCELE) AND POSTERIOR REPAIR (RECTOCELE);  Surgeon: Jamey Reas de Berton Lan, MD;  Location: [facility];  Service: Gynecology;  Laterality: N/A;  .  ANTERIOR CRUCIATE LIGAMENT REPAIR Right   . BLADDER SUSPENSION N/A 11/06/2013   Procedure: TRANSVAGINAL TAPE (TVT) PROCEDURE exact midurethral sling;  Surgeon: Everardo All Amundson de Berton Lan, MD;  Location: [facility];  Service: Gynecology;  Laterality: N/A;  . BLADDER TUMOR EXCISION  1992   benign  . CYSTOSCOPY N/A 11/06/2013   Procedure: CYSTOSCOPY;  Surgeon: Jamey Reas de Berton Lan, MD;  Location: [facility];  Service: Gynecology;  Laterality: N/A;  . HIATAL HERNIA REPAIR N/A 05/27/2014   Procedure: LAPAROSCOPIC REPAIR OF HIATAL HERNIA WITH MESH;  Surgeon: Pedro Earls, MD;  Location: [facility];  Service: General;  Laterality: N/A;  . INSERTION OF MESH  05/27/2014   Procedure: INSERTION OF MESH;  Surgeon: Pedro Earls, MD;  Location: [facility];  Service: General;;  two different mesh used for two different sites  . SPIGELIAN HERNIA Right 05/27/2014   Procedure: RIGHT SPIGELIAN HERNIA REPAIR WITH MESH;  Surgeon: Pedro Earls, MD;  Location: [facility];  Service: General;  Laterality: Right;  . TOTAL ABDOMINAL HYSTERECTOMY W/ BILATERAL SALPINGOOPHORECTOMY  06/15/04   dermoid and AUB     reports that she quit smoking about 43 years ago. Her smoking use included Cigarettes. She has never used smokeless tobacco. She reports that she drinks about 8.4 oz of alcohol per week . She reports that she does not use drugs.  Allergies  Allergen Reactions  . Sulfa Antibiotics Hives    Family History  Problem Relation Age of  Onset  . Cancer Maternal Grandfather     Prior to Admission medications   Medication Sig Start Date End Date Taking? Authorizing Provider  acetaminophen (TYLENOL) 500 MG tablet Take 1,000 mg by mouth every 6 (six) hours as needed for fever (fever).   Yes [provider]  cholecalciferol (VITAMIN D) 1000 UNITS tablet Take 2,000 Units by mouth daily.    Yes [provider]  estradiol (MINIVELLE) 0.025 MG/24HR Place 1 patch onto the skin 2 (two) times a  week. 04/08/16  Yes Kem Boroughs, FNP  Probiotic Product ([place]) 170 MG CAPS Take 1 capsule by mouth daily as needed (stomach issues).    Yes [provider]    Physical Exam: Vitals:   03/11/17 0302 03/11/17 0330 03/11/17 0400 03/11/17 0430  BP: (!) 152/84 (!) 144/80 (!) 142/71 136/64  Pulse: 69 67 65 67  Resp: 16 17 18 15   Temp:      TempSrc:      SpO2: 99% 97% 98% 98%  Weight:      Height:          Constitutional: Moderately built and nourished. Vitals:   03/11/17 0302 03/11/17 0330 03/11/17 0400 03/11/17 0430  BP: (!) 152/84 (!) 144/80 (!) 142/71 136/64  Pulse: 69 67 65 67  Resp: 16 17 18 15   Temp:      TempSrc:      SpO2: 99% 97% 98% 98%  Weight:      Height:       Eyes: Anicteric no pallor. ENMT: No discharge from the ears eyes nose or mouth. Neck: No mass felt.  No neck rigidity.  No JVD appreciated. Respiratory: No rhonchi or crepitations. Cardiovascular: S1-S2 heard no murmurs appreciated. Abdomen: Soft nontender bowel sounds present. Musculoskeletal: No edema.  No joint effusion. Skin: No rash.  Skin appears warm. Neurologic: Alert awake oriented to time place and person.  Moves all extremities. Psychiatric: Appears normal.  Normal affect.   Labs on Admission: I have personally reviewed following labs and imaging studies  CBC:  Recent Labs Lab 03/10/17 1842  WBC 15.0*  HGB 15.8*  HCT 46.4*  MCV 98.3  PLT 562   Basic Metabolic Panel:  Recent Labs Lab 03/10/17 1842  NA 135  K 4.9  CL 97*  CO2 24  GLUCOSE 110*  BUN 15  CREATININE 0.76  CALCIUM 9.7   GFR: Estimated Creatinine Clearance: 67.9 mL/min (by C-G formula based on SCr of 0.76 mg/dL). Liver Function Tests:  Recent Labs Lab 03/10/17 1842  AST 30  ALT 25  ALKPHOS 84  BILITOT 1.0  PROT 7.8  ALBUMIN 4.7    Recent Labs Lab 03/10/17 1842  LIPASE 21   No results for input(s): AMMONIA in the last 168 hours. Coagulation Profile: No results  for input(s): INR, PROTIME in the last 168 hours. Cardiac Enzymes:  Recent Labs Lab 03/10/17 2323  TROPONINI 0.06*   BNP (last 3 results) No results for input(s): PROBNP in the last 8760 hours. HbA1C: No results for input(s): HGBA1C in the last 72 hours. CBG: No results for input(s): GLUCAP in the last 168 hours. Lipid Profile: No results for input(s): CHOL, HDL, LDLCALC, TRIG, CHOLHDL, LDLDIRECT in the last 72 hours. Thyroid Function Tests: No results for input(s): TSH, T4TOTAL, FREET4, T3FREE, THYROIDAB in the last 72 hours. Anemia Panel: No results for input(s): VITAMINB12, FOLATE, FERRITIN, TIBC, IRON, RETICCTPCT in the last 72 hours. Urine analysis:    Component Value Date/Time  COLORURINE YELLOW 03/10/2017 1910   APPEARANCEUR CLEAR 03/10/2017 1910   LABSPEC 1.020 03/10/2017 1910   PHURINE 5.0 03/10/2017 1910   GLUCOSEU NEGATIVE 03/10/2017 1910   HGBUR SMALL (A) 03/10/2017 1910   BILIRUBINUR NEGATIVE 03/10/2017 1910   BILIRUBINUR neg 08/20/2014 1429   KETONESUR 20 (A) 03/10/2017 1910   PROTEINUR NEGATIVE 03/10/2017 1910   UROBILINOGEN negative 08/20/2014 1429   UROBILINOGEN 0.2 09/16/2013 1325   NITRITE NEGATIVE 03/10/2017 1910   LEUKOCYTESUR NEGATIVE 03/10/2017 1910   Sepsis Labs: @LABRCNTIP (procalcitonin:4,lacticidven:4) )No results found for this or any previous visit (from the past 240 hour(s)).   Radiological Exams on Admission: Dg Chest Port 1 View  Result Date: 03/11/2017 CLINICAL DATA:  Acute onset of sharp cramping upper abdominal pain and nausea. Initial encounter. EXAM: PORTABLE CHEST 1 VIEW COMPARISON:  Chest radiograph performed 05/21/2013 FINDINGS: The lungs are well-aerated and clear. There is no evidence of focal opacification, pleural effusion or pneumothorax. The cardiomediastinal silhouette is within normal limits. No acute osseous abnormalities are seen. IMPRESSION: No acute cardiopulmonary process seen. Electronically Signed   By: Garald Balding M.D.   On: 03/11/2017 01:07    EKG: Independently reviewed.  Normal sinus rhythm.  Assessment/Plan Principal Problem:   Epigastric pain Active Problems:   Elevated troponin    1. Epigastric pain with positive troponin -we will cycle cardiac markers keep patient n.p.o. check 2D echo aspirin.  Recheck lipase and LFTs.  Cardiology consulted. 2. Patient drinks wine every day closely monitor for any withdrawal. 3. Elevated blood pressure on presentation -follow blood pressure trend.   DVT prophylaxis: Lovenox. Code Status: Full code. Family Communication: Discussed with patient. Disposition Plan: Home. Consults called: Cardiology. Admission status: Observation.   Rise Patience MD [facility] Pager [phone].  If 7PM-7AM, please contact night-coverage [email or web address] Password [facility]  03/11/2017, 4:34 AM

## 2017-03-11 NOTE — Consult Note (Signed)
Cardiology Consultation:   Patient ID: [name]; [id number]; 04/15/1951   Admit date: 03/10/2017 Date of Consult: 03/11/2017  Primary Care Provider: Alroy Dust, Carlean Jews.Marlou Sa, MD Primary Cardiologist: new Primary Electrophysiologist:  NA   Patient Profile:   [name] is a 66 y.o. female with a hx of bladder tumor, hiatal hernia with repair and GERD but no cardiac hx who is being seen today for the evaluation of chest pain with mildly elevated troponin at the request of Dr. Cephus Slater.   History of Present Illness:   [name] a hx of bladder tumor, hiatal hernia and GERD but no cardiac hx who is being seen today for the evaluation of chest pain with mildly elevated troponin.  She was in her ususal state of health until 03/10/17 evening when she developed cramping pain in upper abd 10/10 epigastric area no associated symptoms at that time but the pain did not resolve.  The pain did radiate to her back.  No SOB.  Pain lasted a couple of hours and she then presented to ER.   Here in ER after blood drawn she did become nauseated and need for BM.  After BM she felt better but continued with mild nausea.  No SOB.  Prior to this she had played tennis this week a 3 hour match.  No chest pain and no SOB.  This episode occurred after cleaning the house.    FH with her father died at 65 with sudden death in his sleep.    EKG SR no acute changes from 2015 I personally reviewed all EKGs TELE: personally reviewed SR   Troponin 0.06, 0.05  K+ 4.9, Cr 0.76, Lipase 21, AST 30  WBC on admit 15 now 9.6 hgb 15.8  PCXR no acute cardiopulmonmary process seen  Currently pain free and has been since last evening.   She is NPO.   Past Medical History:  Diagnosis Date  . Bladder tumor    BENIGN  . Claustrophobia   . Elevated liver function tests 2016  . GERD (gastroesophageal reflux disease)   . Hiatal hernia 05/29/15   CT scan of abdomen and pelvis  . Sigmoid diverticulitis 05/29/15   CT scan of abdomen and pelvis  . Urinary urgency     Past Surgical History:  Procedure Laterality Date  . ABDOMINAL HYSTERECTOMY    . ABDOMINAL SACROCOLPOPEXY N/A 11/06/2013   Procedure: ABDOMINO SACROCOLPOPEXY with Halban's culdoplasty;  Surgeon: Jamey Reas de Berton Lan, MD;  Location: [facility];  Service: Gynecology;  Laterality: N/A;  . ANTERIOR AND POSTERIOR REPAIR N/A 11/06/2013   Procedure: ANTERIOR (CYSTOCELE) AND POSTERIOR REPAIR (RECTOCELE);  Surgeon: Jamey Reas de Berton Lan, MD;  Location: [facility];  Service: Gynecology;  Laterality: N/A;  . ANTERIOR CRUCIATE LIGAMENT REPAIR Right   . BLADDER SUSPENSION N/A 11/06/2013   Procedure: TRANSVAGINAL TAPE (TVT) PROCEDURE exact midurethral sling;  Surgeon: Everardo All Amundson de Berton Lan, MD;  Location: [facility];  Service: Gynecology;  Laterality: N/A;  . BLADDER TUMOR EXCISION  1992   benign  . CYSTOSCOPY N/A 11/06/2013   Procedure: CYSTOSCOPY;  Surgeon: Jamey Reas de Berton Lan, MD;  Location: [facility];  Service: Gynecology;  Laterality: N/A;  . HIATAL HERNIA REPAIR N/A 05/27/2014   Procedure: LAPAROSCOPIC REPAIR OF HIATAL HERNIA WITH MESH;  Surgeon: Pedro Earls, MD;  Location: [facility];  Service: General;  Laterality: N/A;  . INSERTION OF MESH  05/27/2014  Procedure: INSERTION OF MESH;  Surgeon: Pedro Earls, MD;  Location: [facility];  Service: General;;  two different mesh used for two different sites  . SPIGELIAN HERNIA Right 05/27/2014   Procedure: RIGHT SPIGELIAN HERNIA REPAIR WITH MESH;  Surgeon: Pedro Earls, MD;  Location: [facility];  Service: General;  Laterality: Right;  . TOTAL ABDOMINAL HYSTERECTOMY W/ BILATERAL SALPINGOOPHORECTOMY  06/15/04   dermoid and AUB     Home Medications:  Prior to Admission medications   Medication Sig Start Date End Date Taking? Authorizing Provider  acetaminophen (TYLENOL) 500 MG tablet Take 1,000 mg by mouth every 6 (six) hours as needed for fever (fever).    Yes [provider]  cholecalciferol (VITAMIN D) 1000 UNITS tablet Take 2,000 Units by mouth daily.    Yes [provider]  estradiol (MINIVELLE) 0.025 MG/24HR Place 1 patch onto the skin 2 (two) times a week. 04/08/16  Yes Kem Boroughs, FNP  Probiotic Product ([place]) 170 MG CAPS Take 1 capsule by mouth daily as needed (stomach issues).    Yes [provider]    Inpatient Medications: Scheduled Meds: . aspirin EC  325 mg Oral Daily  . enoxaparin (LOVENOX) injection  40 mg Subcutaneous Daily   Continuous Infusions:  PRN Meds: acetaminophen, ondansetron (ZOFRAN) IV  Allergies:    Allergies  Allergen Reactions  . Sulfa Antibiotics Hives    Social History:   Social History   Social History  . Marital status: Married    Spouse name: N/A  . Number of children: 3  . Years of education: N/A   Occupational History  . Not on file.   Social History Main Topics  . Smoking status: Former Smoker    Types: Cigarettes    Quit date: 04/27/1973  . Smokeless tobacco: Never Used  . Alcohol use 8.4 oz/week    14 Glasses of wine per week     Comment: 10-14 glasses of wine a week  . Drug use: No  . Sexual activity: Yes    Partners: Male    Birth control/ protection: Surgical     Comment: vasectomy/TAH   Other Topics Concern  . Not on file   Social History Narrative  . No narrative on file    Family History:    Family History  Problem Relation Age of Onset  . Sudden Cardiac Death Father 24       dued in his sleep, unkown cause  . Cancer Maternal Grandfather      ROS:  Please see the history of present illness.  ROS  General:no colds or fevers, no weight changes Skin:no rashes or ulcers HEENT:no blurred vision, no congestion CV:see HPI PUL:see HPI GI:no diarrhea constipation or melena, no indigestion GU:no hematuria, no dysuria MS:no joint pain, no claudication Neuro:no syncope, no lightheadedness Endo:no diabetes, no  thyroid disease   Physical Exam/Data:   Vitals:   03/11/17 0730 03/11/17 0800 03/11/17 0930 03/11/17 1000  BP: (!) 154/75 (!) 142/71 135/70 140/74  Pulse: 62 62 (!) 59 (!) 56  Resp: 17 16 11  (!) 24  Temp:      TempSrc:      SpO2: 99% 98% 97% 99%  Weight:      Height:       No intake or output data in the 24 hours ending 03/11/17 1040 Filed Weights   03/10/17 1842  Weight: 160 lb (72.6 kg)   Body mass index is 28.34 kg/m.  General:  Well nourished,  well developed, in no acute distress HEENT: normal Lymph: no adenopathy Neck: no JVD Endocrine:  No thryomegaly Vascular: No carotid bruits; 2+ pedal pulses   Cardiac:  normal S1, S2; RRR; no murmur, gallup rub or click  Lungs:  clear to auscultation bilaterally, no wheezing, rhonchi or rales  Abd: soft, nontender, no hepatomegaly  Ext: no edema Musculoskeletal:  No deformities, BUE and BLE strength normal and equal Skin: warm and dry  Neuro:  Alert and oriented X 3 MAE follows commands, + facial symmetry, no focal abnormalities noted Psych:  Normal affect    Relevant CV Studies: No prior studies  Laboratory Data:  Chemistry  Recent Labs Lab 03/10/17 1842 03/11/17 0505  NA 135  --   K 4.9  --   CL 97*  --   CO2 24  --   GLUCOSE 110*  --   BUN 15  --   CREATININE 0.76 0.65  CALCIUM 9.7  --   GFRNONAA >60 >60  GFRAA >60 >60  ANIONGAP 14  --      Recent Labs Lab 03/10/17 1842 03/11/17 0505  PROT 7.8 6.7  ALBUMIN 4.7 3.9  AST 30 22  ALT 25 21  ALKPHOS 84 68  BILITOT 1.0 1.2   Hematology  Recent Labs Lab 03/10/17 1842 03/11/17 0505  WBC 15.0* 9.6  RBC 4.72 4.44  HGB 15.8* 14.4  HCT 46.4* 43.4  MCV 98.3 97.7  MCH 33.5 32.4  MCHC 34.1 33.2  RDW 12.6 12.6  PLT 369 312   Cardiac Enzymes  Recent Labs Lab 03/10/17 2323 03/11/17 0505  TROPONINI 0.06* 0.05*   No results for input(s): TROPIPOC in the last 168 hours.  BNPNo results for input(s): BNP, PROBNP in the last 168 hours.  DDimer No  results for input(s): DDIMER in the last 168 hours.  Radiology/Studies:  Dg Chest Port 1 View  Result Date: 03/11/2017 CLINICAL DATA:  Acute onset of sharp cramping upper abdominal pain and nausea. Initial encounter. EXAM: PORTABLE CHEST 1 VIEW COMPARISON:  Chest radiograph performed 05/21/2013 FINDINGS: The lungs are well-aerated and clear. There is no evidence of focal opacification, pleural effusion or pneumothorax. The cardiomediastinal silhouette is within normal limits. No acute osseous abnormalities are seen. IMPRESSION: No acute cardiopulmonary process seen. Electronically Signed   By: Garald Balding M.D.   On: 03/11/2017 01:07    Assessment and Plan:   1. Epigastric pain - resolved now. Lipase WNL  HX of hiatal hernia with repair. 2. Elevated tropoins minimally EKG stable.  Possible ETT vs. Cardiac CT to eval for CAD.  Dr. Harrington Challenger to see. Echo has been ordered.  3. BP elevated at times no hx of HTN.    For questions or updates, please contact [place] Please consult [email or web address] for contact info under Cardiology/STEMI.   Signed, Cecilie Kicks, NP  03/11/2017 10:40 AM   Pt seen and examiined  I agree with findings as noted above by LINgold Pt a 65 yo with no prior cardiac history  Last night developed subxiphoid pain  Wax and waine  Intense at times  Not associated with activity  NO sweats or SOB  Came to ED  Relieved after went to [facility]   Had a lot burping  Paion went away.  Never had before  Currently symptom free  Exam unremarkable   Lungs CTA  Cardiac RRR  No murmurs  No rub  Chest  nontender  Ext without edema   EKG without diagnositic ST changes  Labs signif for mild elevation in troponin (trivial)  I would not expect this in someone with no known cardiac issues or with signif HTN.  I would recomm cardiac CT angio to define anatomy    Dorris Carnes   Will continue to follow

## 2017-03-11 NOTE — ED Notes (Signed)
EDP aware of pts troponin

## 2017-03-11 NOTE — ED Notes (Signed)
Transported for echo

## 2017-03-11 NOTE — Discharge Summary (Signed)
Physician Discharge Summary  KIMAYA WHITLATCH KKX:[id number] DOB: [birth year] DOA: 03/10/2017  PCP: Alroy Dust, L.Marlou Sa, MD  Admit date: 03/10/2017 Discharge date: 03/11/2017  Time spent: over 30 minutes  Recommendations for Outpatient Follow-up:  1. Follow up outpatient CBC and BMP 2. Follow up with GI next week for asymmetric wall thickening or possible mass involving intrathoracic region of the stomach 3.  Normal A1c.  LDL 138.  Consider addition of statin in follow up 4.  Continue to monitor outpatient blood pressures.   Discharge Diagnoses:  Principal Problem:   Epigastric pain Active Problems:   Elevated troponin   Discharge Condition: stable  Diet recommendation: heart healthy  Filed Weights   03/10/17 1842  Weight: 72.6 kg (160 lb)    History of present illness:  [name] is [name] 66 y.o. female with no significant past medical history presents to the ER with complaints of severe epigastric pain.  Last evening patient was getting ready to arrange engagement ceremony for her son when patient started developing acute severe epigastric pain radiating to her back.  No associated nausea vomiting diaphoresis chest pain shortness of breath or diarrhea.  Pain lasted for couple of hours and patient decided to come to the ER.  She was noted to have Sundi Slevin mildly elevated troponin and cardiology was consulted.  Troponins downtrended.  Echo notable for normal EF 16-96%, grade 1 diastolic dysfunction, mild MV regurgitation and mildly dilated L atrium.  CT obtained and coronary artery calcium score was 0 suggesting low risk for future cardiac events.  Discussed with cardiology and felt with this result, ok for discharge.  F/u with cards prn.    Hospital Course:   1. Epigastric pain with positive troponin:  Suspect GI related as sx improved with belching.  EKG without concerning findings.  Troponins downtrended from 0.06.  Pt received ASA.  Echo obtained, as above. Cardiology was c/s  given elevated troponin and recommended Cardiac CT which was notable for coronary artery calcium score of 0.  Given low risk study, patient was discharged with plans for outpatient follow up.  2. Patient drinks wine every day closely monitor for any withdrawal. 3. Elevated blood pressure on presentation -follow blood pressure trend.   Procedures:  Echocardiogram Study Conclusions  - Left ventricle: The cavity size was normal. Wall thickness was   normal. Systolic function was normal. The estimated ejection   fraction was in the range of 55% to 60%. Wall motion was normal;   there were no regional wall motion abnormalities. Doppler   parameters are consistent with abnormal left ventricular   relaxation (grade 1 diastolic dysfunction). - Mitral valve: There was mild regurgitation. - Left atrium: The atrium was mildly dilated. Anterior-posterior   dimension: 44 mm.  Consultations:  cardiology  Discharge Exam: Vitals:   03/11/17 1500 03/11/17 1530  BP: 134/69 (!) 155/76  Pulse: (!) 58 66  Resp: 12 18  Temp:    SpO2: 98% 98%   Pain is resolved, feeling better.  No complaints.  No CP, no epigastric pain.  No n/v.   General: No acute distress. Cardiovascular: Heart sounds show Moosa Bueche regular rate, and rhythm. No gallops or rubs. No murmurs. No JVD. Lungs: Clear to auscultation bilaterally with good air movement. No rales, rhonchi or wheezes. Abdomen: Soft, nontender, nondistended with normal active bowel sounds. No masses. No hepatosplenomegaly. Neurological: Alert and oriented 3. Moves all extremities 4 with equal strength. Cranial nerves II through XII grossly intact. Skin: Warm and dry. No  rashes or lesions. Extremities: No clubbing or cyanosis. No edema. Pedal pulses 2+. Psychiatric: Mood and affect are normal. Insight and judgment are appropriate.  Discharge Instructions   Discharge Instructions    Call MD for:  difficulty breathing, headache or visual disturbances     Complete by:  As directed    Call MD for:  persistant dizziness or light-headedness    Complete by:  As directed    Call MD for:  persistant nausea and vomiting    Complete by:  As directed    Call MD for:  severe uncontrolled pain    Complete by:  As directed    Call MD for:  temperature >100.4    Complete by:  As directed    Diet - low sodium heart healthy    Complete by:  As directed    Diet - low sodium heart healthy    Complete by:  As directed    Discharge instructions    Complete by:  As directed    You were seen for abdominal pain.  Your heart enzyme was elevated (heart enzyme).  You were seen by cardiology and we did Phelicia Dantes CT which showed Aanyah Loa low coronary calcium score suggesting low risk for future cardiac events.  We think your pain was probably due to Bayyinah Dukeman gastrointestinal (stomach) cause since it seems to have improved with belching.  Please follow up with your PCP within 1 week and please follow up with Dr. Collene Mares your GI doctor.  The CT scan also had possible thickening of Jolynne Spurgin portion of the stomach and radiology is recommending an endoscopy or upper GI series to help further evaluate this.  I spoke to Dr. Collene Mares and she would like to see you to follow this up next Thursday at 1:30.   Increase activity slowly    Complete by:  As directed    Increase activity slowly    Complete by:  As directed      Discharge Medication List as of 03/11/2017  3:51 PM    CONTINUE these medications which have NOT CHANGED   Details  acetaminophen (TYLENOL) 500 MG tablet Take 1,000 mg by mouth every 6 (six) hours as needed for fever (fever)., Until Discontinued, Historical Med    cholecalciferol (VITAMIN D) 1000 UNITS tablet Take 2,000 Units by mouth daily. , Historical Med    estradiol (MINIVELLE) 0.025 MG/24HR Place 1 patch onto the skin 2 (two) times Kristoff Coonradt week., Starting Thu 04/08/2016, Normal    Probiotic Product ([place]) 170 MG CAPS Take 1 capsule by mouth daily as needed (stomach  issues). , Historical Med       Allergies  Allergen Reactions  . Sulfa Antibiotics Hives      The results of significant diagnostics from this hospitalization (including imaging, microbiology, ancillary and laboratory) are listed below for reference.    Significant Diagnostic Studies: Ct Coronary Morph W/cta Cor W/score W/ca W/cm &/or Wo/cm  Addendum Date: 03/11/2017   ADDENDUM REPORT: 03/11/2017 15:07 CLINICAL DATA:  Chest pain EXAM: Cardiac CTA MEDICATIONS: Sub lingual nitro. 4mg  and lopressor 5mg  IV TECHNIQUE: The patient was scanned on Adian Jablonowski Siemens 329 slice scanner. Gantry rotation speed was 240 msecs. Collimation was .6 mm. Abbeygail Igoe 100 kV prospective scan was triggered in the ascending thoracic aorta with 5% padding centered around 65-75% of the R-R interval. Average HR during the scan was 65 bpm. The 3D data set was interpreted on Victoria Henshaw dedicated work station using MPR, MIP and VRT modes. Lizzeth Meder  total of 80cc of contrast was used. FINDINGS: Non-cardiac: See separate report from [facility]. Calcium Score:  0 Agatston units. Coronary Arteries: Right dominant with no anomalies LM:  No plaque or stenosis. LAD system:  Moderate 1st diagonal with no significant disease. Circumflex system: Small ramus, no significant disease. No plaque or stenosis in the LCx. RCA system:  No plaque or stenosis. IMPRESSION: 1. Coronary artery calcium score 0 Agatston units, this suggests low risk for future cardiac events. 2.  No plaque or stenosis noted in the coronaries. Dalton Mclean Electronically Signed   By: Loralie Champagne M.D.   On: 03/11/2017 15:07   Result Date: 03/11/2017 EXAM: OVER-READ INTERPRETATION  CT CHEST The following report is an over-read performed by radiologist Dr. Evangeline Dakin of [facility], [place] on 03/11/2017. This over-read does not include interpretation of cardiac or coronary anatomy or pathology. The coronary calcium score/coronary CTA interpretation by the cardiologist is attached.  COMPARISON:  None. FINDINGS: Cardiovascular: Isolated calcified plaque involving the proximal abdominal aorta. No visible aortic atherosclerosis elsewhere. Mediastinum/Nodes: No pathologic lymphadenopathy involving the visualized mediastinum. Visualized esophagus normal in appearance. Lungs/Pleura: Visualized pulmonary parenchyma clear. No pleural effusions. Visualized central airways patent without significant bronchial wall thickening. Upper Abdomen: Small hiatal hernia. Asymmetric wall thickening or possible mass involving the intrathoracic portion of the stomach. Minimal perihepatic ascites. Visualized upper abdomen otherwise unremarkable. Musculoskeletal: Mild degenerative disc disease and spondylosis involving the visualized midthoracic spine. IMPRESSION: 1. Small hiatal hernia. Asymmetric wall thickening or possible mass involving the intrathoracic portion of the stomach. Upper endoscopy or upper GI series may be helpful in further evaluation to confirm or deny this finding. 2. Minimal perihepatic ascites. 3. No significant extracardiac findings otherwise. Electronically Signed: By: Evangeline Dakin M.D. On: 03/11/2017 14:53   Dg Chest Port 1 View  Result Date: 03/11/2017 CLINICAL DATA:  Acute onset of sharp cramping upper abdominal pain and nausea. Initial encounter. EXAM: PORTABLE CHEST 1 VIEW COMPARISON:  Chest radiograph performed 05/21/2013 FINDINGS: The lungs are well-aerated and clear. There is no evidence of focal opacification, pleural effusion or pneumothorax. The cardiomediastinal silhouette is within normal limits. No acute osseous abnormalities are seen. IMPRESSION: No acute cardiopulmonary process seen. Electronically Signed   By: Garald Balding M.D.   On: 03/11/2017 01:07    Microbiology: No results found for this or any previous visit (from the past 240 hour(s)).   Labs: Basic Metabolic Panel:  Recent Labs Lab 03/10/17 1842 03/11/17 0505  NA 135  --   K 4.9  --   CL 97*  --    CO2 24  --   GLUCOSE 110*  --   BUN 15  --   CREATININE 0.76 0.65  CALCIUM 9.7  --    Liver Function Tests:  Recent Labs Lab 03/10/17 1842 03/11/17 0505  AST 30 22  ALT 25 21  ALKPHOS 84 68  BILITOT 1.0 1.2  PROT 7.8 6.7  ALBUMIN 4.7 3.9    Recent Labs Lab 03/10/17 1842 03/11/17 0505  LIPASE 21 17   No results for input(s): AMMONIA in the last 168 hours. CBC:  Recent Labs Lab 03/10/17 1842 03/11/17 0505  WBC 15.0* 9.6  HGB 15.8* 14.4  HCT 46.4* 43.4  MCV 98.3 97.7  PLT 369 312   Cardiac Enzymes:  Recent Labs Lab 03/10/17 2323 03/11/17 0505 03/11/17 1143  TROPONINI 0.06* 0.05* 0.04*   BNP: BNP (last 3 results) No results for input(s): BNP in the last 8760 hours.  ProBNP (last 3 results) No results for input(s): PROBNP in the last 8760 hours.  CBG: No results for input(s): GLUCAP in the last 168 hours.     Signed:  Fayrene Helper MD.  [facility] 03/11/2017, 6:55 PM

## 2017-03-15 ENCOUNTER — Ambulatory Visit
Admission: RE | Admit: 2017-03-15 | Discharge: 2017-03-15 | Disposition: A | Discharge status: Home / Self Care | Payer: 59 | Source: Ambulatory Visit | Attending: Family Medicine | Admitting: Family Medicine

## 2017-03-15 ENCOUNTER — Ambulatory Visit
Admission: RE | Admit: 2017-03-15 | Discharge: 2017-03-15 | Disposition: A | Discharge status: Home / Self Care | Payer: 59 | Source: Ambulatory Visit | Attending: Obstetrics and Gynecology | Admitting: Obstetrics and Gynecology

## 2017-03-15 DIAGNOSIS — Z1231 Encounter for screening mammogram for malignant neoplasm of breast: Secondary | ICD-10-CM

## 2017-03-15 DIAGNOSIS — E2839 Other primary ovarian failure: Secondary | ICD-10-CM

## 2017-03-20 LAB — HIV 1/2 AB DIFFERENTIATION
HIV 1 AB: NEGATIVE
HIV 2 AB: NEGATIVE
Note: NEGATIVE

## 2017-03-20 LAB — RNA QUALITATIVE: HIV 1 RNA Qualitative: 1

## 2017-03-20 LAB — HIV ANTIBODY (ROUTINE TESTING W REFLEX): HIV SCREEN 4TH GENERATION: REACTIVE — AB

## 2017-03-21 DIAGNOSIS — K2970 Gastritis, unspecified, without bleeding: Secondary | ICD-10-CM

## 2017-03-21 HISTORY — DX: Gastritis, unspecified, without bleeding: K29.70

## 2017-03-23 ENCOUNTER — Encounter: Payer: Self-pay | Admitting: Obstetrics and Gynecology

## 2017-03-28 ENCOUNTER — Ambulatory Visit
Admission: RE | Admit: 2017-03-28 | Discharge: 2017-03-28 | Disposition: A | Discharge status: Home / Self Care | Payer: 59 | Source: Ambulatory Visit | Attending: Obstetrics and Gynecology | Admitting: Obstetrics and Gynecology

## 2017-03-30 ENCOUNTER — Encounter: Payer: Self-pay | Admitting: Obstetrics and Gynecology

## 2017-03-30 ENCOUNTER — Telehealth: Payer: Self-pay | Admitting: Obstetrics and Gynecology

## 2017-03-30 NOTE — Telephone Encounter (Signed)
Patient has questions about her bone density results and the supplements she needs to take.

## 2017-03-30 NOTE — Telephone Encounter (Signed)
Spoke with patient. Patient calling to clarify calcium and Vit D recommendations - see BMD 03/28/17 result note.   Patient states she has just recently restarted Vit D3 2000 IU daily as previously recommended by Kem Boroughs, NP, ok to continue this dose?   Advised patient Vit D recommendations have since changed, will update and review with Dr. Quincy Simmonds and return call, patient is agreeable.   Dr. Quincy Simmonds -Vit D 800 IUD recommended, started taking 2,000 IU daily recently, please advise?

## 2017-03-30 NOTE — Telephone Encounter (Signed)
Spoke with patient, advised as seen below per Dr. Quincy Simmonds. Patient verbalizes understanding and is agreeable. Will close encounter.

## 2017-03-30 NOTE — Telephone Encounter (Signed)
Ok if she is taking vit D 2000 IU daily.  It is difficult to take too much vit D with over the counter Rx.  I will be happy to measure this at her next office visit!

## 2017-04-14 ENCOUNTER — Other Ambulatory Visit: Payer: Self-pay

## 2017-04-14 NOTE — Telephone Encounter (Signed)
Medication refill request: MINIVELLE 0.025 MG Last AEX:  04/07/16 PG Next AEX: 05/23/17 BS Last MMG (if hormonal medication request): 03/15/17 BIRADS 1 negative/density b Refill authorized: 04/08/16 #24 w/4 refills; today please advise, Dr. Quincy Simmonds out of office.

## 2017-04-15 ENCOUNTER — Ambulatory Visit: Payer: 59 | Admitting: Nurse Practitioner

## 2017-04-15 MED ORDER — ESTRADIOL 0.025 MG/24HR TD PTTW: 1.0000 | MEDICATED_PATCH | TRANSDERMAL | 0 refills | Status: DC

## 2017-05-23 ENCOUNTER — Ambulatory Visit: Payer: 59 | Admitting: Obstetrics and Gynecology

## 2017-07-08 ENCOUNTER — Other Ambulatory Visit: Payer: Self-pay

## 2017-07-08 ENCOUNTER — Encounter: Payer: Self-pay | Admitting: Obstetrics and Gynecology

## 2017-07-08 ENCOUNTER — Ambulatory Visit (INDEPENDENT_AMBULATORY_CARE_PROVIDER_SITE_OTHER): Payer: 59 | Admitting: Obstetrics and Gynecology

## 2017-07-08 VITALS — BP 140/90 | HR 66 | Resp 14 | Ht 62.5 in | Wt 156.0 lb

## 2017-07-08 DIAGNOSIS — Z01419 Encounter for gynecological examination (general) (routine) without abnormal findings: Secondary | ICD-10-CM

## 2017-07-08 DIAGNOSIS — R7989 Other specified abnormal findings of blood chemistry: Secondary | ICD-10-CM

## 2017-07-08 DIAGNOSIS — N3281 Overactive bladder: Secondary | ICD-10-CM | POA: Diagnosis not present

## 2017-07-08 MED ORDER — MIRABEGRON ER 25 MG PO TB24
25.0000 mg | ORAL_TABLET | Freq: Every day | ORAL | 12 refills | Status: DC
Start: 2017-07-08 — End: 2017-07-21

## 2017-07-08 NOTE — Patient Instructions (Signed)

## 2017-07-08 NOTE — Progress Notes (Signed)
67 y.o. [id number] Married Caucasian female here for annual exam.    On  ERT.  Would like to wean off.   Urinary frequency and urgency.  Elisabeth Cara in the past and had a lot of dry mouth symptoms.  Does not use caffeine.  No leakage of urine with sneezing and tennis.  Doing treatment of facial lesions with chemocream.   Had abdominal pain last fall and went to the ER.  Had negative cardiac eval and had follow up with endoscopy for stomach thickening.  Had a normal endoscopy per patient.   New 2 month old grandson.  Son marrying on [place] this year.  PCP:   Dr. Donnie Coffin   Patient's last menstrual period was 06/15/2004 (approximate).           Sexually active: Yes.    The current method of family planning is status post hysterectomy.    Exercising: Yes.    tennis and walking Smoker:  no  Health Maintenance: Pap:  06/2005 Negative - Hysterectomy for AUB and fibroids History of abnormal Pap:  no MMG:  03/15/17 BIRADS 1 negative/density b Colonoscopy:  4/10/ 2017 Diverticulosis f/u 10 years BMD:   03/28/17  Result  Osteopenia TDaP:  10/09/13 Gardasil:   n/a HIV: 03/11/17 negative  Hep C: 03/24/15 Negative Screening Labs:  Hb today: PCP, Urine today:   reports that she quit smoking about 44 years ago. Her smoking use included cigarettes. she has never used smokeless tobacco. She reports that she drinks about 8.4 oz of alcohol per week. She reports that she does not use drugs.  Past Medical History:  Diagnosis Date  . Bladder tumor    BENIGN  . Claustrophobia   . Elevated liver function tests 2016  . Gastritis determined by endoscopy 03/21/2017   Dr. Collene Mares  . GERD (gastroesophageal reflux disease)   . Hiatal hernia 05/29/15   CT scan of abdomen and pelvis  . Osteopenia 2018  . Sigmoid diverticulitis 05/29/15   CT scan of abdomen and pelvis  . Urinary urgency     Past Surgical History:  Procedure Laterality Date  . ABDOMINAL HYSTERECTOMY    . ABDOMINAL  SACROCOLPOPEXY N/A 11/06/2013   Procedure: ABDOMINO SACROCOLPOPEXY with Halban's culdoplasty;  Surgeon: Jamey Reas de Berton Lan, MD;  Location: [facility];  Service: Gynecology;  Laterality: N/A;  . ANTERIOR AND POSTERIOR REPAIR N/A 11/06/2013   Procedure: ANTERIOR (CYSTOCELE) AND POSTERIOR REPAIR (RECTOCELE);  Surgeon: Jamey Reas de Berton Lan, MD;  Location: [facility];  Service: Gynecology;  Laterality: N/A;  . ANTERIOR CRUCIATE LIGAMENT REPAIR Right   . BLADDER SUSPENSION N/A 11/06/2013   Procedure: TRANSVAGINAL TAPE (TVT) PROCEDURE exact midurethral sling;  Surgeon: Everardo All Amundson de Berton Lan, MD;  Location: [facility];  Service: Gynecology;  Laterality: N/A;  . BLADDER TUMOR EXCISION  1992   benign  . CYSTOSCOPY N/A 11/06/2013   Procedure: CYSTOSCOPY;  Surgeon: Jamey Reas de Berton Lan, MD;  Location: [facility];  Service: Gynecology;  Laterality: N/A;  . HIATAL HERNIA REPAIR N/A 05/27/2014   Procedure: LAPAROSCOPIC REPAIR OF HIATAL HERNIA WITH MESH;  Surgeon: Pedro Earls, MD;  Location: [facility];  Service: General;  Laterality: N/A;  . INSERTION OF MESH  05/27/2014   Procedure: INSERTION OF MESH;  Surgeon: Pedro Earls, MD;  Location: [facility];  Service: General;;  two different mesh used for two different sites  . SPIGELIAN HERNIA Right 05/27/2014  Procedure: RIGHT SPIGELIAN HERNIA REPAIR WITH MESH;  Surgeon: Pedro Earls, MD;  Location: [facility];  Service: General;  Laterality: Right;  . TOTAL ABDOMINAL HYSTERECTOMY W/ BILATERAL SALPINGOOPHORECTOMY  06/15/04   dermoid and AUB    Current Outpatient Medications  Medication Sig Dispense Refill  . acetaminophen (TYLENOL) 500 MG tablet Take 1,000 mg by mouth every 6 (six) hours as needed for fever (fever).    . Calcium Carbonate (CALTRATE 600 PO) Take by mouth.    . cholecalciferol (VITAMIN D) 1000 UNITS tablet Take 2,000 Units by mouth daily.     Marland Kitchen estradiol (MINIVELLE) 0.025 MG/24HR Place 1 patch onto  the skin 2 (two) times a week. 24 patch 0  . Multiple Vitamins-Minerals (CENTRUM SILVER PO) Take by mouth.    . Probiotic Product ([place]) 170 MG CAPS Take 1 capsule by mouth daily as needed (stomach issues).     Marland Kitchen omeprazole (PRILOSEC) 40 MG capsule TAKE ONE CAPSULE EVERY DAY 20 MINUTES BEFORE BREAKFAST  9   No current facility-administered medications for this visit.     Family History  Problem Relation Age of Onset  . Sudden Cardiac Death Father 15       dued in his sleep, unkown cause  . Cancer Maternal Grandfather     ROS:  Pertinent items are noted in HPI.  Otherwise, a comprehensive ROS was negative.  Exam:   BP 140/90 (BP Location: Right Arm, Patient Position: Sitting, Cuff Size: Normal)   Pulse 66   Resp 14   Ht 5' 2.5" (1.588 m)   Wt 156 lb (70.8 kg)   LMP 06/15/2004 (Approximate)   BMI 28.08 kg/m     General appearance: alert, cooperative and appears stated age Head: Normocephalic, without obvious abnormality, atraumatic Neck: no adenopathy, supple, symmetrical, trachea midline and thyroid normal to inspection and palpation Lungs: clear to auscultation bilaterally Breasts: normal appearance, no masses or tenderness, No nipple retraction on right but old nipple inversion on the left, No nipple discharge or bleeding, No axillary or supraclavicular adenopathy Heart: regular rate and rhythm Abdomen: soft, non-tender; no masses, no organomegaly Extremities: extremities normal, atraumatic, no cyanosis or edema Skin: Skin color, texture, turgor normal. No rashes or lesions Lymph nodes: Cervical, supraclavicular, and axillary nodes normal. No abnormal inguinal nodes palpated Neurologic: Grossly normal  Pelvic: External genitalia:  no lesions              Urethra:  normal appearing urethra with no masses, tenderness or lesions              Bartholins and Skenes: normal                 Vagina: normal appearing vagina with normal color and discharge, no  lesions.  No mesh exposure or erosion of apex or suburethral area.              Cervix:  Absent.              Pap taken: No. Bimanual Exam:  Uterus:   Absent.              Adnexa: no mass, fullness, tenderness              Rectal exam: Yes.  .  Confirms.              Anus:  normal sphincter tone, no lesions  Chaperone was present for exam.  Assessment:   Well woman visit with normal exam. Status  post TAH/BSO.  Status post abdominosacrocolpopexy.  Status post hiatal hernia repair and abdominal wall hernia repair.  On ERT.  Hx low vit D. Overactive bladder.  Mildly elevated BP.  Not on medication.  Left nipple inversion (old change).   Plan: Mammogram screening discussed. Recommended self breast awareness. Pap and HR HPV as above. Guidelines for Calcium, Vitamin D, regular exercise program including cardiovascular and weight bearing exercise. Will try to stop her HRT.  We reviewed WHI and risks of stroke, DVT, and PE.  Myrbetriq 25 mg daily.  I discussed side effects.  Vit D check today.  BMD in 2020. Routine labs with PCP.  FU annually and prn.  FU in 4 weeks for Myrbetriq check.   After visit summary provided.

## 2017-07-09 LAB — VITAMIN D 25 HYDROXY (VIT D DEFICIENCY, FRACTURES): Vit D, 25-Hydroxy: 52.9 ng/mL (ref 30.0–100.0)

## 2017-07-20 ENCOUNTER — Telehealth: Payer: Self-pay | Admitting: Obstetrics and Gynecology

## 2017-07-20 NOTE — Telephone Encounter (Signed)
Ok for Ditropan XL 5 mg daily.  #30, RF 11.  She can just call to let me know how she is doing.  She does not need a blood pressure recheck on this medication.  I am prescribing a low dose to try to control dry mouth and constipation which are side effects.

## 2017-07-20 NOTE — Telephone Encounter (Signed)
Per review of patient's insurance formulary for 2019 appears Oxybutynin Er and Oxybutynin are tier 1. Routing to [place] for review and advise.

## 2017-07-20 NOTE — Telephone Encounter (Signed)
Patient called and requested an alternative to Mybetriq which she said will cost her $400.00 a month. Pharmacy on file confirmed.

## 2017-07-21 MED ORDER — OXYBUTYNIN CHLORIDE ER 5 MG PO TB24: 5.0000 mg | ORAL_TABLET | Freq: Every day | ORAL | 11 refills | Status: DC

## 2017-07-21 NOTE — Telephone Encounter (Signed)
Return call to Kaitlyn. °

## 2017-07-21 NOTE — Telephone Encounter (Signed)
Left message to call Kaitlyn at [phone]. 

## 2017-07-21 NOTE — Telephone Encounter (Signed)
Spoke with patient. Advised of message as seen below from [place]. Patient verbalizes understanding. Rx for Ditropan XL 5 mg daily #30 11RF sent to pharmacy on file. Patient will call with an update on how she is doing on this medication in 4 weeks.  Routing to provider for final review. Patient agreeable to disposition. Will close encounter.

## 2017-08-15 ENCOUNTER — Ambulatory Visit: Payer: 59 | Admitting: Obstetrics and Gynecology

## 2017-08-22 ENCOUNTER — Telehealth: Payer: Self-pay | Admitting: Obstetrics and Gynecology

## 2017-08-22 NOTE — Telephone Encounter (Signed)
Reviewed with Dr. Quincy Simmonds, can add to schedule today or 4/9 morning schedule.   Call returned to patient, OV scheduled for 4/9 at 8:15am with Dr. Quincy Simmonds. Patient asking if she needs to continue oxybutynin? Ok to take Azo? Advised will review with Dr. Quincy Simmonds and return call.   Dr. Quincy Simmonds -please advise on medications?

## 2017-08-22 NOTE — Telephone Encounter (Signed)
Patient called during lunch and left a message requesting an appointment with Dr. Quincy Simmonds for a possible urinary tract infection.

## 2017-08-22 NOTE — Telephone Encounter (Signed)
Stop Oxybutynin.  Please do not take the Pyridium (AZO).

## 2017-08-22 NOTE — Telephone Encounter (Signed)
Spoke with patient. Reports urinary frequency and voiding small amounts, symptoms started today. Started oxybutynin 07/21/17. Denies any other symptoms, requesting OV today if available. Has another doctors appt at [facility] will review with Dr. Quincy Simmonds and return call, patient agreeable.

## 2017-08-22 NOTE — Telephone Encounter (Signed)
Call returned, left detailed message, ok per dpr. Advised as seen below per Dr. Quincy Simmonds, will see you for your appt on 4/9.  Will close encounter.

## 2017-08-23 ENCOUNTER — Encounter: Payer: Self-pay | Admitting: Obstetrics and Gynecology

## 2017-08-23 ENCOUNTER — Other Ambulatory Visit: Payer: Self-pay

## 2017-08-23 ENCOUNTER — Ambulatory Visit (INDEPENDENT_AMBULATORY_CARE_PROVIDER_SITE_OTHER): Payer: 59 | Admitting: Obstetrics and Gynecology

## 2017-08-23 VITALS — BP 148/82 | HR 76 | Resp 16 | Wt 151.0 lb

## 2017-08-23 DIAGNOSIS — N3001 Acute cystitis with hematuria: Secondary | ICD-10-CM | POA: Diagnosis not present

## 2017-08-23 DIAGNOSIS — R300 Dysuria: Secondary | ICD-10-CM | POA: Diagnosis not present

## 2017-08-23 LAB — POCT URINALYSIS DIPSTICK
BILIRUBIN UA: NEGATIVE
Glucose, UA: NEGATIVE
KETONES UA: NEGATIVE
Nitrite, UA: NEGATIVE
PH UA: 5 (ref 5.0–8.0)
UROBILINOGEN UA: 0.2 U/dL

## 2017-08-23 MED ORDER — NITROFURANTOIN MONOHYD MACRO 100 MG PO CAPS
100.0000 mg | ORAL_CAPSULE | Freq: Two times a day (BID) | ORAL | 0 refills | Status: DC
Start: 2017-08-23 — End: 2017-08-30

## 2017-08-23 NOTE — Addendum Note (Signed)
Addended by: Terence Lux A on: 08/23/2017 09:33 AM   Modules accepted: Orders

## 2017-08-23 NOTE — Patient Instructions (Signed)

## 2017-08-23 NOTE — Progress Notes (Signed)
GYNECOLOGY  VISIT   HPI: 67 y.o.   Married  Caucasian  female   [phone] with Patient's last menstrual period was 06/15/2004 (approximate).   here c/o dysuria and urinary frequency X 2 days. No fever, nausea, vomiting, back pain. Taking Oxybutynin since the end of February.  Feeling some improvement just recently.  Drinking water at night.  Up 2 - 3 times per night.  Not a caffeine drinker.   Notes a lump on her left abdominal sidewall.  Urine dip:  3+ RBC, 2+ WBC, 2+ protein.    GYNECOLOGIC HISTORY: Patient's last menstrual period was 06/15/2004 (approximate). Contraception:  Hysterectomy for AUB and fibroids Menopausal hormone therapy:  MINIVELLE Last mammogram:  03/15/17 BIRADS 1 negative/density b Last pap smear:    06/2005 Negative        OB History    Gravida  3   Para  3   Term  3   Preterm      AB      Living  3     SAB      TAB      Ectopic      Multiple      Live Births                 Patient Active Problem List   Diagnosis Date Noted  . Elevated troponin 03/11/2017  . Epigastric pain 03/11/2017  . Anemia due to acute blood loss 06/03/2014  . Status post Nissen fundoplication and repair of large hiatal hernia Jan 2016 05/30/2014    Past Medical History:  Diagnosis Date  . Bladder tumor    BENIGN  . Claustrophobia   . Elevated liver function tests 2016  . Gastritis determined by endoscopy 03/21/2017   Dr. Collene Mares  . GERD (gastroesophageal reflux disease)   . Hiatal hernia 05/29/15   CT scan of abdomen and pelvis  . Osteopenia 2018  . Sigmoid diverticulitis 05/29/15   CT scan of abdomen and pelvis  . Urinary urgency     Past Surgical History:  Procedure Laterality Date  . ABDOMINAL HYSTERECTOMY    . ABDOMINAL SACROCOLPOPEXY N/A 11/06/2013   Procedure: ABDOMINO SACROCOLPOPEXY with Halban's culdoplasty;  Surgeon: Jamey Reas de Berton Lan, MD;  Location: [facility];  Service: Gynecology;  Laterality: N/A;  . ANTERIOR AND  POSTERIOR REPAIR N/A 11/06/2013   Procedure: ANTERIOR (CYSTOCELE) AND POSTERIOR REPAIR (RECTOCELE);  Surgeon: Jamey Reas de Berton Lan, MD;  Location: [facility];  Service: Gynecology;  Laterality: N/A;  . ANTERIOR CRUCIATE LIGAMENT REPAIR Right   . BLADDER SUSPENSION N/A 11/06/2013   Procedure: TRANSVAGINAL TAPE (TVT) PROCEDURE exact midurethral sling;  Surgeon: Everardo All Amundson de Berton Lan, MD;  Location: [facility];  Service: Gynecology;  Laterality: N/A;  . BLADDER TUMOR EXCISION  1992   benign  . CYSTOSCOPY N/A 11/06/2013   Procedure: CYSTOSCOPY;  Surgeon: Jamey Reas de Berton Lan, MD;  Location: [facility];  Service: Gynecology;  Laterality: N/A;  . HIATAL HERNIA REPAIR N/A 05/27/2014   Procedure: LAPAROSCOPIC REPAIR OF HIATAL HERNIA WITH MESH;  Surgeon: Pedro Earls, MD;  Location: [facility];  Service: General;  Laterality: N/A;  . INSERTION OF MESH  05/27/2014   Procedure: INSERTION OF MESH;  Surgeon: Pedro Earls, MD;  Location: [facility];  Service: General;;  two different mesh used for two different sites  . SPIGELIAN HERNIA Right 05/27/2014   Procedure: RIGHT SPIGELIAN HERNIA REPAIR WITH MESH;  Surgeon: Pedro Earls, MD;  Location: [facility];  Service: General;  Laterality: Right;  . TOTAL ABDOMINAL HYSTERECTOMY W/ BILATERAL SALPINGOOPHORECTOMY  06/15/04   dermoid and AUB    Current Outpatient Medications  Medication Sig Dispense Refill  . acetaminophen (TYLENOL) 500 MG tablet Take 1,000 mg by mouth every 6 (six) hours as needed for fever (fever).    . Calcium Carbonate (CALTRATE 600 PO) Take by mouth.    . cholecalciferol (VITAMIN D) 1000 UNITS tablet Take 2,000 Units by mouth daily.     . Multiple Vitamins-Minerals (CENTRUM SILVER PO) Take by mouth.    Marland Kitchen omeprazole (PRILOSEC) 40 MG capsule TAKE ONE CAPSULE EVERY DAY 20 MINUTES BEFORE BREAKFAST  9  . oxybutynin (DITROPAN XL) 5 MG 24 hr tablet Take 1 tablet (5 mg total) by mouth at bedtime. 30 tablet 11  .  Probiotic Product ([place]) 170 MG CAPS Take 1 capsule by mouth daily as needed (stomach issues).      No current facility-administered medications for this visit.      ALLERGIES: Sulfa antibiotics  Family History  Problem Relation Age of Onset  . Sudden Cardiac Death Father 7       dued in his sleep, unkown cause  . Cancer Maternal Grandfather     Social History   Socioeconomic History  . Marital status: Married    Spouse name: Not on file  . Number of children: 3  . Years of education: Not on file  . Highest education level: Not on file  Occupational History  . Not on file  Social Needs  . Financial resource strain: Not on file  . Food insecurity:    Worry: Not on file    Inability: Not on file  . Transportation needs:    Medical: Not on file    Non-medical: Not on file  Tobacco Use  . Smoking status: Former Smoker    Types: Cigarettes    Last attempt to quit: 04/27/1973    Years since quitting: 44.3  . Smokeless tobacco: Never Used  Substance and Sexual Activity  . Alcohol use: Yes    Alcohol/week: 8.4 oz    Types: 14 Glasses of wine per week    Comment: 10-14 glasses of wine a week  . Drug use: No  . Sexual activity: Yes    Partners: Male    Birth control/protection: Surgical    Comment: vasectomy/TAH  Lifestyle  . Physical activity:    Days per week: Not on file    Minutes per session: Not on file  . Stress: Not on file  Relationships  . Social connections:    Talks on phone: Not on file    Gets together: Not on file    Attends religious service: Not on file    Active member of club or organization: Not on file    Attends meetings of clubs or organizations: Not on file    Relationship status: Not on file  . Intimate partner violence:    Fear of current or ex partner: Not on file    Emotionally abused: Not on file    Physically abused: Not on file    Forced sexual activity: Not on file  Other Topics Concern  . Not on file   Social History Narrative  . Not on file    ROS:  Pertinent items are noted in HPI.  PHYSICAL EXAMINATION:    BP (!) 148/82 (BP Location: Right Arm, Patient Position: Sitting, Cuff  Size: Normal)   Pulse 76   Resp 16   Wt 151 lb (68.5 kg)   LMP 06/15/2004 (Approximate)   BMI 27.18 kg/m     General appearance: alert, cooperative and appears stated age   Pelvic: External genitalia:  no lesions              Urethra:  normal appearing urethra with no masses, tenderness or lesions              Bartholins and Skenes: normal                 Vagina: normal appearing vagina with normal color and discharge, no lesions              Cervix: absent.                Bimanual Exam:  Uterus:   absent              Adnexa: no mass, fullness, tenderness    Left abdominal sidewall:  3 - 4 cm subcutaneous lump consistent with lipoma.       Chaperone was present for exam.  ASSESSMENT  UTI. Overactive bladder.  On Oxybutynin.  Lipoma.   PLAN  Urine micro and culture. Macrobid 100 mg po bid x 5 days.  AZO standard po tid prn x 2 days.  Stop Oxybutynin while treating for UTI. Return if not improved in 48 hours. Discussed lipoma and need for gen surgery consult if increases in size or becomes painful. FU prn.  An After Visit Summary was printed and given to the patient.  ___15___ minutes face to face time of which over 50% was spent in counseling.

## 2017-08-24 LAB — URINALYSIS, MICROSCOPIC ONLY
Casts: NONE SEEN /lpf
WBC, UA: >30 /hpf — AB (ref 0–5)

## 2017-08-24 LAB — URINE CULTURE

## 2017-08-30 ENCOUNTER — Encounter: Payer: Self-pay | Admitting: Obstetrics and Gynecology

## 2017-08-30 ENCOUNTER — Other Ambulatory Visit: Payer: Self-pay

## 2017-08-30 ENCOUNTER — Ambulatory Visit (INDEPENDENT_AMBULATORY_CARE_PROVIDER_SITE_OTHER): Payer: 59 | Admitting: Obstetrics and Gynecology

## 2017-08-30 VITALS — BP 132/70 | HR 68 | Resp 14 | Ht 62.5 in | Wt 151.0 lb

## 2017-08-30 DIAGNOSIS — N952 Postmenopausal atrophic vaginitis: Secondary | ICD-10-CM

## 2017-08-30 DIAGNOSIS — N342 Other urethritis: Secondary | ICD-10-CM | POA: Diagnosis not present

## 2017-08-30 DIAGNOSIS — N3281 Overactive bladder: Secondary | ICD-10-CM

## 2017-08-30 MED ORDER — ESTROGENS, CONJUGATED 0.625 MG/GM VA CREA: TOPICAL_CREAM | VAGINAL | 2 refills | Status: DC

## 2017-08-30 MED ORDER — MIRABEGRON ER 25 MG PO TB24: 25.0000 mg | ORAL_TABLET | Freq: Every day | ORAL | 1 refills | Status: DC

## 2017-08-30 NOTE — Progress Notes (Signed)
GYNECOLOGY  VISIT   HPI: 67 y.o.   Married  Caucasian  female   [phone] with Patient's last menstrual period was 06/15/2004 (approximate).   here for 1 week f/u -- patient states that she is feeling better  Seen for urinary frequency and dysuria.  Positive urine dip with 3+ RBC and 2+ WBC, 2+ protein then.  Urine micro showed >30 WBC, otherwise negative.  Had a negative urine culture.   She had started Oxybutynin at her last visit.  She was on it for a month prior to this.   Elisabeth Cara in the past and she extreme thirst and so she did not get a good response.   Increased intercourse recently.  Used Premarin in the past.   Urine dip - negative.   Son getting married this weekend at [employer].  GYNECOLOGIC HISTORY: Patient's last menstrual period was 06/15/2004 (approximate). Contraception:  Hysterectomy for AUB and fibroids  Menopausal hormone therapy:  Minivelle Last mammogram:  03/15/17 BIRADS 1 negative/density b Last pap smear:   06/2005 Negative        OB History    Gravida  3   Para  3   Term  3   Preterm      AB      Living  3     SAB      TAB      Ectopic      Multiple      Live Births                 Patient Active Problem List   Diagnosis Date Noted  . Elevated troponin 03/11/2017  . Epigastric pain 03/11/2017  . Anemia due to acute blood loss 06/03/2014  . Status post Nissen fundoplication and repair of large hiatal hernia Jan 2016 05/30/2014    Past Medical History:  Diagnosis Date  . Bladder tumor    BENIGN  . Claustrophobia   . Elevated liver function tests 2016  . Gastritis determined by endoscopy 03/21/2017   Dr. Collene Mares  . GERD (gastroesophageal reflux disease)   . Hiatal hernia 05/29/15   CT scan of abdomen and pelvis  . Osteopenia 2018  . Sigmoid diverticulitis 05/29/15   CT scan of abdomen and pelvis  . Urinary urgency     Past Surgical History:  Procedure Laterality Date  . ABDOMINAL HYSTERECTOMY    .  ABDOMINAL SACROCOLPOPEXY N/A 11/06/2013   Procedure: ABDOMINO SACROCOLPOPEXY with Halban's culdoplasty;  Surgeon: Jamey Reas de Berton Lan, MD;  Location: [facility];  Service: Gynecology;  Laterality: N/A;  . ANTERIOR AND POSTERIOR REPAIR N/A 11/06/2013   Procedure: ANTERIOR (CYSTOCELE) AND POSTERIOR REPAIR (RECTOCELE);  Surgeon: Jamey Reas de Berton Lan, MD;  Location: [facility];  Service: Gynecology;  Laterality: N/A;  . ANTERIOR CRUCIATE LIGAMENT REPAIR Right   . BLADDER SUSPENSION N/A 11/06/2013   Procedure: TRANSVAGINAL TAPE (TVT) PROCEDURE exact midurethral sling;  Surgeon: Everardo All Amundson de Berton Lan, MD;  Location: [facility];  Service: Gynecology;  Laterality: N/A;  . BLADDER TUMOR EXCISION  1992   benign  . CYSTOSCOPY N/A 11/06/2013   Procedure: CYSTOSCOPY;  Surgeon: Jamey Reas de Berton Lan, MD;  Location: [facility];  Service: Gynecology;  Laterality: N/A;  . HIATAL HERNIA REPAIR N/A 05/27/2014   Procedure: LAPAROSCOPIC REPAIR OF HIATAL HERNIA WITH MESH;  Surgeon: Pedro Earls, MD;  Location: [facility];  Service: General;  Laterality: N/A;  . INSERTION  OF MESH  05/27/2014   Procedure: INSERTION OF MESH;  Surgeon: Pedro Earls, MD;  Location: [facility];  Service: General;;  two different mesh used for two different sites  . SPIGELIAN HERNIA Right 05/27/2014   Procedure: RIGHT SPIGELIAN HERNIA REPAIR WITH MESH;  Surgeon: Pedro Earls, MD;  Location: [facility];  Service: General;  Laterality: Right;  . TOTAL ABDOMINAL HYSTERECTOMY W/ BILATERAL SALPINGOOPHORECTOMY  06/15/04   dermoid and AUB    Current Outpatient Medications  Medication Sig Dispense Refill  . acetaminophen (TYLENOL) 500 MG tablet Take 1,000 mg by mouth every 6 (six) hours as needed for fever (fever).    . Calcium Carbonate (CALTRATE 600 PO) Take by mouth.    . cholecalciferol (VITAMIN D) 1000 UNITS tablet Take 2,000 Units by mouth daily.     . Multiple Vitamins-Minerals (CENTRUM SILVER PO)  Take by mouth.    Marland Kitchen omeprazole (PRILOSEC) 40 MG capsule TAKE ONE CAPSULE EVERY DAY 20 MINUTES BEFORE BREAKFAST  9  . Probiotic Product ([place]) 170 MG CAPS Take 1 capsule by mouth daily as needed (stomach issues).     Marland Kitchen oxybutynin (DITROPAN XL) 5 MG 24 hr tablet Take 1 tablet (5 mg total) by mouth at bedtime. (Patient not taking: Reported on 08/30/2017) 30 tablet 11   No current facility-administered medications for this visit.      ALLERGIES: Sulfa antibiotics  Family History  Problem Relation Age of Onset  . Sudden Cardiac Death Father 20       dued in his sleep, unkown cause  . Cancer Maternal Grandfather     Social History   Socioeconomic History  . Marital status: Married    Spouse name: Not on file  . Number of children: 3  . Years of education: Not on file  . Highest education level: Not on file  Occupational History  . Not on file  Social Needs  . Financial resource strain: Not on file  . Food insecurity:    Worry: Not on file    Inability: Not on file  . Transportation needs:    Medical: Not on file    Non-medical: Not on file  Tobacco Use  . Smoking status: Former Smoker    Types: Cigarettes    Last attempt to quit: 04/27/1973    Years since quitting: 44.3  . Smokeless tobacco: Never Used  Substance and Sexual Activity  . Alcohol use: Yes    Alcohol/week: 8.4 oz    Types: 14 Glasses of wine per week    Comment: 10-14 glasses of wine a week  . Drug use: No  . Sexual activity: Yes    Partners: Male    Birth control/protection: Surgical    Comment: vasectomy/TAH  Lifestyle  . Physical activity:    Days per week: Not on file    Minutes per session: Not on file  . Stress: Not on file  Relationships  . Social connections:    Talks on phone: Not on file    Gets together: Not on file    Attends religious service: Not on file    Active member of club or organization: Not on file    Attends meetings of clubs or organizations: Not on file     Relationship status: Not on file  . Intimate partner violence:    Fear of current or ex partner: Not on file    Emotionally abused: Not on file    Physically abused: Not on file  Forced sexual activity: Not on file  Other Topics Concern  . Not on file  Social History Narrative  . Not on file    ROS:  Pertinent items are noted in HPI.  PHYSICAL EXAMINATION:    BP 132/70 (BP Location: Right Arm, Patient Position: Sitting, Cuff Size: Normal)   Pulse 68   Resp 14   Ht 5' 2.5" (1.588 m)   Wt 151 lb (68.5 kg)   LMP 06/15/2004 (Approximate)   BMI 27.18 kg/m     General appearance: alert, cooperative and appears stated age   ASSESSMENT  Recent urethritis.  Vaginal atrophy.  Overactive bladder syndrome.  Intolerant of Toviaz.  Urethritis with Oxybutynin. (May have also been due to intercourse.)  PLAN  Start Premarin vaginal cream - use vaginally and around the urethra.  Coupon given. Discussed possible effect on breast cancer.   Stop Oxybutynin.  Start Myrbetriq 25 mg daily.   Discussed possible effect on blood pressure.  FU in 7 weeks.    An After Visit Summary was printed and given to the patient.  _15_____ minutes face to face time of which over 50% was spent in counseling.

## 2017-10-19 ENCOUNTER — Telehealth: Payer: Self-pay | Admitting: Obstetrics and Gynecology

## 2017-10-19 NOTE — Telephone Encounter (Signed)
Patient called and cancelled her 7 week recheck appointment with Dr. Quincy Simmonds on 10/26/17. She said she is doing well and doesn't need the appointment. Patient also stated she never started her Mybetric prescription because she never picked it up at the pharmacy.

## 2017-10-19 NOTE — Telephone Encounter (Signed)
I reviewed this patient's phone message and chart.  I closed this encounter.

## 2017-10-26 ENCOUNTER — Ambulatory Visit: Payer: 59 | Admitting: Obstetrics and Gynecology

## 2017-11-26 ENCOUNTER — Other Ambulatory Visit: Payer: Self-pay | Admitting: Obstetrics and Gynecology

## 2017-11-28 NOTE — Telephone Encounter (Signed)
Medication refill request: Premarin Vaginal cream Last AEX:  07/08/17 Next AEX: 07/21/18 Last MMG (if hormonal medication request): 03/16/18 Bi-rads Category 1 neg  Refill authorized: Please refill if appropriate.

## 2018-02-18 ENCOUNTER — Other Ambulatory Visit: Payer: Self-pay | Admitting: Obstetrics and Gynecology

## 2018-02-20 NOTE — Telephone Encounter (Signed)
Medication refill request: premarin Last AEX:  07/08/2017 Next AEX: 07/21/2018 Last MMG (if hormonal medication request): 03/15/2017 BI-RADS CATEGORY  1: Negative. Refill authorized: #30g, 2 refills

## 2018-03-01 ENCOUNTER — Other Ambulatory Visit: Payer: Self-pay | Admitting: Obstetrics and Gynecology

## 2018-03-01 DIAGNOSIS — Z1231 Encounter for screening mammogram for malignant neoplasm of breast: Secondary | ICD-10-CM

## 2018-03-18 ENCOUNTER — Other Ambulatory Visit: Payer: Self-pay | Admitting: Obstetrics and Gynecology

## 2018-03-20 NOTE — Telephone Encounter (Signed)
Medication refill request: premarin vag cream  Last AEX:  07/08/17  Next AEX: 07/21/18 Dr. Quincy Simmonds  Last MMG (if hormonal medication request): 03/15/17 BIRADS1:Neg  Refill authorized: 02/20/18 #30g/0R. Today please advise.

## 2018-04-05 ENCOUNTER — Ambulatory Visit
Admission: RE | Admit: 2018-04-05 | Discharge: 2018-04-05 | Disposition: A | Discharge status: Home / Self Care | Payer: 59 | Source: Ambulatory Visit | Attending: Obstetrics and Gynecology | Admitting: Obstetrics and Gynecology

## 2018-04-05 DIAGNOSIS — Z1231 Encounter for screening mammogram for malignant neoplasm of breast: Secondary | ICD-10-CM

## 2018-04-19 ENCOUNTER — Other Ambulatory Visit: Payer: Self-pay | Admitting: Obstetrics and Gynecology

## 2018-04-19 NOTE — Telephone Encounter (Signed)
Medication refill request: premarin vaginal cream Last AEX:  07-08-17 Next AEX: 07-21-18 Last MMG (if hormonal medication request): 04-05-18 neg Refill authorized: refill came in. Last refilled 03-20-18 30grams with 0 refills. Please approve if appropriate.

## 2018-05-21 ENCOUNTER — Other Ambulatory Visit: Payer: Self-pay | Admitting: Obstetrics and Gynecology

## 2018-07-21 ENCOUNTER — Ambulatory Visit: Payer: 59 | Admitting: Obstetrics and Gynecology

## 2018-07-25 NOTE — Progress Notes (Signed)
68 y.o. [id number] Married Caucasian female here for annual exam.    Did not try the Myrbetriq. Still dealing with overactive bladder.  Had dry mouth and thirst with Toviaz.  Urethritis with Oxybutynin.   Did pelvic floor PT prior to surgery for prolapse.   No leakage with laugh, cough, sneeze.   Using vaginal estrogen cream.  Does not need a refill.   Husband retired.  Traveled to [place].   PCP: Dr. Donnie Coffin     Patient's last menstrual period was 06/15/2004 (approximate).           Sexually active: Yes.    The current method of family planning is status post hysterectomy.    Exercising: Yes.    tennis and walking Smoker:  no  Health Maintenance: Pap:  06/2005 Negative History of abnormal Pap:  no MMG:  04/05/18 BIRADS 1 negative/density c Colonoscopy:  4/10/ 2017 Diverticulosis f/u 10 years BMD:   03/28/17  Result  Osteopenia  TDaP:  10/09/13 Gardasil:   n/a HIV: 03/11/17 Negative Hep C: 03/24/15 negative Screening Labs:  Hb today: PCP Flu vaccine:  Done.  Pneumococcus:  Done.  Shingrix:  Will do.    reports that she quit smoking about 45 years ago. Her smoking use included cigarettes. She has never used smokeless tobacco. She reports current alcohol use of about 14.0 standard drinks of alcohol per week. She reports that she does not use drugs.  Past Medical History:  Diagnosis Date  . Bladder tumor    BENIGN  . Claustrophobia   . Elevated liver function tests 2016  . Gastritis determined by endoscopy 03/21/2017   Dr. Collene Mares  . GERD (gastroesophageal reflux disease)   . Hiatal hernia 05/29/15   CT scan of abdomen and pelvis  . Osteopenia 2018  . Sigmoid diverticulitis 05/29/15   CT scan of abdomen and pelvis  . Urinary urgency     Past Surgical History:  Procedure Laterality Date  . ABDOMINAL HYSTERECTOMY    . ABDOMINAL SACROCOLPOPEXY N/A 11/06/2013   Procedure: ABDOMINO SACROCOLPOPEXY with Halban's culdoplasty;  Surgeon: Jamey Reas de Berton Lan, MD;  Location: [facility];  Service: Gynecology;  Laterality: N/A;  . ANTERIOR AND POSTERIOR REPAIR N/A 11/06/2013   Procedure: ANTERIOR (CYSTOCELE) AND POSTERIOR REPAIR (RECTOCELE);  Surgeon: Jamey Reas de Berton Lan, MD;  Location: [facility];  Service: Gynecology;  Laterality: N/A;  . ANTERIOR CRUCIATE LIGAMENT REPAIR Right   . BLADDER SUSPENSION N/A 11/06/2013   Procedure: TRANSVAGINAL TAPE (TVT) PROCEDURE exact midurethral sling;  Surgeon: Everardo All Amundson de Berton Lan, MD;  Location: [facility];  Service: Gynecology;  Laterality: N/A;  . BLADDER TUMOR EXCISION  1992   benign  . CYSTOSCOPY N/A 11/06/2013   Procedure: CYSTOSCOPY;  Surgeon: Jamey Reas de Berton Lan, MD;  Location: [facility];  Service: Gynecology;  Laterality: N/A;  . HIATAL HERNIA REPAIR N/A 05/27/2014   Procedure: LAPAROSCOPIC REPAIR OF HIATAL HERNIA WITH MESH;  Surgeon: Pedro Earls, MD;  Location: [facility];  Service: General;  Laterality: N/A;  . INSERTION OF MESH  05/27/2014   Procedure: INSERTION OF MESH;  Surgeon: Pedro Earls, MD;  Location: [facility];  Service: General;;  two different mesh used for two different sites  . SPIGELIAN HERNIA Right 05/27/2014   Procedure: RIGHT SPIGELIAN HERNIA REPAIR WITH MESH;  Surgeon: Pedro Earls, MD;  Location: [facility];  Service: General;  Laterality: Right;  . TOTAL ABDOMINAL HYSTERECTOMY  W/ BILATERAL SALPINGOOPHORECTOMY  06/15/04   dermoid and AUB    Current Outpatient Medications  Medication Sig Dispense Refill  . acetaminophen (TYLENOL) 500 MG tablet Take 1,000 mg by mouth every 6 (six) hours as needed for fever (fever).    . Calcium Carbonate (CALTRATE 600 PO) Take by mouth.    . cholecalciferol (VITAMIN D) 1000 UNITS tablet Take 2,000 Units by mouth daily.     Marland Kitchen conjugated estrogens (PREMARIN) vaginal cream USE 1/2 GM VAGINALLY 2 TIMES/WK 30 g 0  . Multiple Vitamins-Minerals (CENTRUM SILVER PO) Take by mouth.    Marland Kitchen omeprazole (PRILOSEC) 40 MG capsule  TAKE ONE CAPSULE EVERY DAY 20 MINUTES BEFORE BREAKFAST  9  . Probiotic Product ([place]) 170 MG CAPS Take 1 capsule by mouth daily as needed (stomach issues).      No current facility-administered medications for this visit.     Family History  Problem Relation Age of Onset  . Sudden Cardiac Death Father 75       dued in his sleep, unkown cause  . Cancer Maternal Grandfather     Review of Systems  Constitutional: Negative.   HENT: Negative.   Eyes: Negative.   Respiratory: Negative.   Cardiovascular: Negative.   Gastrointestinal: Negative.   Endocrine: Negative.   Genitourinary: Negative.   Musculoskeletal: Negative.   Skin: Negative.   Allergic/Immunologic: Negative.   Neurological: Negative.   Hematological: Negative.   Psychiatric/Behavioral: Negative.     Exam:   BP 130/78 (BP Location: Right Arm, Patient Position: Sitting, Cuff Size: Normal)   Pulse 76   Resp 16   Ht 5' 2.5" (1.588 m)   Wt 160 lb (72.6 kg)   LMP 06/15/2004 (Approximate)   BMI 28.80 kg/m     General appearance: alert, cooperative and appears stated age Head: Normocephalic, without obvious abnormality, atraumatic Neck: no adenopathy, supple, symmetrical, trachea midline and thyroid normal to inspection and palpation Lungs: clear to auscultation bilaterally Breasts: normal appearance, no masses or tenderness, No nipple retraction or dimpling, No nipple discharge or bleeding, No axillary or supraclavicular adenopathy Heart: regular rate and rhythm Abdomen: soft, non-tender; no masses, no organomegaly Extremities: extremities normal, atraumatic, no cyanosis or edema Skin: Skin color, texture, turgor normal. No rashes or lesions Lymph nodes: Cervical, supraclavicular, and axillary nodes normal. No abnormal inguinal nodes palpated Neurologic: Grossly normal  Pelvic: External genitalia:  Quarter size red patch of the left vulva where meets thigh.               Urethra:  normal  appearing urethra with no masses, tenderness or lesions              Bartholins and Skenes: normal                 Vagina: normal appearing vagina with normal color and discharge, no lesions.  Good support.               Cervix:  absent              Pap taken: No. Bimanual Exam:  Uterus:  absent              Adnexa: no mass, fullness, tenderness              Rectal exam: Yes.  .  Confirms.              Anus:  normal sphincter tone, no lesions  Chaperone was present for exam.  Assessment:  Well woman visit with normal exam. Status post TAH/BSO.  Status post abdominosacrocolpopexy.  Status post hiatal hernia repair and abdominal wall hernia repair.  Off ERT.  Vaginal atrophy.  Hx low vit D. Overactive bladder. On Myrbetriq.  Left nipple inversion (old change).  Osteopenia.   Plan: Mammogram screening. Recommended self breast awareness. Pap and HR HPV as above. Guidelines for Calcium, Vitamin D, regular exercise program including cardiovascular and weight bearing exercise. Continue vaginal estrogen 1/2 gram pv at hs 2 -3 times per week. Currently does not need Rx.  Triamcinolone ointment 0.25% to area bid.  Myrbetriq 25 mg daily.  FU in 6 weeks for a recheck.  Follow up annually and prn.   After visit summary provided.

## 2018-07-26 ENCOUNTER — Other Ambulatory Visit: Payer: Self-pay

## 2018-07-26 ENCOUNTER — Encounter: Payer: Self-pay | Admitting: Obstetrics and Gynecology

## 2018-07-26 ENCOUNTER — Ambulatory Visit (INDEPENDENT_AMBULATORY_CARE_PROVIDER_SITE_OTHER): Payer: Medicare Other | Admitting: Obstetrics and Gynecology

## 2018-07-26 VITALS — BP 130/78 | HR 76 | Resp 16 | Ht 62.5 in | Wt 160.0 lb

## 2018-07-26 DIAGNOSIS — Z124 Encounter for screening for malignant neoplasm of cervix: Secondary | ICD-10-CM

## 2018-07-26 DIAGNOSIS — N3281 Overactive bladder: Secondary | ICD-10-CM

## 2018-07-26 DIAGNOSIS — Z01419 Encounter for gynecological examination (general) (routine) without abnormal findings: Secondary | ICD-10-CM

## 2018-07-26 MED ORDER — MIRABEGRON ER 25 MG PO TB24: 25.0000 mg | ORAL_TABLET | Freq: Every day | ORAL | 1 refills | Status: DC

## 2018-07-26 MED ORDER — TRIAMCINOLONE ACETONIDE 0.025 % EX OINT: 1.0000 "application " | TOPICAL_OINTMENT | Freq: Two times a day (BID) | CUTANEOUS | 1 refills | Status: DC

## 2018-07-26 NOTE — Patient Instructions (Signed)

## 2018-07-27 ENCOUNTER — Telehealth: Payer: Self-pay | Admitting: Obstetrics and Gynecology

## 2018-07-27 NOTE — Telephone Encounter (Signed)
Patient called to let Dr. Quincy Simmonds know she went to pick up her Myrbetriq and it was too expensive. She said Dr. Quincy Simmonds told her to call if this happened to see what can be done since she's already tried two other medications.

## 2018-07-28 NOTE — Telephone Encounter (Signed)
Call to patient. Myrbetriq is tier 4 of her drug plan. She has to pay 49% of drug cost.  Cost to patient is $415.00 .  Advised tier exception has been requested.  Will take at least 72 hours to process request per health plan.  They will call her with response.

## 2018-08-04 NOTE — Telephone Encounter (Signed)
Call to patient. Advised rx at [facility].  Cost remains the same. She has to meet her deductible prior to drug coverage.  Declined another medication.  She will call back with any concerns.  Encounter closed.

## 2018-08-04 NOTE — Telephone Encounter (Signed)
Patient received approval from insurance company for [employer] and would like a [facility] [address]. [phone]

## 2018-08-11 DIAGNOSIS — Z0000 Encounter for general adult medical examination without abnormal findings: Secondary | ICD-10-CM | POA: Diagnosis not present

## 2018-08-11 DIAGNOSIS — E7800 Pure hypercholesterolemia, unspecified: Secondary | ICD-10-CM | POA: Diagnosis not present

## 2018-08-11 DIAGNOSIS — Z1159 Encounter for screening for other viral diseases: Secondary | ICD-10-CM | POA: Diagnosis not present

## 2018-08-11 DIAGNOSIS — M8580 Other specified disorders of bone density and structure, unspecified site: Secondary | ICD-10-CM | POA: Diagnosis not present

## 2018-08-11 DIAGNOSIS — K219 Gastro-esophageal reflux disease without esophagitis: Secondary | ICD-10-CM | POA: Diagnosis not present

## 2018-08-22 DIAGNOSIS — K219 Gastro-esophageal reflux disease without esophagitis: Secondary | ICD-10-CM | POA: Diagnosis not present

## 2018-08-22 DIAGNOSIS — K5900 Constipation, unspecified: Secondary | ICD-10-CM | POA: Diagnosis not present

## 2018-08-22 DIAGNOSIS — K5730 Diverticulosis of large intestine without perforation or abscess without bleeding: Secondary | ICD-10-CM | POA: Diagnosis not present

## 2018-09-04 ENCOUNTER — Other Ambulatory Visit: Payer: Self-pay

## 2018-09-05 ENCOUNTER — Ambulatory Visit (INDEPENDENT_AMBULATORY_CARE_PROVIDER_SITE_OTHER): Payer: Medicare Other | Admitting: Obstetrics and Gynecology

## 2018-09-05 ENCOUNTER — Telehealth: Payer: Self-pay | Admitting: Obstetrics and Gynecology

## 2018-09-05 ENCOUNTER — Encounter: Payer: Self-pay | Admitting: Obstetrics and Gynecology

## 2018-09-05 DIAGNOSIS — N3281 Overactive bladder: Secondary | ICD-10-CM | POA: Diagnosis not present

## 2018-09-05 NOTE — Telephone Encounter (Signed)
Reviewed telephone encounter dated 07/27/18. PA submitted and patient received approval from insurance company. She has to meet her deductible prior to drug coverage. Cost to patient $415  Call returned to patient. Reviewed as seen above. Patient is unsure what her deductible is, will clarify with spouse and insurance company, will then [facility] after deductible met. Patient has savings coupon, cost more with coupon. Patient aware to return call to office if any additional assistance needed.   Routing to provider for final review. Patient is agreeable to disposition. Will close encounter.

## 2018-09-05 NOTE — Telephone Encounter (Signed)
Please check the status of prior authorization for Myrbetriq for overactive bladder.   Patient has failed Toviaz and Ditropan XL in addition to failing physical therapy.   See note from phone visit on 09/05/18.

## 2018-09-05 NOTE — Progress Notes (Signed)
GYNECOLOGY  VISIT   HPI: 68 y.o.   Married  Caucasian  female   [phone] with Patient's last menstrual period was 06/15/2004 (approximate).   here for   phone visit for overactive bladder.  Permission for telephone visit.  Time of visit - 13 minutes. Started at 9:33 and ended at 9:46. Unable to do WebEx visit due to technical problems on her end.  She is at home and I am in the office.   Myrbetriq very expensive.  States we did a prior authorization and the prescription was still quite expensive.   Dry mouth and thirst with Toviaz.  She also tried Ditropan XL and had urethritis.  She did physical therapy with [facility] and still has urgency and frequency.   Even with decaffeinated tea, she still has urinary frequency and voiding excessively often.  This limits her physical activity.   Memorized all the bathroom locations in [place].   States that her red area of the vulva has resolved with use of steroid cream.   GYNECOLOGIC HISTORY: Patient's last menstrual period was 06/15/2004 (approximate). Contraception:  NA Menopausal hormone therapy:  Premarin cream Last mammogram:  04/05/18 - BI-RADS1, density c. Last pap smear:   06/2005 negative.  Status post hysterectomy.        OB History    Gravida  3   Para  3   Term  3   Preterm      AB      Living  3     SAB      TAB      Ectopic      Multiple      Live Births                 Patient Active Problem List   Diagnosis Date Noted  . Elevated troponin 03/11/2017  . Epigastric pain 03/11/2017  . Anemia due to acute blood loss 06/03/2014  . Status post Nissen fundoplication and repair of large hiatal hernia Jan 2016 05/30/2014    Past Medical History:  Diagnosis Date  . Bladder tumor    BENIGN  . Claustrophobia   . Elevated liver function tests 2016  . Gastritis determined by endoscopy 03/21/2017   Dr. Collene Mares  . GERD (gastroesophageal reflux disease)   . Hiatal hernia 05/29/15   CT scan of abdomen and  pelvis  . Osteopenia 2018  . Sigmoid diverticulitis 05/29/15   CT scan of abdomen and pelvis  . Urinary urgency     Past Surgical History:  Procedure Laterality Date  . ABDOMINAL HYSTERECTOMY    . ABDOMINAL SACROCOLPOPEXY N/A 11/06/2013   Procedure: ABDOMINO SACROCOLPOPEXY with Halban's culdoplasty;  Surgeon: Jamey Reas de Berton Lan, MD;  Location: [facility];  Service: Gynecology;  Laterality: N/A;  . ANTERIOR AND POSTERIOR REPAIR N/A 11/06/2013   Procedure: ANTERIOR (CYSTOCELE) AND POSTERIOR REPAIR (RECTOCELE);  Surgeon: Jamey Reas de Berton Lan, MD;  Location: [facility];  Service: Gynecology;  Laterality: N/A;  . ANTERIOR CRUCIATE LIGAMENT REPAIR Right   . BLADDER SUSPENSION N/A 11/06/2013   Procedure: TRANSVAGINAL TAPE (TVT) PROCEDURE exact midurethral sling;  Surgeon: Everardo All Amundson de Berton Lan, MD;  Location: [facility];  Service: Gynecology;  Laterality: N/A;  . BLADDER TUMOR EXCISION  1992   benign  . CYSTOSCOPY N/A 11/06/2013   Procedure: CYSTOSCOPY;  Surgeon: Jamey Reas de Berton Lan, MD;  Location: [facility];  Service: Gynecology;  Laterality: N/A;  . HIATAL  HERNIA REPAIR N/A 05/27/2014   Procedure: LAPAROSCOPIC REPAIR OF HIATAL HERNIA WITH MESH;  Surgeon: Pedro Earls, MD;  Location: [facility];  Service: General;  Laterality: N/A;  . INSERTION OF MESH  05/27/2014   Procedure: INSERTION OF MESH;  Surgeon: Pedro Earls, MD;  Location: [facility];  Service: General;;  two different mesh used for two different sites  . SPIGELIAN HERNIA Right 05/27/2014   Procedure: RIGHT SPIGELIAN HERNIA REPAIR WITH MESH;  Surgeon: Pedro Earls, MD;  Location: [facility];  Service: General;  Laterality: Right;  . TOTAL ABDOMINAL HYSTERECTOMY W/ BILATERAL SALPINGOOPHORECTOMY  06/15/04   dermoid and AUB    Current Outpatient Medications  Medication Sig Dispense Refill  . acetaminophen (TYLENOL) 500 MG tablet Take 1,000 mg by mouth every 6 (six) hours as needed for fever  (fever).    . Calcium Carbonate (CALTRATE 600 PO) Take by mouth.    . cholecalciferol (VITAMIN D) 1000 UNITS tablet Take 2,000 Units by mouth daily.     Marland Kitchen conjugated estrogens (PREMARIN) vaginal cream USE 1/2 GM VAGINALLY 2 TIMES/WK 30 g 0  . mirabegron ER (MYRBETRIQ) 25 MG TB24 tablet Take 1 tablet (25 mg total) by mouth daily. One po qd 30 tablet 1  . Multiple Vitamins-Minerals (CENTRUM SILVER PO) Take by mouth.    Marland Kitchen omeprazole (PRILOSEC) 40 MG capsule TAKE ONE CAPSULE EVERY DAY 20 MINUTES BEFORE BREAKFAST  9  . Probiotic Product ([place]) 170 MG CAPS Take 1 capsule by mouth daily as needed (stomach issues).     . triamcinolone (KENALOG) 0.025 % ointment Apply 1 application topically 2 (two) times daily. 30 g 1   No current facility-administered medications for this visit.      ALLERGIES: Sulfa antibiotics  Family History  Problem Relation Age of Onset  . Sudden Cardiac Death Father 35       dued in his sleep, unkown cause  . Cancer Maternal Grandfather     Social History   Socioeconomic History  . Marital status: Married    Spouse name: Not on file  . Number of children: 3  . Years of education: Not on file  . Highest education level: Not on file  Occupational History  . Not on file  Social Needs  . Financial resource strain: Not on file  . Food insecurity:    Worry: Not on file    Inability: Not on file  . Transportation needs:    Medical: Not on file    Non-medical: Not on file  Tobacco Use  . Smoking status: Former Smoker    Types: Cigarettes    Last attempt to quit: 04/27/1973    Years since quitting: 45.3  . Smokeless tobacco: Never Used  Substance and Sexual Activity  . Alcohol use: Yes    Alcohol/week: 14.0 standard drinks    Types: 14 Glasses of wine per week    Comment: 10-14 glasses of wine a week  . Drug use: No  . Sexual activity: Yes    Partners: Male    Birth control/protection: Surgical    Comment: vasectomy/TAH  Lifestyle  .  Physical activity:    Days per week: Not on file    Minutes per session: Not on file  . Stress: Not on file  Relationships  . Social connections:    Talks on phone: Not on file    Gets together: Not on file    Attends religious service: Not on file  Active member of club or organization: Not on file    Attends meetings of clubs or organizations: Not on file    Relationship status: Not on file  . Intimate partner violence:    Fear of current or ex partner: Not on file    Emotionally abused: Not on file    Physically abused: Not on file    Forced sexual activity: Not on file  Other Topics Concern  . Not on file  Social History Narrative  . Not on file    Review of Systems  PHYSICAL EXAMINATION:    LMP 06/15/2004 (Approximate)     General appearance: alert, cooperative and appears stated age  Chaperone was present for exam.  ASSESSMENT  Overactive bladder.  Intolerance to Toviaz and Ditropan XL.  Lack of improvement with physical therapy.   PLAN  Will recheck the Myrbetriq PA process.  We did discuss potential sinusitis like side effects with Myrbetriq.  If not approved, will refer to urology.    An After Visit Summary was printed and given to the patient.  __13____ minute consultation.

## 2018-09-06 ENCOUNTER — Ambulatory Visit: Payer: Medicare Other | Admitting: Obstetrics and Gynecology

## 2019-02-15 DIAGNOSIS — Z23 Encounter for immunization: Secondary | ICD-10-CM | POA: Diagnosis not present

## 2019-02-20 ENCOUNTER — Other Ambulatory Visit: Payer: Self-pay | Admitting: Obstetrics and Gynecology

## 2019-02-20 DIAGNOSIS — Z1231 Encounter for screening mammogram for malignant neoplasm of breast: Secondary | ICD-10-CM

## 2019-04-09 ENCOUNTER — Other Ambulatory Visit: Payer: Self-pay

## 2019-04-09 ENCOUNTER — Ambulatory Visit
Admission: RE | Admit: 2019-04-09 | Discharge: 2019-04-09 | Disposition: A | Discharge status: Home / Self Care | Payer: Medicare Other | Source: Ambulatory Visit | Attending: Obstetrics and Gynecology | Admitting: Obstetrics and Gynecology

## 2019-04-09 DIAGNOSIS — Z1231 Encounter for screening mammogram for malignant neoplasm of breast: Secondary | ICD-10-CM

## 2019-04-18 DIAGNOSIS — L738 Other specified follicular disorders: Secondary | ICD-10-CM | POA: Diagnosis not present

## 2019-04-18 DIAGNOSIS — D2271 Melanocytic nevi of right lower limb, including hip: Secondary | ICD-10-CM | POA: Diagnosis not present

## 2019-04-18 DIAGNOSIS — D1801 Hemangioma of skin and subcutaneous tissue: Secondary | ICD-10-CM | POA: Diagnosis not present

## 2019-04-18 DIAGNOSIS — Z419 Encounter for procedure for purposes other than remedying health state, unspecified: Secondary | ICD-10-CM | POA: Diagnosis not present

## 2019-04-18 DIAGNOSIS — L570 Actinic keratosis: Secondary | ICD-10-CM | POA: Diagnosis not present

## 2019-04-18 DIAGNOSIS — D692 Other nonthrombocytopenic purpura: Secondary | ICD-10-CM | POA: Diagnosis not present

## 2019-04-18 DIAGNOSIS — D225 Melanocytic nevi of trunk: Secondary | ICD-10-CM | POA: Diagnosis not present

## 2019-04-18 DIAGNOSIS — Z85828 Personal history of other malignant neoplasm of skin: Secondary | ICD-10-CM | POA: Diagnosis not present

## 2019-04-18 DIAGNOSIS — L918 Other hypertrophic disorders of the skin: Secondary | ICD-10-CM | POA: Diagnosis not present

## 2019-04-18 DIAGNOSIS — D2261 Melanocytic nevi of right upper limb, including shoulder: Secondary | ICD-10-CM | POA: Diagnosis not present

## 2019-04-18 DIAGNOSIS — L814 Other melanin hyperpigmentation: Secondary | ICD-10-CM | POA: Diagnosis not present

## 2019-05-16 ENCOUNTER — Ambulatory Visit: Payer: Medicare Other | Attending: Internal Medicine

## 2019-05-16 DIAGNOSIS — Z20828 Contact with and (suspected) exposure to other viral communicable diseases: Secondary | ICD-10-CM | POA: Diagnosis not present

## 2019-05-16 DIAGNOSIS — Z20822 Contact with and (suspected) exposure to covid-19: Secondary | ICD-10-CM

## 2019-05-17 LAB — NOVEL CORONAVIRUS, NAA: SARS-CoV-2, NAA: NOT DETECTED

## 2019-05-28 ENCOUNTER — Other Ambulatory Visit: Payer: Self-pay | Admitting: Obstetrics and Gynecology

## 2019-05-28 MED ORDER — MIRABEGRON ER 25 MG PO TB24: 25.0000 mg | ORAL_TABLET | Freq: Every day | ORAL | 2 refills | Status: DC

## 2019-05-28 NOTE — Telephone Encounter (Signed)
Patient would like refill on Myrbetriq. States it was not covered under insurance last year, but her RX coverage is now through [employer]. Next aex 08/03/19.

## 2019-05-28 NOTE — Telephone Encounter (Signed)
Medication refill request: myrbectriq Last AEX:  07/26/18  Next AEX: *08/03/19 Last MMG (if hormonal medication request): 04/10/19 Bi-rads 1 Neg  Refill authorized: #30 with 1 RF

## 2019-05-31 DIAGNOSIS — M20011 Mallet finger of right finger(s): Secondary | ICD-10-CM | POA: Diagnosis not present

## 2019-06-15 ENCOUNTER — Ambulatory Visit: Payer: 59

## 2019-06-21 ENCOUNTER — Telehealth: Payer: Self-pay | Admitting: Obstetrics and Gynecology

## 2019-06-21 NOTE — Telephone Encounter (Signed)
Left message on voicemail to call and reschedule cancelled appointment. °

## 2019-06-23 ENCOUNTER — Ambulatory Visit: Payer: 59

## 2019-06-26 ENCOUNTER — Ambulatory Visit: Payer: 59

## 2019-07-02 DIAGNOSIS — M25562 Pain in left knee: Secondary | ICD-10-CM | POA: Diagnosis not present

## 2019-07-08 ENCOUNTER — Ambulatory Visit: Payer: Medicare Other | Attending: Internal Medicine

## 2019-07-08 DIAGNOSIS — Z23 Encounter for immunization: Secondary | ICD-10-CM

## 2019-07-08 NOTE — Progress Notes (Signed)
   Covid-19 Vaccination Clinic  Name:  [name]    MRN: [id number] DOB: 1950/06/09  07/08/2019  [name] was observed post Covid-19 immunization for 15 minutes without incidence. She was provided with Vaccine Information Sheet and instruction to access the V-Safe system.   [name] was instructed to call 911 with any severe reactions post vaccine: Marland Kitchen Difficulty breathing  . Swelling of your face and throat  . A fast heartbeat  . A bad rash all over your body  . Dizziness and weakness    Immunizations Administered    Name Date Dose VIS Date Route   Pfizer COVID-19 Vaccine 07/08/2019 10:09 AM 0.3 mL 04/27/2019 Intramuscular   Manufacturer: [place]   Lot: [id number]   [place]: [id number]

## 2019-07-10 ENCOUNTER — Ambulatory Visit: Payer: Medicare Other | Attending: Internal Medicine

## 2019-07-10 DIAGNOSIS — Z20822 Contact with and (suspected) exposure to covid-19: Secondary | ICD-10-CM

## 2019-07-10 DIAGNOSIS — M79644 Pain in right finger(s): Secondary | ICD-10-CM | POA: Diagnosis not present

## 2019-07-10 DIAGNOSIS — M20011 Mallet finger of right finger(s): Secondary | ICD-10-CM | POA: Diagnosis not present

## 2019-07-11 LAB — NOVEL CORONAVIRUS, NAA: SARS-CoV-2, NAA: NOT DETECTED

## 2019-07-12 ENCOUNTER — Ambulatory Visit: Payer: 59

## 2019-07-24 DIAGNOSIS — M20011 Mallet finger of right finger(s): Secondary | ICD-10-CM | POA: Diagnosis not present

## 2019-07-24 DIAGNOSIS — M79644 Pain in right finger(s): Secondary | ICD-10-CM | POA: Diagnosis not present

## 2019-08-03 ENCOUNTER — Ambulatory Visit: Payer: Medicare Other | Admitting: Obstetrics and Gynecology

## 2019-08-03 DIAGNOSIS — M20011 Mallet finger of right finger(s): Secondary | ICD-10-CM | POA: Diagnosis not present

## 2019-08-06 ENCOUNTER — Ambulatory Visit (INDEPENDENT_AMBULATORY_CARE_PROVIDER_SITE_OTHER): Payer: Medicare Other | Admitting: Obstetrics and Gynecology

## 2019-08-06 ENCOUNTER — Other Ambulatory Visit: Payer: Self-pay

## 2019-08-06 ENCOUNTER — Encounter: Payer: Self-pay | Admitting: Obstetrics and Gynecology

## 2019-08-06 VITALS — BP 152/86 | HR 74 | Temp 98.4°F | Resp 14 | Ht 62.0 in | Wt 165.0 lb

## 2019-08-06 DIAGNOSIS — Z780 Asymptomatic menopausal state: Secondary | ICD-10-CM | POA: Diagnosis not present

## 2019-08-06 DIAGNOSIS — M8580 Other specified disorders of bone density and structure, unspecified site: Secondary | ICD-10-CM | POA: Diagnosis not present

## 2019-08-06 DIAGNOSIS — Z01419 Encounter for gynecological examination (general) (routine) without abnormal findings: Secondary | ICD-10-CM

## 2019-08-06 MED ORDER — MIRABEGRON ER 25 MG PO TB24: 25.0000 mg | ORAL_TABLET | Freq: Every day | ORAL | 11 refills | Status: DC

## 2019-08-06 MED ORDER — PREMARIN 0.625 MG/GM VA CREA: TOPICAL_CREAM | VAGINAL | 2 refills | Status: DC

## 2019-08-06 NOTE — Patient Instructions (Signed)

## 2019-08-06 NOTE — Progress Notes (Signed)
69 y.o. [id number] Married Caucasian female here for annual exam.    She is taking Myrbetriq for 2 months. Still waking up at night. Still has urge to get to bathroom on time.  No leakage if coughing or laughing.  Voiding well.   She takes her BP at home 122/63 this am.  She has some elevated BP at her PCP office but not when she checks it at home.    Want to check her vit D level.    PCP:   Donnie Coffin, MD   Patient's last menstrual period was 06/15/2004 (approximate).           Sexually active: Yes.    The current method of family planning is status post hysterectomy.    Exercising: Yes.    tennis, walking Smoker:  No- former smoker   Health Maintenance: Pap: 06/2005 Neg History of abnormal Pap:  no MMG:  04-09-19 3D/Neg/density C/BiRads1 Colonoscopy: 08-25-15 diverticulosis;f/u 10 yrs BMD: 03-28-17  Result : Osteopenia of hip TDaP: 10-09-13 Gardasil:   no HIV: 03/11/17 Negative Hep C:  03/24/15 negative Screening Labs:  Hb today: PCP, Urine today: not collected    reports that she quit smoking about 46 years ago. Her smoking use included cigarettes. She has never used smokeless tobacco. She reports current alcohol use of about 14.0 standard drinks of alcohol per week. She reports that she does not use drugs.  Past Medical History:  Diagnosis Date  . Bladder tumor    BENIGN  . Claustrophobia   . Elevated liver function tests 2016  . Gastritis determined by endoscopy 03/21/2017   Dr. Collene Mares  . GERD (gastroesophageal reflux disease)   . Hiatal hernia 05/29/15   CT scan of abdomen and pelvis  . Osteopenia 2018  . Sigmoid diverticulitis 05/29/15   CT scan of abdomen and pelvis  . Urinary urgency     Past Surgical History:  Procedure Laterality Date  . ABDOMINAL HYSTERECTOMY    . ABDOMINAL SACROCOLPOPEXY N/A 11/06/2013   Procedure: ABDOMINO SACROCOLPOPEXY with Halban's culdoplasty;  Surgeon: Jamey Reas de Berton Lan, MD;  Location: [facility];  Service:  Gynecology;  Laterality: N/A;  . ANTERIOR AND POSTERIOR REPAIR N/A 11/06/2013   Procedure: ANTERIOR (CYSTOCELE) AND POSTERIOR REPAIR (RECTOCELE);  Surgeon: Jamey Reas de Berton Lan, MD;  Location: [facility];  Service: Gynecology;  Laterality: N/A;  . ANTERIOR CRUCIATE LIGAMENT REPAIR Right   . BLADDER SUSPENSION N/A 11/06/2013   Procedure: TRANSVAGINAL TAPE (TVT) PROCEDURE exact midurethral sling;  Surgeon: Everardo All Amundson de Berton Lan, MD;  Location: [facility];  Service: Gynecology;  Laterality: N/A;  . BLADDER TUMOR EXCISION  1992   benign  . CYSTOSCOPY N/A 11/06/2013   Procedure: CYSTOSCOPY;  Surgeon: Jamey Reas de Berton Lan, MD;  Location: [facility];  Service: Gynecology;  Laterality: N/A;  . HIATAL HERNIA REPAIR N/A 05/27/2014   Procedure: LAPAROSCOPIC REPAIR OF HIATAL HERNIA WITH MESH;  Surgeon: Pedro Earls, MD;  Location: [facility];  Service: General;  Laterality: N/A;  . INSERTION OF MESH  05/27/2014   Procedure: INSERTION OF MESH;  Surgeon: Pedro Earls, MD;  Location: [facility];  Service: General;;  two different mesh used for two different sites  . SPIGELIAN HERNIA Right 05/27/2014   Procedure: RIGHT SPIGELIAN HERNIA REPAIR WITH MESH;  Surgeon: Pedro Earls, MD;  Location: [facility];  Service: General;  Laterality: Right;  . TOTAL ABDOMINAL HYSTERECTOMY W/ BILATERAL SALPINGOOPHORECTOMY  06/15/04   dermoid and AUB    Current Outpatient Medications  Medication Sig Dispense Refill  . acetaminophen (TYLENOL) 500 MG tablet Take 1,000 mg by mouth every 6 (six) hours as needed for fever (fever).    . Calcium Carbonate (CALTRATE 600 PO) Take by mouth.    . cholecalciferol (VITAMIN D) 1000 UNITS tablet Take 2,000 Units by mouth daily.     Marland Kitchen conjugated estrogens (PREMARIN) vaginal cream USE 1/2 GM VAGINALLY 2 TIMES/WK 30 g 0  . mirabegron ER (MYRBETRIQ) 25 MG TB24 tablet Take 1 tablet (25 mg total) by mouth daily. One po qd 30 tablet 2  . MOBIC 7.5 MG tablet Take 7.5  mg by mouth daily as needed.    . Multiple Vitamins-Minerals (CENTRUM SILVER PO) Take by mouth.    Marland Kitchen omeprazole (PRILOSEC) 40 MG capsule TAKE ONE CAPSULE EVERY DAY 20 MINUTES BEFORE BREAKFAST  9  . Probiotic Product ([place]) 170 MG CAPS Take 1 capsule by mouth daily as needed (stomach issues).     . triamcinolone (KENALOG) 0.025 % ointment Apply 1 application topically 2 (two) times daily. 30 g 1   No current facility-administered medications for this visit.    Family History  Problem Relation Age of Onset  . Sudden Cardiac Death Father 29       dued in his sleep, unkown cause  . Cancer Maternal Grandfather     Review of Systems  Constitutional: Negative.   HENT: Negative.   Eyes: Negative.   Respiratory: Negative.   Cardiovascular: Negative.   Gastrointestinal: Negative.   Endocrine: Negative.   Genitourinary: Negative.   Musculoskeletal: Negative.   Skin: Negative.   Allergic/Immunologic: Negative.   Neurological: Negative.   Hematological: Negative.   Psychiatric/Behavioral: Negative.     Exam:   BP (!) 152/86 (BP Location: Right Arm, Patient Position: Sitting, Cuff Size: Normal)   Pulse 74   Temp 98.4 F (36.9 C) (Skin)   Resp 14   Ht 5\' 2"  (1.575 m)   Wt 165 lb (74.8 kg)   LMP 06/15/2004 (Approximate)   BMI 30.18 kg/m     General appearance: alert, cooperative and appears stated age Head: normocephalic, without obvious abnormality, atraumatic Neck: no adenopathy, supple, symmetrical, trachea midline and thyroid normal to inspection and palpation Lungs: clear to auscultation bilaterally Breasts: normal appearance, no masses or tenderness, No nipple retraction or dimpling, No nipple discharge or bleeding, No axillary adenopathy Heart: regular rate and rhythm Abdomen: soft, non-tender; no masses, no organomegaly Extremities: extremities normal, atraumatic, no cyanosis or edema Skin: skin color, texture, turgor normal. No rashes or lesions Lymph  nodes: cervical, supraclavicular, and axillary nodes normal. Neurologic: grossly normal  Pelvic: External genitalia:  no lesions              No abnormal inguinal nodes palpated.              Urethra:  normal appearing urethra with no masses, tenderness or lesions              Bartholins and Skenes: normal                 Vagina: normal appearing vagina with normal color and discharge, no lesions.  Good support.  No mesh erosion in the vagina.               Cervix:absent               Pap taken: Yes.   Bimanual  Exam:  Uterus:  Absent.              Adnexa: no mass, fullness, tenderness              Rectal exam: Yes.  .  Confirms.              Anus:  normal sphincter tone, no lesions  Chaperone was present for exam.  Assessment:   Well woman visit with normal exam. Status post TAH/BSO.  Status post abdominosacrocolpopexy.  Status post hiatal hernia repair and abdominal wall hernia repair.  Off ERT.  Vaginal atrophy.  Hx low vit D. Overactive bladder. On Myrbetriq.  Elevated blood pressure.  Left nipple inversion (old change). Osteopenia.   Plan: Mammogram screening discussed. Self breast awareness reviewed. Pap and HR HPV as above. Guidelines for Calcium, Vitamin D, regular exercise program including cardiovascular and weight bearing exercise. Refill of Myrbetriq 25 mg dose.  She will monitor her BP at home. Refill of Premarin cream.   I discussed potential effect on breast cancer.  BMD this year. Medicare will not cover vit D.  She does not want to pay out of pocket.  Follow up annually and prn.   After visit summary provided.

## 2019-08-20 DIAGNOSIS — K219 Gastro-esophageal reflux disease without esophagitis: Secondary | ICD-10-CM | POA: Diagnosis not present

## 2019-08-20 DIAGNOSIS — Z0000 Encounter for general adult medical examination without abnormal findings: Secondary | ICD-10-CM | POA: Diagnosis not present

## 2019-08-20 DIAGNOSIS — E7800 Pure hypercholesterolemia, unspecified: Secondary | ICD-10-CM | POA: Diagnosis not present

## 2019-08-24 DIAGNOSIS — M20011 Mallet finger of right finger(s): Secondary | ICD-10-CM | POA: Diagnosis not present

## 2019-09-17 DIAGNOSIS — H02401 Unspecified ptosis of right eyelid: Secondary | ICD-10-CM | POA: Diagnosis not present

## 2019-10-22 ENCOUNTER — Other Ambulatory Visit: Payer: Self-pay | Admitting: Obstetrics and Gynecology

## 2019-10-22 DIAGNOSIS — E2839 Other primary ovarian failure: Secondary | ICD-10-CM

## 2019-10-24 ENCOUNTER — Ambulatory Visit
Admission: RE | Admit: 2019-10-24 | Discharge: 2019-10-24 | Disposition: A | Discharge status: Home / Self Care | Payer: Medicare Other | Source: Ambulatory Visit | Attending: Obstetrics and Gynecology | Admitting: Obstetrics and Gynecology

## 2019-10-24 ENCOUNTER — Other Ambulatory Visit: Payer: Self-pay

## 2019-10-24 ENCOUNTER — Other Ambulatory Visit: Payer: 59

## 2019-10-24 DIAGNOSIS — Z780 Asymptomatic menopausal state: Secondary | ICD-10-CM | POA: Diagnosis not present

## 2019-10-24 DIAGNOSIS — M85852 Other specified disorders of bone density and structure, left thigh: Secondary | ICD-10-CM | POA: Diagnosis not present

## 2019-10-24 DIAGNOSIS — E2839 Other primary ovarian failure: Secondary | ICD-10-CM

## 2020-01-29 DIAGNOSIS — R140 Abdominal distension (gaseous): Secondary | ICD-10-CM | POA: Diagnosis not present

## 2020-01-29 DIAGNOSIS — K219 Gastro-esophageal reflux disease without esophagitis: Secondary | ICD-10-CM | POA: Diagnosis not present

## 2020-01-29 DIAGNOSIS — K5730 Diverticulosis of large intestine without perforation or abscess without bleeding: Secondary | ICD-10-CM | POA: Diagnosis not present

## 2020-01-29 DIAGNOSIS — K582 Mixed irritable bowel syndrome: Secondary | ICD-10-CM | POA: Diagnosis not present

## 2020-02-08 DIAGNOSIS — Z23 Encounter for immunization: Secondary | ICD-10-CM | POA: Diagnosis not present

## 2020-03-06 ENCOUNTER — Other Ambulatory Visit: Payer: Self-pay | Admitting: Obstetrics and Gynecology

## 2020-03-06 DIAGNOSIS — Z1231 Encounter for screening mammogram for malignant neoplasm of breast: Secondary | ICD-10-CM

## 2020-03-16 DIAGNOSIS — Z23 Encounter for immunization: Secondary | ICD-10-CM | POA: Diagnosis not present

## 2020-04-14 ENCOUNTER — Ambulatory Visit
Admission: RE | Admit: 2020-04-14 | Discharge: 2020-04-14 | Disposition: A | Discharge status: Home / Self Care | Payer: Medicare Other | Source: Ambulatory Visit | Attending: Obstetrics and Gynecology | Admitting: Obstetrics and Gynecology

## 2020-04-14 ENCOUNTER — Other Ambulatory Visit: Payer: Self-pay

## 2020-04-14 DIAGNOSIS — Z1231 Encounter for screening mammogram for malignant neoplasm of breast: Secondary | ICD-10-CM

## 2020-04-16 DIAGNOSIS — H52223 Regular astigmatism, bilateral: Secondary | ICD-10-CM | POA: Diagnosis not present

## 2020-04-16 DIAGNOSIS — H5203 Hypermetropia, bilateral: Secondary | ICD-10-CM | POA: Diagnosis not present

## 2020-04-16 DIAGNOSIS — H25813 Combined forms of age-related cataract, bilateral: Secondary | ICD-10-CM | POA: Diagnosis not present

## 2020-04-16 DIAGNOSIS — H43393 Other vitreous opacities, bilateral: Secondary | ICD-10-CM | POA: Diagnosis not present

## 2020-04-16 DIAGNOSIS — H524 Presbyopia: Secondary | ICD-10-CM | POA: Diagnosis not present

## 2020-07-02 DIAGNOSIS — D1801 Hemangioma of skin and subcutaneous tissue: Secondary | ICD-10-CM | POA: Diagnosis not present

## 2020-07-02 DIAGNOSIS — L738 Other specified follicular disorders: Secondary | ICD-10-CM | POA: Diagnosis not present

## 2020-07-02 DIAGNOSIS — L814 Other melanin hyperpigmentation: Secondary | ICD-10-CM | POA: Diagnosis not present

## 2020-07-02 DIAGNOSIS — D225 Melanocytic nevi of trunk: Secondary | ICD-10-CM | POA: Diagnosis not present

## 2020-07-02 DIAGNOSIS — L821 Other seborrheic keratosis: Secondary | ICD-10-CM | POA: Diagnosis not present

## 2020-07-02 DIAGNOSIS — Z85828 Personal history of other malignant neoplasm of skin: Secondary | ICD-10-CM | POA: Diagnosis not present

## 2020-07-02 DIAGNOSIS — L570 Actinic keratosis: Secondary | ICD-10-CM | POA: Diagnosis not present

## 2020-07-06 ENCOUNTER — Other Ambulatory Visit: Payer: Self-pay | Admitting: Obstetrics and Gynecology

## 2020-07-07 NOTE — Telephone Encounter (Signed)
Medication refill request:  Premarine  Last AEX: 08/06/19  Next AEX: 09/17/20  Last MMG (if hormonal medication request): 04/14/20 density B Bi-rads 1 neg  Refill authorized: 30g with 2 Rf pended for today

## 2020-07-30 ENCOUNTER — Other Ambulatory Visit: Payer: Self-pay | Admitting: Obstetrics and Gynecology

## 2020-07-30 NOTE — Telephone Encounter (Signed)
Last AEX 08/06/19 Scheduled AEX 09/17/20

## 2020-08-15 DIAGNOSIS — M79644 Pain in right finger(s): Secondary | ICD-10-CM | POA: Diagnosis not present

## 2020-09-01 DIAGNOSIS — E7800 Pure hypercholesterolemia, unspecified: Secondary | ICD-10-CM | POA: Diagnosis not present

## 2020-09-01 DIAGNOSIS — Z0000 Encounter for general adult medical examination without abnormal findings: Secondary | ICD-10-CM | POA: Diagnosis not present

## 2020-09-01 DIAGNOSIS — Z23 Encounter for immunization: Secondary | ICD-10-CM | POA: Diagnosis not present

## 2020-09-01 DIAGNOSIS — K219 Gastro-esophageal reflux disease without esophagitis: Secondary | ICD-10-CM | POA: Diagnosis not present

## 2020-09-16 NOTE — Progress Notes (Signed)
GYNECOLOGY  VISIT   HPI: 70 y.o.   Married  Caucasian  female   [phone] with Patient's last menstrual period was 06/15/2004 (approximate).   here for breast and pelvic exam.    Patient is followed for overactive bladder, vaginal atrophy, and osteopenia.   Taking Myrbetriq and wants to increase dosage.  She can have urgency at home. Not having as many urinary incontinence episodes.  All beverages are decaff.   She monitors her blood pressure at home. Usually 115 - 120/60s - 70s.   Wants to continue Premarin vaginal cream.   BMD 10/24/19 showing osteopenia of left hip.   GYNECOLOGIC HISTORY: Patient's last menstrual period was 06/15/2004 (approximate). Contraception:  Hyst Menopausal hormone therapy:  Premarin Vag Cream Last mammogram: 04-14-20 3D/Neg/Birads1 Last pap smear:  06/2005 Neg        OB History    Gravida  3   Para  3   Term  3   Preterm      AB      Living  3     SAB      IAB      Ectopic      Multiple      Live Births                 Patient Active Problem List   Diagnosis Date Noted  . Overactive bladder 09/05/2018  . Elevated troponin 03/11/2017  . Epigastric pain 03/11/2017  . Anemia due to acute blood loss 06/03/2014  . Status post Nissen fundoplication and repair of large hiatal hernia Jan 2016 05/30/2014    Past Medical History:  Diagnosis Date  . Bladder tumor    BENIGN  . Claustrophobia   . Elevated liver function tests 2016  . Gastritis determined by endoscopy 03/21/2017   Dr. Collene Mares  . GERD (gastroesophageal reflux disease)   . Hiatal hernia 05/29/15   CT scan of abdomen and pelvis  . Osteopenia 2018  . Sigmoid diverticulitis 05/29/15   CT scan of abdomen and pelvis  . Urinary urgency     Past Surgical History:  Procedure Laterality Date  . ABDOMINAL HYSTERECTOMY    . ABDOMINAL SACROCOLPOPEXY N/A 11/06/2013   Procedure: ABDOMINO SACROCOLPOPEXY with Halban's culdoplasty;  Surgeon: Jamey Reas de Berton Lan,  MD;  Location: [facility];  Service: Gynecology;  Laterality: N/A;  . ANTERIOR AND POSTERIOR REPAIR N/A 11/06/2013   Procedure: ANTERIOR (CYSTOCELE) AND POSTERIOR REPAIR (RECTOCELE);  Surgeon: Jamey Reas de Berton Lan, MD;  Location: [facility];  Service: Gynecology;  Laterality: N/A;  . ANTERIOR CRUCIATE LIGAMENT REPAIR Right   . BLADDER SUSPENSION N/A 11/06/2013   Procedure: TRANSVAGINAL TAPE (TVT) PROCEDURE exact midurethral sling;  Surgeon: Everardo All Amundson de Berton Lan, MD;  Location: [facility];  Service: Gynecology;  Laterality: N/A;  . BLADDER TUMOR EXCISION  1992   benign  . CYSTOSCOPY N/A 11/06/2013   Procedure: CYSTOSCOPY;  Surgeon: Jamey Reas de Berton Lan, MD;  Location: [facility];  Service: Gynecology;  Laterality: N/A;  . HIATAL HERNIA REPAIR N/A 05/27/2014   Procedure: LAPAROSCOPIC REPAIR OF HIATAL HERNIA WITH MESH;  Surgeon: Pedro Earls, MD;  Location: [facility];  Service: General;  Laterality: N/A;  . INSERTION OF MESH  05/27/2014   Procedure: INSERTION OF MESH;  Surgeon: Pedro Earls, MD;  Location: [facility];  Service: General;;  two different mesh used for two different sites  . SPIGELIAN HERNIA Right 05/27/2014  Procedure: RIGHT SPIGELIAN HERNIA REPAIR WITH MESH;  Surgeon: Pedro Earls, MD;  Location: [facility];  Service: General;  Laterality: Right;  . TOTAL ABDOMINAL HYSTERECTOMY W/ BILATERAL SALPINGOOPHORECTOMY  06/15/04   dermoid and AUB    Current Outpatient Medications  Medication Sig Dispense Refill  . acetaminophen (TYLENOL) 500 MG tablet Take 1,000 mg by mouth every 6 (six) hours as needed for fever (fever).    . Calcium Carbonate (CALTRATE 600 PO) Take by mouth.    . cholecalciferol (VITAMIN D) 1000 UNITS tablet Take 2,000 Units by mouth daily.     . Multiple Vitamins-Minerals (CENTRUM SILVER PO) Take by mouth.    Marland Kitchen MYRBETRIQ 25 MG TB24 tablet TAKE 1 TABLET BY MOUTH EVERY DAY 30 tablet 2  . omeprazole (PRILOSEC) 40 MG capsule 1 capsule    .  PREMARIN vaginal cream USE 1/2 GM VAGINALLY 2 TIMES/WK 30 g 0  . Probiotic Product ([place]) 170 MG CAPS Take 1 capsule by mouth daily as needed (stomach issues).     . triamcinolone (KENALOG) 0.025 % ointment Apply 1 application topically 2 (two) times daily. 30 g 1   No current facility-administered medications for this visit.     ALLERGIES: Sulfa antibiotics  Family History  Problem Relation Age of Onset  . Sudden Cardiac Death Father 33       dued in his sleep, unkown cause  . Cancer Maternal Grandfather     Social History   Socioeconomic History  . Marital status: Married    Spouse name: Not on file  . Number of children: 3  . Years of education: Not on file  . Highest education level: Not on file  Occupational History  . Not on file  Tobacco Use  . Smoking status: Former Smoker    Types: Cigarettes    Quit date: 04/27/1973    Years since quitting: 47.4  . Smokeless tobacco: Never Used  Vaping Use  . Vaping Use: Never used  Substance and Sexual Activity  . Alcohol use: Yes    Alcohol/week: 14.0 standard drinks    Types: 14 Glasses of wine per week    Comment: 10-14 glasses of wine a week  . Drug use: No  . Sexual activity: Yes    Partners: Male    Birth control/protection: Surgical    Comment: vasectomy/TAH  Other Topics Concern  . Not on file  Social History Narrative  . Not on file   Social Determinants of Health   Financial Resource Strain: Not on file  Food Insecurity: Not on file  Transportation Needs: Not on file  Physical Activity: Not on file  Stress: Not on file  Social Connections: Not on file  Intimate Partner Violence: Not on file    Review of Systems  All other systems reviewed and are negative.   PHYSICAL EXAMINATION:    BP (!) 156/82 (Cuff Size: Large)   Pulse 69   Ht 5\' 2"  (1.575 m)   Wt 170 lb (77.1 kg)   LMP 06/15/2004 (Approximate)   SpO2 100%   BMI 31.09 kg/m     General appearance: alert, cooperative  and appears stated age Head: Normocephalic, without obvious abnormality, atraumatic Neck: no adenopathy, supple, symmetrical, trachea midline and thyroid normal to inspection and palpation Lungs: clear to auscultation bilaterally Breasts: normal appearance, no masses or tenderness, No nipple retraction or dimpling on right and nipple inversion present on left, No nipple discharge or bleeding, No axillary or supraclavicular adenopathy Heart:  regular rate and rhythm Abdomen: soft, non-tender, no masses,  no organomegaly Extremities: extremities normal, atraumatic, no cyanosis or edema Skin: Skin color, texture, turgor normal. No rashes or lesions Lymph nodes: Cervical, supraclavicular, and axillary nodes normal. No abnormal inguinal nodes palpated Neurologic: Grossly normal  Pelvic: External genitalia:  no lesions              Urethra:  normal appearing urethra with no masses, tenderness or lesions              Bartholins and Skenes: normal                 Vagina: normal appearing vagina with normal color and discharge, no lesions.  Good support and mesh protected.               Cervix: absent                Bimanual Exam:  Uterus:  Absent.              Adnexa: no mass, fullness, tenderness              Rectal exam: Yes.  .  Confirms.              Anus:  normal sphincter tone, no lesions  Chaperone was present for exam.  ASSESSMENT  Status post TAH/BSO.  Status post abdominosacrocolpopexy.  Status post hiatal hernia repair and abdominal wall hernia repair. OffERT.  Pelvic exam with abnormal findings absent.  Hx vaginal atrophy treated with local vaginal estrogen.  Overactive bladder.On Myrbetriq. Elevated blood pressure.  Screening breast exam. Left nipple inversion (old change). Osteopenia.  Medication monitoring visit.   PLAN  Continue Premarin vaginal cream 1/2 gram pv at hs twice weekly.  I discussed potential effect of local estrogen on breast cancer.  Yearly  mammogram recommended.  Self breast exam encouraged. Increase Myrbetriq to 50 mg daily.  She will check her BP at home three times weekly and bring to her office visit follow up. Fu in 6 weeks for a recheck.  BMD in 2023.  We reviewed calcium and vit D intake and weight bearing exercise to reduce risk of osteoporosis.   31 min  total time was spent for this patient encounter, including preparation, face-to-face counseling with the patient, coordination of care, and documentation of the encounter.

## 2020-09-17 ENCOUNTER — Ambulatory Visit (INDEPENDENT_AMBULATORY_CARE_PROVIDER_SITE_OTHER): Payer: PPO | Admitting: Obstetrics and Gynecology

## 2020-09-17 ENCOUNTER — Encounter: Payer: Self-pay | Admitting: Obstetrics and Gynecology

## 2020-09-17 ENCOUNTER — Other Ambulatory Visit: Payer: Self-pay

## 2020-09-17 VITALS — BP 156/82 | HR 69 | Ht 62.0 in | Wt 170.0 lb

## 2020-09-17 DIAGNOSIS — Z5181 Encounter for therapeutic drug level monitoring: Secondary | ICD-10-CM | POA: Diagnosis not present

## 2020-09-17 DIAGNOSIS — N3281 Overactive bladder: Secondary | ICD-10-CM | POA: Diagnosis not present

## 2020-09-17 DIAGNOSIS — Z1239 Encounter for other screening for malignant neoplasm of breast: Secondary | ICD-10-CM

## 2020-09-17 DIAGNOSIS — N952 Postmenopausal atrophic vaginitis: Secondary | ICD-10-CM | POA: Diagnosis not present

## 2020-09-17 DIAGNOSIS — Z01419 Encounter for gynecological examination (general) (routine) without abnormal findings: Secondary | ICD-10-CM | POA: Diagnosis not present

## 2020-09-17 MED ORDER — PREMARIN 0.625 MG/GM VA CREA: TOPICAL_CREAM | VAGINAL | 1 refills | Status: DC

## 2020-09-17 MED ORDER — MIRABEGRON ER 50 MG PO TB24: 50.0000 mg | ORAL_TABLET | Freq: Every day | ORAL | 1 refills | Status: DC

## 2020-10-15 NOTE — Progress Notes (Signed)
GYNECOLOGY  VISIT   HPI: 70 y.o.   Married Caucasian female   [phone] with Patient's last menstrual period was 06/15/2004 (approximate).   here for  6 week follow up on overactive bladder.   Taking Myrbetriq and desired to increase dosage at her last visit on 09/17/20.  She was having urgency at home.  This prompted her dosage to be increased from 25 - 50 mg.  She has not noticed a difference yet, but does acknowledge she started this a week after she was here because she did not know her prescription was ready. She is not having as much urgency now. When she is busy, she does not need to empty as often.  Up twice at night.   Does note wear a pad.  No leakage with a cough, sneeze or exercise.   Doing fluid restriction before walking.   Drinks decaf tea for breakfast.   Some constipation.   Using vaginal estrogen cream.   BP at home:  104-139/62-74 at home.  Brings in her recordings from home.  Has white coat syndrome when she goes in to her doctor's offices.  She denies headaches or lightheadedness.   Her blood pressure was 156/82 at her last visit here.   GYNECOLOGIC HISTORY: Patient's last menstrual period was 06/15/2004 (approximate). Contraception:  Hyst Menopausal hormone therapy:  Premarin vaginal cream Last mammogram:   04-14-20 3D/Neg/Birads1 Last pap smear:  06/2005 Neg        OB History     Gravida  3   Para  3   Term  3   Preterm      AB      Living  3      SAB      IAB      Ectopic      Multiple      Live Births                 Patient Active Problem List   Diagnosis Date Noted   Overactive bladder 09/05/2018   Elevated troponin 03/11/2017   Epigastric pain 03/11/2017   Anemia due to acute blood loss 06/03/2014   Status post Nissen fundoplication and repair of large hiatal hernia Jan 2016 05/30/2014    Past Medical History:  Diagnosis Date   Bladder tumor    BENIGN   Claustrophobia    Elevated liver function tests 2016    Gastritis determined by endoscopy 03/21/2017   Dr. Collene Mares   GERD (gastroesophageal reflux disease)    Hiatal hernia 05/29/15   CT scan of abdomen and pelvis   Osteopenia 2018   Sigmoid diverticulitis 05/29/15   CT scan of abdomen and pelvis   Urinary urgency     Past Surgical History:  Procedure Laterality Date   ABDOMINAL HYSTERECTOMY     ABDOMINAL SACROCOLPOPEXY N/A 11/06/2013   Procedure: ABDOMINO SACROCOLPOPEXY with Halban's culdoplasty;  Surgeon: Jamey Reas de Berton Lan, MD;  Location: [facility];  Service: Gynecology;  Laterality: N/A;   ANTERIOR AND POSTERIOR REPAIR N/A 11/06/2013   Procedure: ANTERIOR (CYSTOCELE) AND POSTERIOR REPAIR (RECTOCELE);  Surgeon: Jamey Reas de Berton Lan, MD;  Location: [facility];  Service: Gynecology;  Laterality: N/A;   ANTERIOR CRUCIATE LIGAMENT REPAIR Right    BLADDER SUSPENSION N/A 11/06/2013   Procedure: TRANSVAGINAL TAPE (TVT) PROCEDURE exact midurethral sling;  Surgeon: Jamey Reas de Berton Lan, MD;  Location: [facility];  Service: Gynecology;  Laterality: N/A;   BLADDER TUMOR  EXCISION  1992   benign   CYSTOSCOPY N/A 11/06/2013   Procedure: CYSTOSCOPY;  Surgeon: Jamey Reas de Berton Lan, MD;  Location: [facility];  Service: Gynecology;  Laterality: N/A;   HIATAL HERNIA REPAIR N/A 05/27/2014   Procedure: LAPAROSCOPIC REPAIR OF HIATAL HERNIA WITH MESH;  Surgeon: Pedro Earls, MD;  Location: [facility];  Service: General;  Laterality: N/A;   INSERTION OF MESH  05/27/2014   Procedure: INSERTION OF MESH;  Surgeon: Pedro Earls, MD;  Location: [facility];  Service: General;;  two different mesh used for two different sites   [place] Right 05/27/2014   Procedure: RIGHT SPIGELIAN HERNIA REPAIR WITH MESH;  Surgeon: Pedro Earls, MD;  Location: [facility];  Service: General;  Laterality: Right;   TOTAL ABDOMINAL HYSTERECTOMY W/ BILATERAL SALPINGOOPHORECTOMY  06/15/04   dermoid and AUB    Current Outpatient Medications   Medication Sig Dispense Refill   acetaminophen (TYLENOL) 500 MG tablet Take 1,000 mg by mouth every 6 (six) hours as needed for fever (fever).     Calcium Carbonate (CALTRATE 600 PO) Take by mouth.     cholecalciferol (VITAMIN D) 1000 UNITS tablet Take 2,000 Units by mouth daily.      conjugated estrogens (PREMARIN) vaginal cream Place 1/2 gram in the vagina twice weekly. 30 g 1   mirabegron ER (MYRBETRIQ) 50 MG TB24 tablet Take 1 tablet (50 mg total) by mouth daily. 30 tablet 1   Multiple Vitamins-Minerals (CENTRUM SILVER PO) Take by mouth.     omeprazole (PRILOSEC) 40 MG capsule 1 capsule     Probiotic Product (ULTRAFLORA [employer]) 170 MG CAPS Take 1 capsule by mouth daily as needed (stomach issues).      triamcinolone (KENALOG) 0.025 % ointment Apply 1 application topically 2 (two) times daily. 30 g 1   No current facility-administered medications for this visit.     ALLERGIES: Sulfa antibiotics  Family History  Problem Relation Age of Onset   Sudden Cardiac Death Father 64       dued in his sleep, unkown cause   Cancer Maternal Grandfather     Social History   Socioeconomic History   Marital status: Married    Spouse name: Not on file   Number of children: 3   Years of education: Not on file   Highest education level: Not on file  Occupational History   Not on file  Tobacco Use   Smoking status: Former    Pack years: 0.00    Types: Cigarettes    Quit date: 04/27/1973    Years since quitting: 47.5   Smokeless tobacco: Never  Vaping Use   Vaping Use: Never used  Substance and Sexual Activity   Alcohol use: Yes    Alcohol/week: 14.0 standard drinks    Types: 14 Glasses of wine per week    Comment: 10-14 glasses of wine a week   Drug use: No   Sexual activity: Yes    Partners: Male    Birth control/protection: Surgical    Comment: vasectomy/TAH  Other Topics Concern   Not on file  Social History Narrative   Not on file   Social Determinants of Health    Financial Resource Strain: Not on file  Food Insecurity: Not on file  Transportation Needs: Not on file  Physical Activity: Not on file  Stress: Not on file  Social Connections: Not on file  Intimate Partner Violence: Not on file    Review  of Systems  Constitutional: Negative.   HENT: Negative.    Eyes: Negative.   Respiratory: Negative.    Cardiovascular: Negative.   Gastrointestinal: Negative.   Endocrine: Negative.   Genitourinary: Negative.   Musculoskeletal: Negative.   Skin: Negative.   Allergic/Immunologic: Negative.   Neurological: Negative.   Hematological: Negative.   Psychiatric/Behavioral: Negative.     PHYSICAL EXAMINATION:    BP 140/90   Pulse 71   LMP 06/15/2004 (Approximate)   SpO2 99%     General appearance: alert, cooperative and appears stated age   ASSESSMENT  Overactive bladder.  Doing well on Mybetriq.  BP appears controlled.   PLAN  Refill of Myrbetriq 50 mg daily until annual exam is due.  She will contact me if she does not feel improved after 3 months.  I did mention additional treatments for overactive bladder through urology - PTNS, pelvic floor therapy.  Fu yearly and prn.    25 min total time was spent for this patient encounter, including preparation, face-to-face counseling with the patient, coordination of care, and documentation of the encounter.

## 2020-10-29 ENCOUNTER — Other Ambulatory Visit: Payer: Self-pay

## 2020-10-29 ENCOUNTER — Encounter: Payer: Self-pay | Admitting: Obstetrics and Gynecology

## 2020-10-29 ENCOUNTER — Ambulatory Visit: Payer: PPO | Admitting: Obstetrics and Gynecology

## 2020-10-29 VITALS — BP 140/90 | HR 71

## 2020-10-29 DIAGNOSIS — N3281 Overactive bladder: Secondary | ICD-10-CM | POA: Diagnosis not present

## 2020-10-29 MED ORDER — MIRABEGRON ER 50 MG PO TB24: 50.0000 mg | ORAL_TABLET | Freq: Every day | ORAL | 10 refills | Status: DC

## 2020-10-29 NOTE — Patient Instructions (Signed)
Please let me know if you are not feeling your bladder control is improved.  I would be happy to make a referral to a urologist.

## 2021-03-02 DIAGNOSIS — E7800 Pure hypercholesterolemia, unspecified: Secondary | ICD-10-CM | POA: Diagnosis not present

## 2021-03-02 DIAGNOSIS — Z23 Encounter for immunization: Secondary | ICD-10-CM | POA: Diagnosis not present

## 2021-03-03 DIAGNOSIS — K219 Gastro-esophageal reflux disease without esophagitis: Secondary | ICD-10-CM | POA: Diagnosis not present

## 2021-03-03 DIAGNOSIS — E669 Obesity, unspecified: Secondary | ICD-10-CM | POA: Diagnosis not present

## 2021-03-03 DIAGNOSIS — K625 Hemorrhage of anus and rectum: Secondary | ICD-10-CM | POA: Diagnosis not present

## 2021-03-03 DIAGNOSIS — K5730 Diverticulosis of large intestine without perforation or abscess without bleeding: Secondary | ICD-10-CM | POA: Diagnosis not present

## 2021-03-26 ENCOUNTER — Other Ambulatory Visit: Payer: Self-pay | Admitting: Obstetrics and Gynecology

## 2021-03-26 DIAGNOSIS — Z1231 Encounter for screening mammogram for malignant neoplasm of breast: Secondary | ICD-10-CM

## 2021-04-30 ENCOUNTER — Ambulatory Visit
Admission: RE | Admit: 2021-04-30 | Discharge: 2021-04-30 | Disposition: A | Discharge status: Home / Self Care | Payer: PPO | Source: Ambulatory Visit | Attending: Obstetrics and Gynecology | Admitting: Obstetrics and Gynecology

## 2021-04-30 DIAGNOSIS — Z1231 Encounter for screening mammogram for malignant neoplasm of breast: Secondary | ICD-10-CM

## 2021-06-01 DIAGNOSIS — D1801 Hemangioma of skin and subcutaneous tissue: Secondary | ICD-10-CM | POA: Diagnosis not present

## 2021-06-01 DIAGNOSIS — D0359 Melanoma in situ of other part of trunk: Secondary | ICD-10-CM | POA: Diagnosis not present

## 2021-06-01 DIAGNOSIS — D0372 Melanoma in situ of left lower limb, including hip: Secondary | ICD-10-CM | POA: Diagnosis not present

## 2021-06-01 DIAGNOSIS — D235 Other benign neoplasm of skin of trunk: Secondary | ICD-10-CM | POA: Diagnosis not present

## 2021-06-01 DIAGNOSIS — L570 Actinic keratosis: Secondary | ICD-10-CM | POA: Diagnosis not present

## 2021-06-01 DIAGNOSIS — I788 Other diseases of capillaries: Secondary | ICD-10-CM | POA: Diagnosis not present

## 2021-06-01 DIAGNOSIS — Z85828 Personal history of other malignant neoplasm of skin: Secondary | ICD-10-CM | POA: Diagnosis not present

## 2021-06-01 DIAGNOSIS — L821 Other seborrheic keratosis: Secondary | ICD-10-CM | POA: Diagnosis not present

## 2021-06-01 DIAGNOSIS — D225 Melanocytic nevi of trunk: Secondary | ICD-10-CM | POA: Diagnosis not present

## 2021-06-30 DIAGNOSIS — M25512 Pain in left shoulder: Secondary | ICD-10-CM | POA: Diagnosis not present

## 2021-06-30 DIAGNOSIS — M79671 Pain in right foot: Secondary | ICD-10-CM | POA: Diagnosis not present

## 2021-07-02 DIAGNOSIS — L988 Other specified disorders of the skin and subcutaneous tissue: Secondary | ICD-10-CM | POA: Diagnosis not present

## 2021-07-02 DIAGNOSIS — Z85828 Personal history of other malignant neoplasm of skin: Secondary | ICD-10-CM | POA: Diagnosis not present

## 2021-07-02 DIAGNOSIS — D0372 Melanoma in situ of left lower limb, including hip: Secondary | ICD-10-CM | POA: Diagnosis not present

## 2021-08-30 ENCOUNTER — Other Ambulatory Visit: Payer: Self-pay

## 2021-08-30 ENCOUNTER — Encounter (HOSPITAL_BASED_OUTPATIENT_CLINIC_OR_DEPARTMENT_OTHER): Payer: Self-pay | Admitting: Emergency Medicine

## 2021-08-30 ENCOUNTER — Emergency Department (HOSPITAL_BASED_OUTPATIENT_CLINIC_OR_DEPARTMENT_OTHER): Payer: PPO | Admitting: Radiology

## 2021-08-30 ENCOUNTER — Emergency Department (HOSPITAL_BASED_OUTPATIENT_CLINIC_OR_DEPARTMENT_OTHER)
Admission: EM | Admit: 2021-08-30 | Discharge: 2021-08-30 | Disposition: A | Discharge status: Home / Self Care | Payer: PPO | Attending: Emergency Medicine | Admitting: Emergency Medicine

## 2021-08-30 DIAGNOSIS — X509XXA Other and unspecified overexertion or strenuous movements or postures, initial encounter: Secondary | ICD-10-CM | POA: Insufficient documentation

## 2021-08-30 DIAGNOSIS — S2232XA Fracture of one rib, left side, initial encounter for closed fracture: Secondary | ICD-10-CM

## 2021-08-30 DIAGNOSIS — R0789 Other chest pain: Secondary | ICD-10-CM | POA: Diagnosis not present

## 2021-08-30 DIAGNOSIS — R0781 Pleurodynia: Secondary | ICD-10-CM | POA: Diagnosis not present

## 2021-08-30 DIAGNOSIS — S299XXA Unspecified injury of thorax, initial encounter: Secondary | ICD-10-CM | POA: Diagnosis present

## 2021-08-30 MED ORDER — OXYCODONE-ACETAMINOPHEN 5-325 MG PO TABS: 1.0000 | ORAL_TABLET | Freq: Three times a day (TID) | ORAL | 0 refills | Status: AC | PRN

## 2021-08-30 MED ORDER — LIDOCAINE 5 % EX PTCH
1.0000 | MEDICATED_PATCH | CUTANEOUS | Status: DC
  Administered 2021-08-30: 1 via TRANSDERMAL
  Filled 2021-08-30: qty 1

## 2021-08-30 MED ORDER — LIDOCAINE 4 % EX PTCH: 1.0000 | MEDICATED_PATCH | Freq: Two times a day (BID) | CUTANEOUS | 0 refills | Status: DC | PRN

## 2021-08-30 MED ORDER — MORPHINE SULFATE (PF) 4 MG/ML IV SOLN: 4.0000 mg | Freq: Once | INTRAVENOUS | Status: DC

## 2021-08-30 MED ORDER — ONDANSETRON HCL 4 MG/2ML IJ SOLN: 4.0000 mg | Freq: Once | INTRAMUSCULAR | Status: DC

## 2021-08-30 NOTE — ED Triage Notes (Signed)
Pt c/o hard pop sound to left rib area after putting grandson into crib on Wednesday. ?

## 2021-08-30 NOTE — ED Notes (Signed)
Left side of chest, heard/felt a pop. No obvious injury. ?

## 2021-08-30 NOTE — ED Notes (Signed)
Discharge paperwork given and understood. 

## 2021-08-30 NOTE — Discharge Instructions (Addendum)
You came to the ED today to be evaluated for your left chest wall pain.  Your x-ray showed that you have a nondisplaced left seventh rib fracture.  Due to this I have given you prescription for lidocaine patches and Percocet.  Please use the incentive spirometer we gave you in the emergency department as discussed.  Please follow-up with your primary care doctor for repeat evaluation. ? ?Get help right away if: ?You have difficulty breathing or you are short of breath. ?You develop a cough that does not stop, or you cough up thick or bloody sputum. ?You have nausea, vomiting, or pain in your abdomen. ?Your pain gets worse and medicine does not help. ?

## 2021-08-30 NOTE — ED Provider Notes (Signed)
?[facility] ?Provider Note ? ? ?CSN: [id number] ?Arrival date & time: 08/30/21  1117 ? ?  ? ?History ? ?Chief Complaint  ?Patient presents with  ? Rib Injury  ? ? ?[name] is a 71 y.o. female with past medical history of osteopenia, gastritis, hiatal hernia, GERD.  Presents to the emergency department with a complaint of left chest wall pain.  Patient states that on Thursday she put down her grandson in a crib and felt a "pop," to the left side of her chest wall.  Patient states that she has had pain to the left side of her chest wall since this incident.  Pain is worse with touch and movement.  Patient took NSAID medication with improvement in her pain.  Patient has not taken any medications today to help alleviate her pain. ? ?Patient denies any shortness of breath, hemoptysis, palpitations, leg swelling or tenderness, abdominal pain, nausea, vomiting, lightheadedness, syncope, fever, chills. ? ?HPI ? ?  ? ?Home Medications ?Prior to Admission medications   ?Medication Sig Start Date End Date Taking? Authorizing Provider  ?acetaminophen (TYLENOL) 500 MG tablet Take 1,000 mg by mouth every 6 (six) hours as needed for fever (fever).    [provider]  ?Calcium Carbonate (CALTRATE 600 PO) Take by mouth.    [provider]  ?cholecalciferol (VITAMIN D) 1000 UNITS tablet Take 2,000 Units by mouth daily.     [provider]  ?conjugated estrogens (PREMARIN) vaginal cream Place 1/2 gram in the vagina twice weekly. 09/17/20   Nunzio Cobbs, MD  ?mirabegron ER (MYRBETRIQ) 50 MG TB24 tablet Take 1 tablet (50 mg total) by mouth daily. 10/29/20   Nunzio Cobbs, MD  ?Multiple Vitamins-Minerals (CENTRUM SILVER PO) Take by mouth.    [provider]  ?omeprazole (PRILOSEC) 40 MG capsule 1 capsule    [provider]  ?Probiotic Product ([place]) 170 MG CAPS Take 1 capsule by mouth daily as needed (stomach  issues).     [provider]  ?triamcinolone (KENALOG) 0.025 % ointment Apply 1 application topically 2 (two) times daily. 07/26/18   Nunzio Cobbs, MD  ?   ? ?Allergies    ?Sulfa antibiotics   ? ?Review of Systems   ?Review of Systems  ?Constitutional:  Negative for chills and fever.  ?Respiratory:  Negative for shortness of breath.   ?Cardiovascular:  Positive for chest pain. Negative for palpitations and leg swelling.  ?Gastrointestinal:  Negative for abdominal pain, nausea and vomiting.  ?Musculoskeletal:  Negative for back pain and neck pain.  ?Skin:  Negative for color change and rash.  ?Neurological:  Negative for dizziness, syncope, light-headedness and headaches.  ?Psychiatric/Behavioral:  Negative for confusion.   ? ?Physical Exam ?Updated Vital Signs ?BP (!) 190/98 (BP Location: Right Arm)   Pulse 81   Temp 98.5 ?F (36.9 ?C) (Oral)   Resp 18   Ht '5\' 3"'$  (1.6 m)   Wt 77.1 kg   LMP 06/15/2004 (Approximate)   SpO2 98%   BMI 30.11 kg/m?  ?Physical Exam ?Vitals and nursing note reviewed.  ?Constitutional:   ?   General: She is not in acute distress. ?   Appearance: She is not ill-appearing, toxic-appearing or diaphoretic.  ?HENT:  ?   Head: Normocephalic.  ?Eyes:  ?   General: No scleral icterus.    ?   Right eye: No discharge.     ?   Left  eye: No discharge.  ?Cardiovascular:  ?   Rate and Rhythm: Normal rate.  ?   Pulses:     ?     Radial pulses are 2+ on the right side and 2+ on the left side.  ?Pulmonary:  ?   Effort: Pulmonary effort is normal. No tachypnea, bradypnea or respiratory distress.  ?   Breath sounds: Normal breath sounds. No stridor.  ?Chest:  ?   Chest wall: Tenderness present. No mass, lacerations, deformity, swelling or crepitus.  ? ? ?   Comments: Tenderness to left chest wall as indicated above ?Abdominal:  ?   General: Abdomen is flat. There is no distension. There are no signs of injury.  ?   Palpations: Abdomen is soft. There is no mass or pulsatile mass.  ?    Tenderness: There is no abdominal tenderness. There is no guarding or rebound.  ?Skin: ?   General: Skin is warm and dry.  ?Neurological:  ?   General: No focal deficit present.  ?   Mental Status: She is alert.  ?   GCS: GCS eye subscore is 4. GCS verbal subscore is 5. GCS motor subscore is 6.  ?Psychiatric:     ?   Behavior: Behavior is cooperative.  ? ? ?ED Results / Procedures / Treatments   ?Labs ?(all labs ordered are listed, but only abnormal results are displayed) ?Labs Reviewed - No data to display ? ?EKG ?EKG Interpretation ? ?Date/Time:  Sunday August 30 2021 12:56:12 EDT ?Ventricular Rate:  69 ?PR Interval:  168 ?QRS Duration: 107 ?QT Interval:  438 ?QTC Calculation: 470 ?R Axis:   60 ?Text Interpretation: Sinus rhythm Consider left atrial enlargement when compared to prior, similar appearance. No STEMI Confirmed by Antony Blackbird [phone]) on 08/30/2021 1:24:26 PM ? ?Radiology ?DG Ribs Unilateral W/Chest Left ? ?Result Date: 08/30/2021 ?CLINICAL DATA:  Pt c/o hard pop sound to left rib area after putting grandson into crib on Wednesday. EXAM: LEFT RIBS AND CHEST - 3+ VIEW COMPARISON:  03/11/2017. FINDINGS: Subtle nondisplaced fracture of the anterior left seventh rib. No other evidence of a fracture.  No bone lesions. Cardiac silhouette top-normal in size. Normal mediastinal and hilar contours. Clear lungs.  No pleural effusion or pneumothorax. IMPRESSION: 1. Subtle nondisplaced fracture of the anterior left seventh rib. No other fractures. 2. No acute cardiopulmonary disease. Electronically Signed   By: Lajean Manes M.D.   On: 08/30/2021 13:20   ? ?Procedures ?Procedures  ? ? ?Medications Ordered in ED ?Medications - No data to display ? ?ED Course/ Medical Decision Making/ A&P ?  ?                        ?Medical Decision Making ?Amount and/or Complexity of Data Reviewed ?Radiology: ordered. ? ?Risk ?OTC drugs. ?Prescription drug management. ? ? ?Alert 71 year old female in no acute distress, nontoxic  appearing.  Presents to the emergency department with a chief complaint of left chest wall pain. ? ?Information obtained from patient.  Past medical records were reviewed including previous provider notes, labs, and imaging.  Patient has medical history as outlined in HPI which complicates her care. ? ?Due to mechanism of injury and tenderness on exam suspect that patient's pain is musculoskeletal in nature.  Will obtain x-ray imaging to evaluate for acute osseous abnormality.  Will obtain EKG due to reports of chest wall pain. ? ?I personally viewed interpret patient's EKG.  Tracing shows sinus rhythm with  no signs of STEMI. ? ?I personally viewed and interpreted patient's x-ray imaging.  Agree with radiology interpretation of nondisplaced fracture of the anterior left seventh rib.  No active cardiopulmonary disease. ? ?2 reports of rib fracture will discharge patient with prescription for Percocet and lidocaine patch.  Patient given incentive spirometer and instructed on proper use.  Patient to follow-up with PCP for repeat evaluation. ? ?Based on patient's chief complaint, I considered admission might be necessary, however after reassuring ED workup feel patient is reasonable for discharge.  Discussed results, findings, treatment and follow up. Patient advised of return precautions. Patient verbalized understanding and agreed with plan. ? ?Portions of this note were generated with Lobbyist. Dictation errors may occur despite best attempts at proofreading. ? ? ? ? ? ? ? ? ?Final Clinical Impression(s) / ED Diagnoses ?Final diagnoses:  ?Closed fracture of one rib of left side, initial encounter  ? ? ?Rx / DC Orders ?ED Discharge Orders   ? ?      Ordered  ?  Lidocaine (HM LIDOCAINE PATCH) 4 % PTCH  Every 12 hours PRN       ? 08/30/21 1344  ?  oxyCODONE-acetaminophen (PERCOCET/ROXICET) 5-325 MG tablet  Every 8 hours PRN       ? 08/30/21 1344  ? ?  ?  ? ?  ? ? ?  ?Loni Beckwith, PA-C ?08/31/21  0101 ? ?  ?Tegeler, Gwenyth Allegra, MD ?08/31/21 1503 ? ?

## 2021-09-01 DIAGNOSIS — Z85820 Personal history of malignant melanoma of skin: Secondary | ICD-10-CM | POA: Diagnosis not present

## 2021-09-01 DIAGNOSIS — D1801 Hemangioma of skin and subcutaneous tissue: Secondary | ICD-10-CM | POA: Diagnosis not present

## 2021-09-01 DIAGNOSIS — L821 Other seborrheic keratosis: Secondary | ICD-10-CM | POA: Diagnosis not present

## 2021-09-01 DIAGNOSIS — L570 Actinic keratosis: Secondary | ICD-10-CM | POA: Diagnosis not present

## 2021-09-01 DIAGNOSIS — Z85828 Personal history of other malignant neoplasm of skin: Secondary | ICD-10-CM | POA: Diagnosis not present

## 2021-09-01 DIAGNOSIS — D225 Melanocytic nevi of trunk: Secondary | ICD-10-CM | POA: Diagnosis not present

## 2021-09-01 DIAGNOSIS — L814 Other melanin hyperpigmentation: Secondary | ICD-10-CM | POA: Diagnosis not present

## 2021-09-01 DIAGNOSIS — D235 Other benign neoplasm of skin of trunk: Secondary | ICD-10-CM | POA: Diagnosis not present

## 2021-09-02 ENCOUNTER — Other Ambulatory Visit: Payer: Self-pay | Admitting: Family Medicine

## 2021-09-02 DIAGNOSIS — E2839 Other primary ovarian failure: Secondary | ICD-10-CM | POA: Diagnosis not present

## 2021-09-02 DIAGNOSIS — S2239XA Fracture of one rib, unspecified side, initial encounter for closed fracture: Secondary | ICD-10-CM | POA: Diagnosis not present

## 2021-09-23 ENCOUNTER — Encounter: Payer: Self-pay | Admitting: Obstetrics and Gynecology

## 2021-09-23 ENCOUNTER — Ambulatory Visit (INDEPENDENT_AMBULATORY_CARE_PROVIDER_SITE_OTHER): Payer: PPO | Admitting: Obstetrics and Gynecology

## 2021-09-23 VITALS — BP 126/80 | HR 72 | Ht 62.0 in | Wt 169.0 lb

## 2021-09-23 DIAGNOSIS — Z86018 Personal history of other benign neoplasm: Secondary | ICD-10-CM

## 2021-09-23 DIAGNOSIS — N952 Postmenopausal atrophic vaginitis: Secondary | ICD-10-CM | POA: Diagnosis not present

## 2021-09-23 DIAGNOSIS — Z5181 Encounter for therapeutic drug level monitoring: Secondary | ICD-10-CM | POA: Diagnosis not present

## 2021-09-23 DIAGNOSIS — N3281 Overactive bladder: Secondary | ICD-10-CM

## 2021-09-23 MED ORDER — PREMARIN 0.625 MG/GM VA CREA: TOPICAL_CREAM | VAGINAL | 2 refills | Status: DC

## 2021-09-23 NOTE — Progress Notes (Signed)
71 y.o. G34P3003 Married Caucasian female here for office visit.  ? ?Patient is followed for vaginal atrophy, osteopenia, and overactive bladder.  ? ?States Myrbetriq is not working well.  ?She does not want to continue this.  ? ?She has tried Toviaz, Ditropan XL, and PTNS.  ? ?Denies leakage of urine with cough, laugh, sneeze. ? ?Cracked a rib 1 week ago.  ? ?3 grandchildren. ? ?PCP:   Donnie Coffin, MD ? ?Patient's last menstrual period was 06/15/2004 (approximate).     ?  ?    ?Sexually active: No.  ?The current method of family planning is status post hysterectomy.    ?Exercising: No.  The patient does not participate in regular exercise at present. ?Smoker:  Former ? ?Health Maintenance: ?Pap:  2007 Neg ?History of abnormal Pap:  no ?MMG:  04-30-21 Neg/BiRads1 ?Colonoscopy:   08-25-15 diverticulosis;f/u 10 yrs ?BMD:   10-24-19  Result :Osteopenia.  Has appointment for BMD for 10/19/21.  ?TDaP:  10-09-13 ?Gardasil:   n/a ?HIV: 03-11-17 NR ?Hep C: 03-24-15 Neg ?Screening Labs:  PCP ? ? reports that she quit smoking about 48 years ago. Her smoking use included cigarettes. She has never used smokeless tobacco. She reports current alcohol use of about 14.0 standard drinks per week. She reports that she does not use drugs. ? ?Past Medical History:  ?Diagnosis Date  ? Bladder tumor   ? BENIGN  ? Cancer [facility])   ? Melanoma removed from left upper thigh--negative margins  ? Claustrophobia   ? Elevated liver function tests 2016  ? Gastritis determined by endoscopy 03/21/2017  ? Dr. Collene Mares  ? GERD (gastroesophageal reflux disease)   ? Hiatal hernia 05/29/2015  ? CT scan of abdomen and pelvis  ? Osteopenia 2018  ? Sigmoid diverticulitis 05/29/2015  ? CT scan of abdomen and pelvis  ? Urinary urgency   ? ? ?Past Surgical History:  ?Procedure Laterality Date  ? ABDOMINAL HYSTERECTOMY    ? ABDOMINAL SACROCOLPOPEXY N/A 11/06/2013  ? Procedure: ABDOMINO SACROCOLPOPEXY with Halban's culdoplasty;  Surgeon: Jamey Reas de Berton Lan, MD;  Location: [facility];  Service: Gynecology;  Laterality: N/A;  ? ANTERIOR AND POSTERIOR REPAIR N/A 11/06/2013  ? Procedure: ANTERIOR (CYSTOCELE) AND POSTERIOR REPAIR (RECTOCELE);  Surgeon: Jamey Reas de Berton Lan, MD;  Location: [facility];  Service: Gynecology;  Laterality: N/A;  ? ANTERIOR CRUCIATE LIGAMENT REPAIR Right   ? BLADDER SUSPENSION N/A 11/06/2013  ? Procedure: TRANSVAGINAL TAPE (TVT) PROCEDURE exact midurethral sling;  Surgeon: Everardo All Amundson de Berton Lan, MD;  Location: [facility];  Service: Gynecology;  Laterality: N/A;  ? BLADDER TUMOR EXCISION  1992  ? benign  ? CYSTOSCOPY N/A 11/06/2013  ? Procedure: CYSTOSCOPY;  Surgeon: Jamey Reas de Berton Lan, MD;  Location: [facility];  Service: Gynecology;  Laterality: N/A;  ? HIATAL HERNIA REPAIR N/A 05/27/2014  ? Procedure: LAPAROSCOPIC REPAIR OF HIATAL HERNIA WITH MESH;  Surgeon: Pedro Earls, MD;  Location: [facility];  Service: General;  Laterality: N/A;  ? INSERTION OF MESH  05/27/2014  ? Procedure: INSERTION OF MESH;  Surgeon: Pedro Earls, MD;  Location: [facility];  Service: General;;  two different mesh used for two different sites  ? SPIGELIAN HERNIA Right 05/27/2014  ? Procedure: RIGHT SPIGELIAN HERNIA REPAIR WITH MESH;  Surgeon: Pedro Earls, MD;  Location: [facility];  Service: General;  Laterality: Right;  ? TOTAL ABDOMINAL HYSTERECTOMY W/ BILATERAL SALPINGOOPHORECTOMY  06/15/04  ?  dermoid and AUB  ? ? ?Current Outpatient Medications  ?Medication Sig Dispense Refill  ? acetaminophen (TYLENOL) 500 MG tablet Take 1,000 mg by mouth every 6 (six) hours as needed for fever (fever).    ? Calcium Carbonate (CALTRATE 600 PO) Take by mouth.    ? cholecalciferol (VITAMIN D) 1000 UNITS tablet Take 2,000 Units by mouth daily.     ? conjugated estrogens (PREMARIN) vaginal cream Place 1/2 gram in the vagina twice weekly. 30 g 1  ? Multiple Vitamins-Minerals (CENTRUM SILVER PO) Take by mouth.    ? omeprazole (PRILOSEC) 40 MG capsule 1  capsule    ? Probiotic Product ([place]) 170 MG CAPS Take 1 capsule by mouth daily as needed (stomach issues).     ? triamcinolone (KENALOG) 0.025 % ointment Apply 1 application topically 2 (two) times daily. 30 g 1  ? ?No current facility-administered medications for this visit.  ? ? ?Family History  ?Problem Relation Age of Onset  ? Sudden Cardiac Death Father 41  ?     dued in his sleep, unkown cause  ? Cancer Maternal Grandfather   ? ? ?Review of Systems  ?All other systems reviewed and are negative. ? ?Exam:   ?BP 126/80   Pulse 72   Ht '5\' 2"'$  (1.575 m)   Wt 169 lb (76.7 kg)   LMP 06/15/2004 (Approximate)   SpO2 98%   BMI 30.91 kg/m?     ?General appearance: alert, cooperative and appears stated age ?Head: normocephalic, without obvious abnormality, atraumatic ?Neck: no adenopathy, supple, symmetrical, trachea midline and thyroid normal to inspection and palpation ?Lungs: clear to auscultation bilaterally ?Breasts: normal appearance, no masses or tenderness, No nipple retraction or dimpling on right, has nipple retraction on left, No nipple discharge or bleeding, No axillary adenopathy ?Heart: regular rate and rhythm ?Abdomen: soft, non-tender; no masses, no organomegaly ?Extremities: extremities normal, atraumatic, no cyanosis or edema ?Skin: skin color, texture, turgor normal. No rashes or lesions ?Lymph nodes: cervical, supraclavicular, and axillary nodes normal. ?Neurologic: grossly normal ? ?Pelvic: External genitalia:  no lesions ?             No abnormal inguinal nodes palpated. ?             Urethra:  normal appearing urethra with no masses, tenderness or lesions ?             Bartholins and Skenes: normal    ?             Vagina: normal appearing vagina with normal color and discharge, no lesions.  Good anterior and apical vaginal support.  No mesh erosion or exposure. ?             Cervix: absent ?             Pap taken: no ?Bimanual Exam:  Uterus:  absent ?             Adnexa:  no mass, fullness, tenderness ?             Rectal exam: yes.  Confirms. ?             Anus:  normal sphincter tone, no lesions ? ?Chaperone was present for exam:  Estill Bamberg, CMA ? ?Assessment:   ?Well woman visit with gynecologic exam. ?Status post TAH/BSO.  ?Status post abdominosacrocolpopexy.  ?Status post hiatal hernia repair and abdominal wall hernia repair.  ?Pelvic exam with abnormal findings absent.  ?Hx vaginal atrophy treated  with local vaginal estrogen.  ?Overactive bladder. Nor responding to medication. ?Hx bladder tumor.  ?Left nipple inversion (old change).  ?Osteopenia.   ?Medication monitoring visit.  ? ?Plan: ?Mammogram screening discussed. ?Self breast awareness reviewed. ?Pap and HR HPV as above. ?Guidelines for Calcium, Vitamin D, regular exercise program including cardiovascular and weight bearing exercise. ?Refill of Premarin vaginal cream.  1/2 gram pv at hs two to three times weekly.  I discussed potential effect on breast cancer.  ?Referral to Dr. Matilde Sprang for further evaluation and treatment of overactive bladder. ?BMD this summer.  ?Follow up annually and prn.  ? ?After visit summary provided.  ? ?26 min  total time was spent for this patient encounter, including preparation, face-to-face counseling with the patient, coordination of care, and documentation of the encounter. ? ? ? ? ?

## 2021-09-25 DIAGNOSIS — K219 Gastro-esophageal reflux disease without esophagitis: Secondary | ICD-10-CM | POA: Diagnosis not present

## 2021-09-25 DIAGNOSIS — E7800 Pure hypercholesterolemia, unspecified: Secondary | ICD-10-CM | POA: Diagnosis not present

## 2021-09-25 DIAGNOSIS — Z23 Encounter for immunization: Secondary | ICD-10-CM | POA: Diagnosis not present

## 2021-09-25 DIAGNOSIS — Z0000 Encounter for general adult medical examination without abnormal findings: Secondary | ICD-10-CM | POA: Diagnosis not present

## 2021-10-05 ENCOUNTER — Telehealth: Payer: Self-pay

## 2021-10-05 NOTE — Telephone Encounter (Signed)
Spoke with patient and let her know that referral has been faxed to [facility] for appt with Dr. McDiarmid.

## 2021-10-05 NOTE — Telephone Encounter (Signed)
09-23-21 office note "Referral to Dr. Matilde Sprang for further evaluation and treatment of overactive bladder."

## 2021-10-05 NOTE — Telephone Encounter (Signed)
I provided patient with phone number to [facility] in case she would like to call.

## 2021-10-16 NOTE — Telephone Encounter (Signed)
Per [facility] message left for patient to call.

## 2021-11-04 DIAGNOSIS — N312 Flaccid neuropathic bladder, not elsewhere classified: Secondary | ICD-10-CM | POA: Diagnosis not present

## 2021-11-04 DIAGNOSIS — R351 Nocturia: Secondary | ICD-10-CM | POA: Diagnosis not present

## 2021-11-04 DIAGNOSIS — R350 Frequency of micturition: Secondary | ICD-10-CM | POA: Diagnosis not present

## 2021-11-04 DIAGNOSIS — N3941 Urge incontinence: Secondary | ICD-10-CM | POA: Diagnosis not present

## 2021-11-18 ENCOUNTER — Ambulatory Visit
Admission: RE | Admit: 2021-11-18 | Discharge: 2021-11-18 | Disposition: A | Discharge status: Home / Self Care | Payer: BLUE CROSS/BLUE SHIELD | Source: Ambulatory Visit | Attending: Family Medicine | Admitting: Family Medicine

## 2021-11-18 DIAGNOSIS — E2839 Other primary ovarian failure: Secondary | ICD-10-CM

## 2021-11-18 DIAGNOSIS — M85851 Other specified disorders of bone density and structure, right thigh: Secondary | ICD-10-CM | POA: Diagnosis not present

## 2021-11-18 DIAGNOSIS — Z780 Asymptomatic menopausal state: Secondary | ICD-10-CM | POA: Diagnosis not present

## 2021-11-30 DIAGNOSIS — M6289 Other specified disorders of muscle: Secondary | ICD-10-CM | POA: Diagnosis not present

## 2021-11-30 DIAGNOSIS — R350 Frequency of micturition: Secondary | ICD-10-CM | POA: Diagnosis not present

## 2021-11-30 DIAGNOSIS — R351 Nocturia: Secondary | ICD-10-CM | POA: Diagnosis not present

## 2021-11-30 DIAGNOSIS — M6281 Muscle weakness (generalized): Secondary | ICD-10-CM | POA: Diagnosis not present

## 2021-11-30 DIAGNOSIS — M62838 Other muscle spasm: Secondary | ICD-10-CM | POA: Diagnosis not present

## 2021-11-30 DIAGNOSIS — N3941 Urge incontinence: Secondary | ICD-10-CM | POA: Diagnosis not present

## 2021-12-04 DIAGNOSIS — N3941 Urge incontinence: Secondary | ICD-10-CM | POA: Diagnosis not present

## 2021-12-04 DIAGNOSIS — R350 Frequency of micturition: Secondary | ICD-10-CM | POA: Diagnosis not present

## 2021-12-16 DIAGNOSIS — R351 Nocturia: Secondary | ICD-10-CM | POA: Diagnosis not present

## 2021-12-16 DIAGNOSIS — R350 Frequency of micturition: Secondary | ICD-10-CM | POA: Diagnosis not present

## 2021-12-16 DIAGNOSIS — M6289 Other specified disorders of muscle: Secondary | ICD-10-CM | POA: Diagnosis not present

## 2021-12-16 DIAGNOSIS — M62838 Other muscle spasm: Secondary | ICD-10-CM | POA: Diagnosis not present

## 2021-12-16 DIAGNOSIS — M6281 Muscle weakness (generalized): Secondary | ICD-10-CM | POA: Diagnosis not present

## 2021-12-16 DIAGNOSIS — N3941 Urge incontinence: Secondary | ICD-10-CM | POA: Diagnosis not present

## 2022-01-01 DIAGNOSIS — M6281 Muscle weakness (generalized): Secondary | ICD-10-CM | POA: Diagnosis not present

## 2022-01-01 DIAGNOSIS — M6289 Other specified disorders of muscle: Secondary | ICD-10-CM | POA: Diagnosis not present

## 2022-01-01 DIAGNOSIS — N3941 Urge incontinence: Secondary | ICD-10-CM | POA: Diagnosis not present

## 2022-01-01 DIAGNOSIS — R350 Frequency of micturition: Secondary | ICD-10-CM | POA: Diagnosis not present

## 2022-01-01 DIAGNOSIS — M62838 Other muscle spasm: Secondary | ICD-10-CM | POA: Diagnosis not present

## 2022-01-01 DIAGNOSIS — R351 Nocturia: Secondary | ICD-10-CM | POA: Diagnosis not present

## 2022-01-29 DIAGNOSIS — N3941 Urge incontinence: Secondary | ICD-10-CM | POA: Diagnosis not present

## 2022-01-29 DIAGNOSIS — M6281 Muscle weakness (generalized): Secondary | ICD-10-CM | POA: Diagnosis not present

## 2022-01-29 DIAGNOSIS — M62838 Other muscle spasm: Secondary | ICD-10-CM | POA: Diagnosis not present

## 2022-01-29 DIAGNOSIS — M6289 Other specified disorders of muscle: Secondary | ICD-10-CM | POA: Diagnosis not present

## 2022-02-01 DIAGNOSIS — D1801 Hemangioma of skin and subcutaneous tissue: Secondary | ICD-10-CM | POA: Diagnosis not present

## 2022-02-01 DIAGNOSIS — D225 Melanocytic nevi of trunk: Secondary | ICD-10-CM | POA: Diagnosis not present

## 2022-02-01 DIAGNOSIS — L738 Other specified follicular disorders: Secondary | ICD-10-CM | POA: Diagnosis not present

## 2022-02-01 DIAGNOSIS — Z85828 Personal history of other malignant neoplasm of skin: Secondary | ICD-10-CM | POA: Diagnosis not present

## 2022-02-01 DIAGNOSIS — L821 Other seborrheic keratosis: Secondary | ICD-10-CM | POA: Diagnosis not present

## 2022-02-01 DIAGNOSIS — L570 Actinic keratosis: Secondary | ICD-10-CM | POA: Diagnosis not present

## 2022-02-01 DIAGNOSIS — Z85820 Personal history of malignant melanoma of skin: Secondary | ICD-10-CM | POA: Diagnosis not present

## 2022-02-16 NOTE — Telephone Encounter (Signed)
Patient had visit with Dr. Matilde Sprang 12/04/21.

## 2022-03-03 DIAGNOSIS — N3941 Urge incontinence: Secondary | ICD-10-CM | POA: Diagnosis not present

## 2022-03-20 DIAGNOSIS — Z23 Encounter for immunization: Secondary | ICD-10-CM | POA: Diagnosis not present

## 2022-03-30 ENCOUNTER — Other Ambulatory Visit: Payer: Self-pay | Admitting: Obstetrics and Gynecology

## 2022-03-30 DIAGNOSIS — Z1231 Encounter for screening mammogram for malignant neoplasm of breast: Secondary | ICD-10-CM

## 2022-04-05 DIAGNOSIS — E7800 Pure hypercholesterolemia, unspecified: Secondary | ICD-10-CM | POA: Diagnosis not present

## 2022-05-28 ENCOUNTER — Ambulatory Visit
Admission: RE | Admit: 2022-05-28 | Discharge: 2022-05-28 | Disposition: A | Discharge status: Home / Self Care | Payer: PPO | Source: Ambulatory Visit | Attending: Obstetrics and Gynecology | Admitting: Obstetrics and Gynecology

## 2022-05-28 DIAGNOSIS — Z1231 Encounter for screening mammogram for malignant neoplasm of breast: Secondary | ICD-10-CM | POA: Diagnosis not present

## 2022-07-19 DIAGNOSIS — D1801 Hemangioma of skin and subcutaneous tissue: Secondary | ICD-10-CM | POA: Diagnosis not present

## 2022-07-19 DIAGNOSIS — Z85828 Personal history of other malignant neoplasm of skin: Secondary | ICD-10-CM | POA: Diagnosis not present

## 2022-07-19 DIAGNOSIS — D225 Melanocytic nevi of trunk: Secondary | ICD-10-CM | POA: Diagnosis not present

## 2022-07-19 DIAGNOSIS — L821 Other seborrheic keratosis: Secondary | ICD-10-CM | POA: Diagnosis not present

## 2022-07-19 DIAGNOSIS — L570 Actinic keratosis: Secondary | ICD-10-CM | POA: Diagnosis not present

## 2022-07-19 DIAGNOSIS — Z85820 Personal history of malignant melanoma of skin: Secondary | ICD-10-CM | POA: Diagnosis not present

## 2022-07-29 DIAGNOSIS — R350 Frequency of micturition: Secondary | ICD-10-CM | POA: Diagnosis not present

## 2022-07-29 DIAGNOSIS — N3941 Urge incontinence: Secondary | ICD-10-CM | POA: Diagnosis not present

## 2022-09-15 NOTE — Progress Notes (Signed)
72 y.o. G41P3003 Married Caucasian female here for an office visit.   Taking Gemtesa for OAB through Dr. Sherron Monday.  Did pelvic floor therapy.  Notices her urinary incontinence is worse at home.  Up once a night.   Using Premarin cream, twice a week to treat atrophy.  She had a sacrocolpopexy and midurethral sling years ago.  Helping to care for grandson, age 70.  Doing a family beach trip.   PCP:   Dr. Clovis Riley  Patient's last menstrual period was 06/15/2004 (approximate).           Sexually active: Yes  The current method of family planning is status post hysterectomy.    Exercising: Yes.     Walking and tennis Smoker:  former  Health Maintenance: Pap:  06/2005 neg History of abnormal Pap:  no MMG:  05/28/22 Breast Density CAT B, BI-RADS CAT 1 neg Colonoscopy:  08/24/05 BMD:   11/18/21  Result  osteopenic TDaP:  10/09/13 Gardasil:   no HIV: 03/11/17 reactive Hep C: 03/24/15 neg Screening Labs:  PCP   reports that she quit smoking about 49 years ago. Her smoking use included cigarettes. She has never used smokeless tobacco. She reports current alcohol use of about 14.0 standard drinks of alcohol per week. She reports that she does not use drugs.  Past Medical History:  Diagnosis Date   Bladder tumor    BENIGN   Cancer (HCC)    Melanoma removed from left upper thigh--negative margins   Claustrophobia    Elevated liver function tests 2016   Gastritis determined by endoscopy 03/21/2017   Dr. Loreta Ave   GERD (gastroesophageal reflux disease)    Hiatal hernia 05/29/2015   CT scan of abdomen and pelvis   Osteopenia 2018   Sigmoid diverticulitis 05/29/2015   CT scan of abdomen and pelvis   Urinary urgency     Past Surgical History:  Procedure Laterality Date   ABDOMINAL HYSTERECTOMY     ABDOMINAL SACROCOLPOPEXY N/A 11/06/2013   Procedure: ABDOMINO SACROCOLPOPEXY with Halban's culdoplasty;  Surgeon: Jacqualin Combes de Gwenevere Ghazi, MD;  Location: [facility];  Service:  Gynecology;  Laterality: N/A;   ANTERIOR AND POSTERIOR REPAIR N/A 11/06/2013   Procedure: ANTERIOR (CYSTOCELE) AND POSTERIOR REPAIR (RECTOCELE);  Surgeon: Jacqualin Combes de Gwenevere Ghazi, MD;  Location: [facility];  Service: Gynecology;  Laterality: N/A;   ANTERIOR CRUCIATE LIGAMENT REPAIR Right    BLADDER SUSPENSION N/A 11/06/2013   Procedure: TRANSVAGINAL TAPE (TVT) PROCEDURE exact midurethral sling;  Surgeon: Jacqualin Combes de Gwenevere Ghazi, MD;  Location: [facility];  Service: Gynecology;  Laterality: N/A;   BLADDER TUMOR EXCISION  1992   benign   CYSTOSCOPY N/A 11/06/2013   Procedure: CYSTOSCOPY;  Surgeon: Jacqualin Combes de Gwenevere Ghazi, MD;  Location: [facility];  Service: Gynecology;  Laterality: N/A;   HIATAL HERNIA REPAIR N/A 05/27/2014   Procedure: LAPAROSCOPIC REPAIR OF HIATAL HERNIA WITH MESH;  Surgeon: Valarie Merino, MD;  Location: [facility];  Service: General;  Laterality: N/A;   INSERTION OF MESH  05/27/2014   Procedure: INSERTION OF MESH;  Surgeon: Valarie Merino, MD;  Location: [facility];  Service: General;;  two different mesh used for two different sites   SPIGELIAN HERNIA Right 05/27/2014   Procedure: RIGHT SPIGELIAN HERNIA REPAIR WITH MESH;  Surgeon: Valarie Merino, MD;  Location: [facility];  Service: General;  Laterality: Right;   TOTAL ABDOMINAL HYSTERECTOMY W/ BILATERAL SALPINGOOPHORECTOMY  06/15/04  dermoid and AUB    Current Outpatient Medications  Medication Sig Dispense Refill   acetaminophen (TYLENOL) 500 MG tablet Take 1,000 mg by mouth every 6 (six) hours as needed for fever (fever).     atorvastatin (LIPITOR) 20 MG tablet Take 20 mg by mouth daily.     Calcium Carbonate (CALTRATE 600 PO) Take by mouth.     cholecalciferol (VITAMIN D) 1000 UNITS tablet Take 2,000 Units by mouth daily.      Cobalamin Combinations (NEURIVA PLUS PO) Take by mouth.     conjugated estrogens (PREMARIN) vaginal cream Place 1/2 gram in the vagina two to three times weekly. 30 g 2    GEMTESA 75 MG TABS Take 1 tablet by mouth daily.     Multiple Vitamins-Minerals (CENTRUM SILVER PO) Take by mouth.     omeprazole (PRILOSEC) 40 MG capsule 1 capsule     Probiotic Product (ULTRAFLORA IMMUNE HEALTH) 170 MG CAPS Take 1 capsule by mouth daily as needed (stomach issues).      No current facility-administered medications for this visit.    Family History  Problem Relation Age of Onset   Sudden Cardiac Death Father 75       dued in his sleep, unkown cause   Cancer Maternal Grandfather     Review of Systems  All other systems reviewed and are negative.   Exam:   BP 130/86 (BP Location: Right Arm, Patient Position: Sitting, Cuff Size: Large)   Pulse 71   Ht 5' 2.25" (1.581 m)   Wt 167 lb (75.8 kg)   LMP 06/15/2004 (Approximate)   SpO2 98%   BMI 30.30 kg/m     General appearance: alert, cooperative and appears stated age Head: normocephalic, without obvious abnormality, atraumatic Neck: no adenopathy, supple, symmetrical, trachea midline and thyroid normal to inspection and palpation Lungs: clear to auscultation bilaterally Breasts: normal appearance, no masses or tenderness, bilateral nipple inversion, No nipple discharge or bleeding, No axillary adenopathy Heart: regular rate and rhythm Abdomen: soft, non-tender; no masses, no organomegaly Extremities: extremities normal, atraumatic, no cyanosis or edema Skin: skin color, texture, turgor normal. No rashes or lesions Lymph nodes: cervical, supraclavicular, and axillary nodes normal. Neurologic: grossly normal  Pelvic: External genitalia:  no lesions              No abnormal inguinal nodes palpated.              Urethra:  normal appearing urethra with no masses, tenderness or lesions              Bartholins and Skenes: normal                 Vagina: normal appearing vagina with normal color and discharge, no lesions              Cervix: absent              Pap taken: no Bimanual Exam:  Uterus:  absent               Adnexa: no mass, fullness, tenderness              Rectal exam: yes.  Confirms.              Anus:  normal sphincter tone, no lesions  Chaperone was present for exam:  Warren Lacy, CMA  Assessment:    Status post TAH/BSO.  Status post abdominosacrocolpopexy.  Status post hiatal hernia repair and abdominal wall hernia repair.  Off ERT.  Hx vaginal atrophy treated with local vaginal estrogen.  Medication monitoring visit.  Overactive bladder. On Gemtesa. Elevated blood pressure.  Bilateral nipple inversion.  Osteopenia.    Plan: Mammogram screening discussed. Self breast awareness reviewed. Refill fo Premarin vaginal cream.  I discussed potential effect on breast cancer.  Guidelines for Calcium, Vitamin D, regular exercise program including cardiovascular and weight bearing exercise. BMD next year.  Follow up annually and prn.   25 min  total time was spent for this patient encounter, including preparation, face-to-face counseling with the patient, coordination of care, and documentation of the encounter.

## 2022-09-29 ENCOUNTER — Ambulatory Visit (INDEPENDENT_AMBULATORY_CARE_PROVIDER_SITE_OTHER): Payer: Medicare HMO | Admitting: Obstetrics and Gynecology

## 2022-09-29 ENCOUNTER — Encounter: Payer: Self-pay | Admitting: Obstetrics and Gynecology

## 2022-09-29 VITALS — BP 130/86 | HR 71 | Ht 62.25 in | Wt 167.0 lb

## 2022-09-29 DIAGNOSIS — M8588 Other specified disorders of bone density and structure, other site: Secondary | ICD-10-CM | POA: Diagnosis not present

## 2022-09-29 DIAGNOSIS — R32 Unspecified urinary incontinence: Secondary | ICD-10-CM | POA: Diagnosis not present

## 2022-09-29 DIAGNOSIS — N952 Postmenopausal atrophic vaginitis: Secondary | ICD-10-CM

## 2022-09-29 DIAGNOSIS — E7800 Pure hypercholesterolemia, unspecified: Secondary | ICD-10-CM | POA: Diagnosis not present

## 2022-09-29 DIAGNOSIS — K219 Gastro-esophageal reflux disease without esophagitis: Secondary | ICD-10-CM | POA: Diagnosis not present

## 2022-09-29 DIAGNOSIS — E2839 Other primary ovarian failure: Secondary | ICD-10-CM | POA: Diagnosis not present

## 2022-09-29 DIAGNOSIS — Z0000 Encounter for general adult medical examination without abnormal findings: Secondary | ICD-10-CM | POA: Diagnosis not present

## 2022-09-29 DIAGNOSIS — Z5181 Encounter for therapeutic drug level monitoring: Secondary | ICD-10-CM | POA: Diagnosis not present

## 2022-09-29 MED ORDER — PREMARIN 0.625 MG/GM VA CREA: TOPICAL_CREAM | VAGINAL | 2 refills | Status: DC

## 2022-09-30 DIAGNOSIS — N3941 Urge incontinence: Secondary | ICD-10-CM | POA: Diagnosis not present

## 2022-09-30 DIAGNOSIS — R351 Nocturia: Secondary | ICD-10-CM | POA: Diagnosis not present

## 2022-09-30 DIAGNOSIS — M6289 Other specified disorders of muscle: Secondary | ICD-10-CM | POA: Diagnosis not present

## 2022-11-02 ENCOUNTER — Encounter (HOSPITAL_COMMUNITY): Payer: Self-pay | Admitting: Emergency Medicine

## 2022-11-02 ENCOUNTER — Other Ambulatory Visit: Payer: Self-pay

## 2022-11-02 ENCOUNTER — Emergency Department (HOSPITAL_COMMUNITY)
Admission: EM | Admit: 2022-11-02 | Discharge: 2022-11-02 | Disposition: A | Discharge status: Home / Self Care | Payer: Medicare HMO | Attending: Emergency Medicine | Admitting: Emergency Medicine

## 2022-11-02 DIAGNOSIS — R040 Epistaxis: Secondary | ICD-10-CM | POA: Diagnosis not present

## 2022-11-02 DIAGNOSIS — Z85820 Personal history of malignant melanoma of skin: Secondary | ICD-10-CM | POA: Diagnosis not present

## 2022-11-02 DIAGNOSIS — I10 Essential (primary) hypertension: Secondary | ICD-10-CM | POA: Diagnosis not present

## 2022-11-02 LAB — CBC
HCT: 40.6 % (ref 36.0–46.0)
Hemoglobin: 13.3 g/dL (ref 12.0–15.0)
MCH: 32 pg (ref 26.0–34.0)
MCHC: 32.8 g/dL (ref 30.0–36.0)
MCV: 97.6 fL (ref 80.0–100.0)
Platelets: 325 10*3/uL (ref 150–400)
RBC: 4.16 MIL/uL (ref 3.87–5.11)
RDW: 12.3 % (ref 11.5–15.5)
WBC: 7.6 10*3/uL (ref 4.0–10.5)
nRBC: 0 % (ref 0.0–0.2)

## 2022-11-02 MED ORDER — ONDANSETRON HCL 4 MG/2ML IJ SOLN
4.0000 mg | Freq: Once | INTRAMUSCULAR | Status: AC
  Administered 2022-11-02: 4 mg via INTRAVENOUS
  Filled 2022-11-02: qty 2

## 2022-11-02 MED ORDER — OXYMETAZOLINE HCL 0.05 % NA SOLN
1.0000 | Freq: Once | NASAL | Status: AC
  Administered 2022-11-02: 1 via NASAL
  Filled 2022-11-02: qty 30

## 2022-11-02 MED ORDER — LIDOCAINE HCL 4 % EX SOLN
Freq: Once | CUTANEOUS | Status: DC
Start: 2022-11-02 — End: 2022-11-02
  Filled 2022-11-02: qty 50

## 2022-11-02 MED ORDER — SILVER NITRATE-POT NITRATE 75-25 % EX MISC
1.0000 | Freq: Once | CUTANEOUS | Status: AC
  Administered 2022-11-02: 1 via TOPICAL
  Filled 2022-11-02: qty 1

## 2022-11-02 NOTE — Discharge Instructions (Signed)
Thank you for coming to [facility] Emergency Department. You were seen for nose bleeding. We did an exam, treated you with Afrin nasal spray and direct pressure, and your bleeding stopped. Your Hgb was 13.3 and you were observed for an hour without recurrence of your bleeding.   Please follow up with your primary care provider within 1 week as needed.   In order to prevent future nosebleeds, you can use a humidifier overnight at home.  You can also use nasal saline spray 1-3 times per day to help keep your nasal mucosa moist.  Please do not put anything in your nose, including your fingers, nasal tissue. Please do not take NSAIDs such as aspirin or ibuprofen or advil for 3-4 days.   Do not hesitate to return to the ED or call 911 if you experience: -Worsening symptoms -Lightheadedness, passing out -Fevers/chills -Anything else that concerns you

## 2022-11-02 NOTE — ED Triage Notes (Signed)
Pt BIB [facility] from home. Patient reported she got up at 0630 to go to the bathroom she reported rubbing inside of the right side of her nose it started bleeding and then five minutes after that the left side started to bleed. EMS was called they reported her initial blood pressure was 160/100 she was given Afrin nose spray twice then her Blood pressure went to 190/110. Her last blood pressure taken before arrive to the emergency room is 144/86. Pulse 80, Saturation 97 to 98% RA, RR 14 to 16 and blood sugar 108. Patient is A & O x 4

## 2022-11-02 NOTE — ED Provider Notes (Signed)
[name]   CSN: [id number] Arrival date & time: 11/02/22  4098     History  Chief Complaint  Patient presents with   Epistaxis   Hypertension    [name] is a 72 y.o. female with HLD, GERD, estrogen deficiency, osteopenia who presents with epistaxis.   Patient with R-sided epistaxis beginning at 0630 AM. No h/o epistaxis. Was rubbing the inside of her nose when it started. No inciting trauma. Does not take blood thinners. EMS gave Afrin nose spray twice with continued bleeding. Patient states she feels like she is swallowing blood.    Epistaxis Hypertension       Home Medications Prior to Admission medications   Medication Sig Start Date End Date Taking? Authorizing Provider  acetaminophen (TYLENOL) 500 MG tablet Take 1,000 mg by mouth every 6 (six) hours as needed for fever (fever).    [provider]  atorvastatin (LIPITOR) 20 MG tablet Take 20 mg by mouth daily. 07/09/22   [provider]  Calcium Carbonate (CALTRATE 600 PO) Take 1 tablet by mouth.    [provider]  cholecalciferol (VITAMIN D) 1000 UNITS tablet Take 2,000 Units by mouth daily.     [provider]  Cobalamin Combinations (NEURIVA PLUS PO) Take 1 tablet by mouth daily.    [provider]  conjugated estrogens (PREMARIN) vaginal cream Place 1/2 gram in the vagina two to three times weekly. 09/29/22   Amundson Shirley Friar, MD  GEMTESA 75 MG TABS Take 1 tablet by mouth daily. 06/25/22   [provider]  Multiple Vitamins-Minerals (CENTRUM SILVER PO) Take by mouth.    [provider]  omeprazole (PRILOSEC) 40 MG capsule Take 40 mg by mouth daily.    [provider]  Probiotic Product (ULTRAFLORA IMMUNE HEALTH) 170 MG CAPS Take 1 capsule by mouth daily as needed (stomach issues).     [provider]  saccharomyces boulardii (FLORASTOR) 250 MG capsule Take  250 mg by mouth daily.    [provider]      Allergies    Sulfa antibiotics    Review of Systems   Review of Systems  HENT:  Positive for nosebleeds.    A 10 point review of systems was performed and is negative unless otherwise reported in HPI.  Physical Exam Updated Vital Signs BP 137/71 (BP Location: Right Arm)   Pulse 84   Temp 98 F (36.7 C) (Oral)   Resp 17   Ht 5' 2.5" (1.588 m)   Wt 75.8 kg   LMP 06/15/2004 (Approximate)   SpO2 99%   BMI 30.06 kg/m  Physical Exam General: Normal appearing female, sitting in bed.  HEENT: PERRLA, EOMI, sclera anicteric, MMM, trachea midline.  Mild volume right-sided epistaxis from area on kiesselbach's plexus on R nasal septum Cardiology: RRR Resp: Normal respiratory rate and effort. CTAB, no wheezes, rhonchi, crackles.  Abd: Soft, non-tender, non-distended. No rebound tenderness or guarding.  GU: Deferred. MSK: No peripheral edema or signs of trauma.  Skin: warm, dry.  Neuro: A&Ox4, CNs II-XII grossly intact. MAEs. Sensation grossly intact.  Psych: Normal mood and affect.   ED Results / Procedures / Treatments   Labs (all labs ordered are listed, but only abnormal results are displayed) Labs Reviewed  CBC    EKG None  Radiology No results found.  Procedures Procedures    Medications Ordered in ED Medications  lidocaine (XYLOCAINE) 4 % external  solution (has no administration in time range)  oxymetazoline (AFRIN) 0.05 % nasal spray 1 spray (1 spray Each Nare Given 11/02/22 0849)  silver nitrate applicators applicator 1 Stick (1 Stick Topical Given 11/02/22 0849)  ondansetron (ZOFRAN) injection 4 mg (4 mg Intravenous Given 11/02/22 1610)    ED Course/ Medical Decision Making/ A&P                          Medical Decision Making Amount and/or Complexity of Data Reviewed Labs: ordered. Decision-making details documented in ED Course.  Risk OTC drugs. Prescription drug management.    This patient  presents to the ED for concern of epistaxis, this involves an extensive number of treatment options, and is a complaint that carries with it a high risk of complications and morbidity.  I considered the following differential and admission for this acute, potentially life threatening condition.   MDM:    Patient with right-sided epistaxis.  She is leaning back a little bit in bed and I encouraged the patient to lean forward and raised the back of the bed to help.  Initially on presentation I encouraged patient to put pressure while we ordered oxime Tolazine spray.  I have the patient blow out clots from her nose and observed for a moment.  She is bleeding only from the right nare.  Spray 2 sprays of Afrin into the right nare and provided 10 minutes of direct pressure myself, then had the patient apply 10 minutes of direct pressure.  If patient has recurrent bleeding I have ordered silver nitrate sticks and will observe with a nasal speculum to see if there is anything to cauterize.  Patient states there was a lot of blood at her house on scene, will get a hemoglobin to check for ABLA.  Patient had an episode of hypertension with EMS up to the 190 systolic, however here is in the 140s to 150s systolic.  Patient does not take any blood thinners, this is not a recurrent issue for her, low concern for coagulopathy.  No concern for posterior bleed.   Clinical Course as of 11/02/22 1234  Tue Nov 02, 2022  0901 Patient's bleeding from right nare stopped after blowing out clots, application of 2 sprays of Afrin, and 20 minutes of direct pressure.  Will obtain a CBC and give patient Zofran as she swallowed a lot of blood at home and has begun to feel queasy.  Will observe for 1 hour. [HN]  0941 Hemoglobin: 13.3 Decreased from 14.4 but from 5 years ago. No ABLA.  [HN]  [id number] Platelets: 325 wnl [HN]  1004 Recurrence of her bleeding [HN]  1045 Patient with recurrent bleeding. With afrin-soaked cottonball and  nasal speculum with suction, small bleeder localized on anterior medial nasal septum in area of kiesselbach's plexus. Cauterized with silver nitrate and applied direct pressure again with nasal clamp. Bleeding stopped.  [HN]  1133 Bleeding is not recurrent. Again looked w/ nasal speculum and small area on nasal septum friable. Cauterized again with very short time with silver nitrate as I believe that the cautery was inadequate before. Will observe for 30 min.  [HN]  1232 Patient observed for an additional hour with no recurrence of bleeding. Will be DC'd with specific DC instructions/return precautions. Advised to use nasal saline spray, not to put anything in the nose, avoid nose picking, use humidifier at night, and f/u with PCP as needed. Given instructions for recurrent bleed and to  come to ED if does not stop at home. Patient reports understanding. [HN]    Clinical Course User Index [HN] Loetta Rough, MD    Labs: I Ordered, and personally interpreted labs.  The pertinent results include: CBC  Additional history obtained from chart review, EMS.    Reevaluation: After the interventions noted above, I reevaluated the patient and found that they have :resolved  Social Determinants of Health: Patient lives independently   Disposition:  DC w/ discharge instructions/return precautions. All questions answered to patient's satisfaction.    Co morbidities that complicate the patient evaluation  Past Medical History:  Diagnosis Date   Bladder tumor    BENIGN   Cancer (HCC)    Melanoma removed from left upper thigh--negative margins   Claustrophobia    Elevated liver function tests 2016   Gastritis determined by endoscopy 03/21/2017   Dr. Loreta Ave   GERD (gastroesophageal reflux disease)    Hiatal hernia 05/29/2015   CT scan of abdomen and pelvis   Osteopenia 2018   Sigmoid diverticulitis 05/29/2015   CT scan of abdomen and pelvis   Urinary urgency      Medicines Meds ordered  this encounter  Medications   oxymetazoline (AFRIN) 0.05 % nasal spray 1 spray   silver nitrate applicators applicator 1 Stick   ondansetron (ZOFRAN) injection 4 mg   lidocaine (XYLOCAINE) 4 % external solution    I have reviewed the patients home medicines and have made adjustments as needed  Problem List / ED Course: Problem List Items Addressed This Visit   None Visit Diagnoses     Right-sided epistaxis    -  Primary                   This note was created using dictation software, which may contain spelling or grammatical errors.    Loetta Rough, MD 11/02/22 [phone]

## 2022-11-04 DIAGNOSIS — K219 Gastro-esophageal reflux disease without esophagitis: Secondary | ICD-10-CM | POA: Diagnosis not present

## 2022-11-04 DIAGNOSIS — K449 Diaphragmatic hernia without obstruction or gangrene: Secondary | ICD-10-CM | POA: Diagnosis not present

## 2022-11-04 DIAGNOSIS — E669 Obesity, unspecified: Secondary | ICD-10-CM | POA: Diagnosis not present

## 2022-11-04 DIAGNOSIS — K5730 Diverticulosis of large intestine without perforation or abscess without bleeding: Secondary | ICD-10-CM | POA: Diagnosis not present

## 2022-11-09 ENCOUNTER — Emergency Department (HOSPITAL_COMMUNITY)
Admission: EM | Admit: 2022-11-09 | Discharge: 2022-11-09 | Disposition: A | Discharge status: Home / Self Care | Payer: Medicare HMO | Attending: Emergency Medicine | Admitting: Emergency Medicine

## 2022-11-09 ENCOUNTER — Other Ambulatory Visit: Payer: Self-pay

## 2022-11-09 ENCOUNTER — Encounter (HOSPITAL_COMMUNITY): Payer: Self-pay

## 2022-11-09 DIAGNOSIS — R040 Epistaxis: Secondary | ICD-10-CM | POA: Insufficient documentation

## 2022-11-09 MED ORDER — AMOXICILLIN-POT CLAVULANATE 875-125 MG PO TABS
1.0000 | ORAL_TABLET | Freq: Two times a day (BID) | ORAL | 0 refills | Status: AC
Start: 2022-11-09 — End: ?

## 2022-11-09 NOTE — ED Triage Notes (Signed)
Pt presents w/ nosebleed starting this morning.  Pt was seen last week for same and had to be cauterized.  Pt reports trying pressure and Afrin w/o relief.  Nose is actively bleeding.

## 2022-11-09 NOTE — Discharge Instructions (Addendum)
Call to make an appointment to follow-up with the ENT within the next few days.  You need to have the nasal packing removed within the next few days.  If you cannot get into the ENT within that time, you should go to an urgent care or your primary care doctor to have it removed.  Return to the emergency room if you have any worsening symptoms.

## 2022-11-09 NOTE — ED Provider Notes (Signed)
[place] EMERGENCY DEPARTMENT AT [facility] Provider Note   CSN: [id number] Arrival date & time: 11/09/22  1051     History  Chief Complaint  Patient presents with   Epistaxis    [name] is a 72 y.o. female.  Patient is a 72 year old female who presents with a nosebleed.  She was here 1 week ago with a nosebleed.  At that time it was cauterized.  She had a little bit of a recurrence of the nosebleed 2 days ago which stopped after Afrin and pressure.  It started again this morning.  Been bleeding for about the last 2 hours.  She denies any bleeding in other places.  No easy bruising.  No recent trauma to her nose or history of prior significant nosebleeds.       Home Medications Prior to Admission medications   Medication Sig Start Date End Date Taking? Authorizing Provider  amoxicillin-clavulanate (AUGMENTIN) 875-125 MG tablet Take 1 tablet by mouth every 12 (twelve) hours. 11/09/22  Yes Rolan Bucco, MD  acetaminophen (TYLENOL) 500 MG tablet Take 1,000 mg by mouth every 6 (six) hours as needed for fever (fever).    [provider]  atorvastatin (LIPITOR) 20 MG tablet Take 20 mg by mouth daily. 07/09/22   [provider]  Calcium Carbonate (CALTRATE 600 PO) Take 1 tablet by mouth.    [provider]  cholecalciferol (VITAMIN D) 1000 UNITS tablet Take 2,000 Units by mouth daily.     [provider]  Cobalamin Combinations (NEURIVA PLUS PO) Take 1 tablet by mouth daily.    [provider]  conjugated estrogens (PREMARIN) vaginal cream Place 1/2 gram in the vagina two to three times weekly. 09/29/22   Amundson Shirley Friar, MD  GEMTESA 75 MG TABS Take 1 tablet by mouth daily. 06/25/22   [provider]  Multiple Vitamins-Minerals (CENTRUM SILVER PO) Take by mouth.    [provider]  omeprazole (PRILOSEC) 40 MG capsule Take 40 mg by mouth daily.    [provider]  Probiotic Product  (ULTRAFLORA IMMUNE HEALTH) 170 MG CAPS Take 1 capsule by mouth daily as needed (stomach issues).     [provider]  saccharomyces boulardii (FLORASTOR) 250 MG capsule Take 250 mg by mouth daily.    [provider]      Allergies    Sulfa antibiotics    Review of Systems   Review of Systems  Constitutional:  Negative for fatigue and fever.  HENT:  Positive for nosebleeds. Negative for congestion.   Respiratory:  Negative for shortness of breath.   Cardiovascular:  Negative for chest pain.  Gastrointestinal:  Negative for abdominal pain, nausea and vomiting.  Neurological:  Negative for headaches.    Physical Exam Updated Vital Signs BP (!) 182/78 (BP Location: Left Arm)   Pulse 77   Temp 98.2 F (36.8 C) (Oral)   Resp 17   Ht 5\' 2"  (1.575 m)   Wt 75.8 kg   LMP 06/15/2004 (Approximate)   SpO2 96%   BMI 30.54 kg/m  Physical Exam Constitutional:      Appearance: Normal appearance.  HENT:     Head: Normocephalic and atraumatic.     Nose:     Comments: Small trickle of blood from the right nare.   Eyes:     Extraocular Movements: Extraocular movements intact.     Pupils: Pupils are equal, round, and reactive to light.  Cardiovascular:  Rate and Rhythm: Normal rate.  Pulmonary:     Effort: Pulmonary effort is normal.     Breath sounds: Normal breath sounds.  Abdominal:     General: Abdomen is flat.     Palpations: Abdomen is soft.     Tenderness: There is no abdominal tenderness.  Skin:    General: Skin is warm and dry.  Neurological:     General: No focal deficit present.     Mental Status: She is alert.     ED Results / Procedures / Treatments   Labs (all labs ordered are listed, but only abnormal results are displayed) Labs Reviewed - No data to display  EKG None  Radiology No results found.  Procedures .Epistaxis Management  Date/Time: 11/09/2022 11:52 AM  Performed by: Rolan Bucco, MD Authorized by: Rolan Bucco, MD    Consent:    Consent obtained:  Verbal   Consent given by:  Patient   Risks, benefits, and alternatives were discussed: yes     Risks discussed:  Bleeding and pain   Alternatives discussed:  No treatment Anesthesia:    Anesthesia method:  Topical application   Topical anesthesia: cetacaine spray. Procedure details:    Treatment site:  R anterior   Treatment method:  Nasal balloon   Treatment complexity:  Limited   Treatment episode: initial   Post-procedure details:    Assessment:  Bleeding stopped   Procedure completion:  Tolerated well, no immediate complications     Medications Ordered in ED Medications - No data to display  ED Course/ Medical Decision Making/ A&P                             Medical Decision Making Risk Prescription drug management.   Patient is a 72 year old who presents with a nosebleed.  She recently had it cauterized.  It had restarted bleeding again.  At this point I went ahead and put nasal packing in.  She was monitored for several hours and had no further recurrence of the bleeding.  No bleeding in her posterior pharynx.  She was discharged home in good condition.  She was placed on Augmentin while she has the packing in place.  She was given a referral to follow-up with ENT within the next few days.  If she cannot follow-up with them in a few days, she was advised that she will need to have the packing removed at either her primary care doctor or urgent care.  Return precautions were given.  Final Clinical Impression(s) / ED Diagnoses Final diagnoses:  Right-sided epistaxis    Rx / DC Orders ED Discharge Orders          Ordered    amoxicillin-clavulanate (AUGMENTIN) 875-125 MG tablet  Every 12 hours        11/09/22 1438              Rolan Bucco, MD 11/09/22 1440

## 2022-11-12 ENCOUNTER — Emergency Department (HOSPITAL_COMMUNITY)
Admission: EM | Admit: 2022-11-12 | Discharge: 2022-11-12 | Disposition: A | Discharge status: Home / Self Care | Payer: Medicare HMO | Attending: Emergency Medicine | Admitting: Emergency Medicine

## 2022-11-12 ENCOUNTER — Encounter (HOSPITAL_COMMUNITY): Payer: Self-pay

## 2022-11-12 DIAGNOSIS — R040 Epistaxis: Secondary | ICD-10-CM | POA: Diagnosis not present

## 2022-11-12 NOTE — Discharge Instructions (Addendum)
Avoid bending over, lifting anything greater than 5 pounds, heavy physical activity, blowing the nose.

## 2022-11-12 NOTE — ED Triage Notes (Signed)
Pt has nosebleed, has had two other nosebleeds this week and on second visit there was a rhino rocket but is having continued bleeding. Also had previous attempts of cauterization on these past two visit .

## 2022-11-12 NOTE — Consult Note (Signed)
Reason for Consult: Epistaxis Referring Physician: Terald Sleeper, MD  [name] is an 72 y.o. female.  HPI: Recurrent right-sided epistaxis starting on Tuesday, has had multiple visits to the emergency department.  Had packing placed on Tuesday and had no bleeding until this morning.  She bled for about an hour.  She is not on anticoagulation.  She does not use any nasal sprays.  Past Medical History:  Diagnosis Date   Bladder tumor    BENIGN   Cancer (HCC)    Melanoma removed from left upper thigh--negative margins   Claustrophobia    Elevated liver function tests 2016   Gastritis determined by endoscopy 03/21/2017   Dr. Loreta Ave   GERD (gastroesophageal reflux disease)    Hiatal hernia 05/29/2015   CT scan of abdomen and pelvis   Osteopenia 2018   Sigmoid diverticulitis 05/29/2015   CT scan of abdomen and pelvis   Urinary urgency     Past Surgical History:  Procedure Laterality Date   ABDOMINAL HYSTERECTOMY     ABDOMINAL SACROCOLPOPEXY N/A 11/06/2013   Procedure: ABDOMINO SACROCOLPOPEXY with Halban's culdoplasty;  Surgeon: Jacqualin Combes de Gwenevere Ghazi, MD;  Location: [facility];  Service: Gynecology;  Laterality: N/A;   ANTERIOR AND POSTERIOR REPAIR N/A 11/06/2013   Procedure: ANTERIOR (CYSTOCELE) AND POSTERIOR REPAIR (RECTOCELE);  Surgeon: Jacqualin Combes de Gwenevere Ghazi, MD;  Location: [facility];  Service: Gynecology;  Laterality: N/A;   ANTERIOR CRUCIATE LIGAMENT REPAIR Right    BLADDER SUSPENSION N/A 11/06/2013   Procedure: TRANSVAGINAL TAPE (TVT) PROCEDURE exact midurethral sling;  Surgeon: Jacqualin Combes de Gwenevere Ghazi, MD;  Location: [facility];  Service: Gynecology;  Laterality: N/A;   BLADDER TUMOR EXCISION  1992   benign   CYSTOSCOPY N/A 11/06/2013   Procedure: CYSTOSCOPY;  Surgeon: Jacqualin Combes de Gwenevere Ghazi, MD;  Location: [facility];  Service: Gynecology;  Laterality: N/A;   HIATAL HERNIA REPAIR N/A 05/27/2014   Procedure: LAPAROSCOPIC  REPAIR OF HIATAL HERNIA WITH MESH;  Surgeon: Valarie Merino, MD;  Location: [facility];  Service: General;  Laterality: N/A;   INSERTION OF MESH  05/27/2014   Procedure: INSERTION OF MESH;  Surgeon: Valarie Merino, MD;  Location: [facility];  Service: General;;  two different mesh used for two different sites   SPIGELIAN HERNIA Right 05/27/2014   Procedure: RIGHT SPIGELIAN HERNIA REPAIR WITH MESH;  Surgeon: Valarie Merino, MD;  Location: [facility];  Service: General;  Laterality: Right;   TOTAL ABDOMINAL HYSTERECTOMY W/ BILATERAL SALPINGOOPHORECTOMY  06/15/04   dermoid and AUB    Family History  Problem Relation Age of Onset   Sudden Cardiac Death Father 51       dued in his sleep, unkown cause   Cancer Maternal Grandfather     Social History:  reports that she quit smoking about 49 years ago. Her smoking use included cigarettes. She has never used smokeless tobacco. She reports current alcohol use of about 14.0 standard drinks of alcohol per week. She reports that she does not use drugs.  Allergies:  Allergies  Allergen Reactions   Sulfa Antibiotics Hives    Medications: Reviewed  No results found for this or any previous visit (from the past 48 hour(s)).  No results found.  [id number] except as listed in admit H&P  Blood pressure 131/66, pulse 74, temperature 98.1 F (36.7 C), resp. rate 15, last menstrual period 06/15/2004, SpO2 96 %.  PHYSICAL EXAM: Overall  appearance:  Healthy appearing, in no distress Head:  Normocephalic, atraumatic. Ears: External ears look healthy. Nose: External nose is healthy in appearance. Internal nasal exam free of any lesions or obstruction on the left.  Right side with inflatable packing.  The packing was removed.  Bleeding site was immediately identified in the right anterior septum.  There is brisk bleeding.  This was topically anesthetized with Xylocaine/Afrin solution on cotton pledgets and then extensively cauterized with silver nitrate.  There  is no further bleeding.  A small Merisel pack was cut to about 2-1/2 cm and placed into the right nasal cavity, inflated with Afrin solution. Oral Cavity/Pharynx:  There are no mucosal lesions or masses identified. Larynx/Hypopharynx: Deferred Neuro:  No identifiable neurologic deficits. Neck: No palpable neck masses.  Studies Reviewed: none  Procedures: Treatment of epistaxis, see above note.   Assessment/Plan: Recurrent anterior epistaxis on the right, bleeding site identified and cauterized.  Small packing placed.  Avoid heavy activity.  Follow-up in our office in 5 days for packing removal or contact us sooner if there is any additional bleeding.   R04.0  Medical Decision Making: #/Complex Problems: 3  Data Reviewed:1  Management:3 (1-Straightforward, 2-Low, 3-Moderate, 4-High)   [name] 11/12/2022, 9:51 AM

## 2022-11-12 NOTE — ED Provider Notes (Signed)
[place] EMERGENCY DEPARTMENT AT [facility] Provider Note   CSN: [id number] Arrival date & time: 11/12/22  8295     History  Chief Complaint  Patient presents with   Epistaxis    Cinthia Selmer is a 72 y.o. female. Patient presents to the ED with right sided epistasis that started 1 hour ago. Patient has a history of 2 ED visits for the same concern on 06/15 and 06/25. Patient states cauterization was done at the first visit and pt currently has a rhino-rocket which was placed on 06/25. Patient states she tried using Afrin and pinching her nose with no relief this morning. Patient says she has an appointment with [facility]NT on Monday. Patient denies N/V, use of blood thinners or history of HTN. Patient denies any recent injury or trauma.   HPI     Home Medications Prior to Admission medications   Medication Sig Start Date End Date Taking? Authorizing Provider  acetaminophen (TYLENOL) 500 MG tablet Take 1,000 mg by mouth every 6 (six) hours as needed for fever (fever).    [provider]  amoxicillin-clavulanate (AUGMENTIN) 875-125 MG tablet Take 1 tablet by mouth every 12 (twelve) hours. 11/09/22   Rolan Bucco, MD  atorvastatin (LIPITOR) 20 MG tablet Take 20 mg by mouth daily. 07/09/22   [provider]  Calcium Carbonate (CALTRATE 600 PO) Take 1 tablet by mouth.    [provider]  cholecalciferol (VITAMIN D) 1000 UNITS tablet Take 2,000 Units by mouth daily.     [provider]  Cobalamin Combinations (NEURIVA PLUS PO) Take 1 tablet by mouth daily.    [provider]  conjugated estrogens (PREMARIN) vaginal cream Place 1/2 gram in the vagina two to three times weekly. 09/29/22   Amundson Shirley Friar, MD  GEMTESA 75 MG TABS Take 1 tablet by mouth daily. 06/25/22   [provider]  Multiple Vitamins-Minerals (CENTRUM SILVER PO) Take by mouth.    [provider]  omeprazole (PRILOSEC) 40 MG  capsule Take 40 mg by mouth daily.    [provider]  Probiotic Product (ULTRAFLORA IMMUNE HEALTH) 170 MG CAPS Take 1 capsule by mouth daily as needed (stomach issues).     [provider]  saccharomyces boulardii (FLORASTOR) 250 MG capsule Take 250 mg by mouth daily.    [provider]      Allergies    Sulfa antibiotics    Review of Systems   Review of Systems  Physical Exam Updated Vital Signs BP 131/66   Pulse 74   Temp 98.1 F (36.7 C)   Resp 15   LMP 06/15/2004 (Approximate)   SpO2 96%  Physical Exam Vitals and nursing note reviewed.  HENT:     Head: Normocephalic and atraumatic.     Nose:     Right Nostril: Epistaxis present.     Comments: Rapid Rhino in place in right nare, blood seeping around packing Eyes:     Conjunctiva/sclera: Conjunctivae normal.  Pulmonary:     Effort: Pulmonary effort is normal. No respiratory distress.  Musculoskeletal:        General: No signs of injury.     Cervical back: Normal range of motion.  Skin:    General: Skin is dry.  Neurological:     Mental Status: She is alert.  Psychiatric:        Speech: Speech normal.        Behavior: Behavior normal.  ED Results / Procedures / Treatments   Labs (all labs ordered are listed, but only abnormal results are displayed) Labs Reviewed - No data to display  EKG None  Radiology No results found.  Procedures Procedures    Medications Ordered in ED Medications - No data to display  ED Course/ Medical Decision Making/ A&P                             Medical Decision Making  This patient presents to the ED for concern of epistaxis, this involves an extensive number of treatment options, and is a complaint that carries with it a high risk of complications and morbidity.     Co morbidities that complicate the patient evaluation  History of previous visits for the same   Additional history obtained:  Additional history obtained from  visitor at bedside External records from outside source obtained and reviewed including ED notes from the past 2 Tuesdays   Consultations Obtained:  I requested consultation with the on call ENT, Dr.Rosen,  and discussed lab and imaging findings as well as pertinent plan - they recommend: consulted at bedside, cauterized wound, packed nare, follow up in 5 days in office   Test / Admission - Considered:  Patient with multiple emergency department visits for recurrent epistaxis.  Patient was evaluated at bedside by ENT who cauterized her right nare and packed the nostril.  Plan to have patient follow-up outpatient with ENT in 5 days for packing removal.         Final Clinical Impression(s) / ED Diagnoses Final diagnoses:  Epistaxis    Rx / DC Orders ED Discharge Orders     None         Pamala Duffel 11/12/22 8119    Terald Sleeper, MD 11/12/22 [phone]

## 2022-11-17 DIAGNOSIS — R040 Epistaxis: Secondary | ICD-10-CM | POA: Diagnosis not present

## 2022-11-17 DIAGNOSIS — Z4800 Encounter for change or removal of nonsurgical wound dressing: Secondary | ICD-10-CM | POA: Diagnosis not present

## 2022-11-25 DIAGNOSIS — M62838 Other muscle spasm: Secondary | ICD-10-CM | POA: Diagnosis not present

## 2022-11-25 DIAGNOSIS — M6281 Muscle weakness (generalized): Secondary | ICD-10-CM | POA: Diagnosis not present

## 2022-11-25 DIAGNOSIS — M6289 Other specified disorders of muscle: Secondary | ICD-10-CM | POA: Diagnosis not present

## 2022-11-25 DIAGNOSIS — R3915 Urgency of urination: Secondary | ICD-10-CM | POA: Diagnosis not present

## 2023-01-27 DIAGNOSIS — R3915 Urgency of urination: Secondary | ICD-10-CM | POA: Diagnosis not present

## 2023-01-27 DIAGNOSIS — M6281 Muscle weakness (generalized): Secondary | ICD-10-CM | POA: Diagnosis not present

## 2023-01-27 DIAGNOSIS — M6289 Other specified disorders of muscle: Secondary | ICD-10-CM | POA: Diagnosis not present

## 2023-01-27 DIAGNOSIS — M62838 Other muscle spasm: Secondary | ICD-10-CM | POA: Diagnosis not present

## 2023-02-14 DIAGNOSIS — M6281 Muscle weakness (generalized): Secondary | ICD-10-CM | POA: Diagnosis not present

## 2023-02-14 DIAGNOSIS — M62838 Other muscle spasm: Secondary | ICD-10-CM | POA: Diagnosis not present

## 2023-02-14 DIAGNOSIS — M6289 Other specified disorders of muscle: Secondary | ICD-10-CM | POA: Diagnosis not present

## 2023-02-14 DIAGNOSIS — R3915 Urgency of urination: Secondary | ICD-10-CM | POA: Diagnosis not present

## 2023-03-21 DIAGNOSIS — M6289 Other specified disorders of muscle: Secondary | ICD-10-CM | POA: Diagnosis not present

## 2023-03-21 DIAGNOSIS — M62838 Other muscle spasm: Secondary | ICD-10-CM | POA: Diagnosis not present

## 2023-03-21 DIAGNOSIS — N3941 Urge incontinence: Secondary | ICD-10-CM | POA: Diagnosis not present

## 2023-03-21 DIAGNOSIS — M6281 Muscle weakness (generalized): Secondary | ICD-10-CM | POA: Diagnosis not present

## 2023-03-21 DIAGNOSIS — R350 Frequency of micturition: Secondary | ICD-10-CM | POA: Diagnosis not present

## 2023-04-21 DIAGNOSIS — R3915 Urgency of urination: Secondary | ICD-10-CM | POA: Diagnosis not present

## 2023-04-21 DIAGNOSIS — M62838 Other muscle spasm: Secondary | ICD-10-CM | POA: Diagnosis not present

## 2023-04-21 DIAGNOSIS — M6289 Other specified disorders of muscle: Secondary | ICD-10-CM | POA: Diagnosis not present

## 2023-04-21 DIAGNOSIS — M6281 Muscle weakness (generalized): Secondary | ICD-10-CM | POA: Diagnosis not present

## 2023-06-22 ENCOUNTER — Other Ambulatory Visit: Payer: Self-pay | Admitting: Obstetrics and Gynecology

## 2023-06-22 ENCOUNTER — Ambulatory Visit
Admission: RE | Admit: 2023-06-22 | Discharge: 2023-06-22 | Disposition: A | Discharge status: Home / Self Care | Payer: PPO | Source: Ambulatory Visit | Attending: Obstetrics and Gynecology | Admitting: Obstetrics and Gynecology

## 2023-06-22 DIAGNOSIS — Z1231 Encounter for screening mammogram for malignant neoplasm of breast: Secondary | ICD-10-CM

## 2023-06-26 ENCOUNTER — Encounter: Payer: Self-pay | Admitting: Obstetrics and Gynecology

## 2023-06-27 ENCOUNTER — Other Ambulatory Visit: Payer: Self-pay | Admitting: Obstetrics and Gynecology

## 2023-06-27 DIAGNOSIS — R928 Other abnormal and inconclusive findings on diagnostic imaging of breast: Secondary | ICD-10-CM

## 2023-07-04 ENCOUNTER — Encounter: Payer: Self-pay | Admitting: Obstetrics and Gynecology

## 2023-07-04 ENCOUNTER — Ambulatory Visit: Payer: PPO

## 2023-07-04 ENCOUNTER — Ambulatory Visit
Admission: RE | Admit: 2023-07-04 | Discharge: 2023-07-04 | Disposition: A | Discharge status: Home / Self Care | Payer: PPO | Source: Ambulatory Visit | Attending: Obstetrics and Gynecology | Admitting: Obstetrics and Gynecology

## 2023-07-04 DIAGNOSIS — R928 Other abnormal and inconclusive findings on diagnostic imaging of breast: Secondary | ICD-10-CM

## 2023-07-06 ENCOUNTER — Other Ambulatory Visit: Payer: PPO

## 2023-09-27 DIAGNOSIS — C4441 Basal cell carcinoma of skin of scalp and neck: Secondary | ICD-10-CM | POA: Diagnosis not present

## 2023-09-27 DIAGNOSIS — Z85828 Personal history of other malignant neoplasm of skin: Secondary | ICD-10-CM | POA: Diagnosis not present

## 2023-09-27 DIAGNOSIS — L821 Other seborrheic keratosis: Secondary | ICD-10-CM | POA: Diagnosis not present

## 2023-09-27 DIAGNOSIS — L570 Actinic keratosis: Secondary | ICD-10-CM | POA: Diagnosis not present

## 2023-09-27 DIAGNOSIS — C44319 Basal cell carcinoma of skin of other parts of face: Secondary | ICD-10-CM | POA: Diagnosis not present

## 2023-09-27 DIAGNOSIS — D225 Melanocytic nevi of trunk: Secondary | ICD-10-CM | POA: Diagnosis not present

## 2023-09-27 DIAGNOSIS — D1801 Hemangioma of skin and subcutaneous tissue: Secondary | ICD-10-CM | POA: Diagnosis not present

## 2023-09-27 DIAGNOSIS — Z85820 Personal history of malignant melanoma of skin: Secondary | ICD-10-CM | POA: Diagnosis not present

## 2023-09-27 DIAGNOSIS — D235 Other benign neoplasm of skin of trunk: Secondary | ICD-10-CM | POA: Diagnosis not present

## 2023-09-27 DIAGNOSIS — I788 Other diseases of capillaries: Secondary | ICD-10-CM | POA: Diagnosis not present

## 2023-09-27 DIAGNOSIS — L814 Other melanin hyperpigmentation: Secondary | ICD-10-CM | POA: Diagnosis not present

## 2023-09-28 DIAGNOSIS — M8580 Other specified disorders of bone density and structure, unspecified site: Secondary | ICD-10-CM | POA: Diagnosis not present

## 2023-09-28 DIAGNOSIS — K219 Gastro-esophageal reflux disease without esophagitis: Secondary | ICD-10-CM | POA: Diagnosis not present

## 2023-09-28 DIAGNOSIS — E7800 Pure hypercholesterolemia, unspecified: Secondary | ICD-10-CM | POA: Diagnosis not present

## 2023-09-29 NOTE — Progress Notes (Signed)
 73 y.o. [id number] Married Caucasian female here for a breast and pelvic exam.    The patient is also followed for vaginal atrophy and is using Premarin  cream twice a week.   Taking Gemtesa through Dr. MacDiarmid.   Did pelvic floor therapy and has a good response if she does her exercises at home.  She completed her sessions at [facility].  Constipation improved since increasing fiber in her diet.   Joined [employer] and has lost 6 pounds already.   PCP: Benedetto Brady, MD   Patient's last menstrual period was 06/15/2004 (approximate).           Sexually active: Yes.    The current method of family planning is vasectomy.    Menopausal hormone therapy:  Premarin  Exercising: Yes.    Walking and tennis  Smoker:  Former  OB History     Gravida  3   Para  3   Term  3   Preterm      AB      Living  3      SAB      IAB      Ectopic      Multiple      Live Births              HEALTH MAINTENANCE: Last 2 paps: 2007 History of abnormal Pap or positive HPV:  no Mammogram:  06/22/23 Breast density Cat B, incomplete, 07/04/23 Left Breast density Cat B, BBIRADS Cat 1 neg Colonoscopy:  08/24/05 Bone Density:  11/18/21  Result  osteopenia of hip.  PCP following.    Immunization History  Administered Date(s) Administered   Influenza Split 03/17/2020   Influenza, High Dose Seasonal PF 03/02/2018   Influenza-Unspecified 03/15/2016, 02/15/2019, 03/02/2021   PFIZER(Purple Top)SARS-COV-2 Vaccination 06/14/2019, 07/08/2019, 02/08/2020   PPD Test 01/02/2008   Pfizer Covid-19 Vaccine Bivalent Booster 11yrs & up 04/18/2021   Pneumococcal Polysaccharide-23 02/15/2019   Tdap 10/09/2013   Zoster, Live 11/20/2014, 09/01/2020, 11/03/2020      reports that she quit smoking about 50 years ago. Her smoking use included cigarettes. She has never used smokeless tobacco. She reports current alcohol use of about 14.0 standard drinks of alcohol per week. She reports that she  does not use drugs.  Past Medical History:  Diagnosis Date   Bladder tumor    BENIGN   Cancer (HCC)    Melanoma removed from left upper thigh--negative margins   Claustrophobia    Elevated liver function tests 2016   Gastritis determined by endoscopy 03/21/2017   Dr. Tova Fresh   GERD (gastroesophageal reflux disease)    Hiatal hernia 05/29/2015   CT scan of abdomen and pelvis   Osteopenia 2018   Sigmoid diverticulitis 05/29/2015   CT scan of abdomen and pelvis   Urinary urgency     Past Surgical History:  Procedure Laterality Date   ABDOMINAL HYSTERECTOMY     ABDOMINAL SACROCOLPOPEXY N/A 11/06/2013   Procedure: ABDOMINO SACROCOLPOPEXY with Halban's culdoplasty;  Surgeon: Iva Mariner de Epifania Haskell, MD;  Location: [facility];  Service: Gynecology;  Laterality: N/A;   ANTERIOR AND POSTERIOR REPAIR N/A 11/06/2013   Procedure: ANTERIOR (CYSTOCELE) AND POSTERIOR REPAIR (RECTOCELE);  Surgeon: Iva Mariner de Epifania Haskell, MD;  Location: [facility];  Service: Gynecology;  Laterality: N/A;   ANTERIOR CRUCIATE LIGAMENT REPAIR Right    BLADDER SUSPENSION N/A 11/06/2013   Procedure: TRANSVAGINAL TAPE (TVT) PROCEDURE exact midurethral sling;  Surgeon: Iva Mariner  de Epifania Haskell, MD;  Location: [facility];  Service: Gynecology;  Laterality: N/A;   BLADDER TUMOR EXCISION  1992   benign   CYSTOSCOPY N/A 11/06/2013   Procedure: CYSTOSCOPY;  Surgeon: Iva Mariner de Epifania Haskell, MD;  Location: [facility];  Service: Gynecology;  Laterality: N/A;   HIATAL HERNIA REPAIR N/A 05/27/2014   Procedure: LAPAROSCOPIC REPAIR OF HIATAL HERNIA WITH MESH;  Surgeon: Azucena Bollard, MD;  Location: [facility];  Service: General;  Laterality: N/A;   INSERTION OF MESH  05/27/2014   Procedure: INSERTION OF MESH;  Surgeon: Azucena Bollard, MD;  Location: [facility];  Service: General;;  two different mesh used for two different sites   SPIGELIAN HERNIA Right 05/27/2014   Procedure: RIGHT SPIGELIAN HERNIA  REPAIR WITH MESH;  Surgeon: Azucena Bollard, MD;  Location: [facility];  Service: General;  Laterality: Right;   TOTAL ABDOMINAL HYSTERECTOMY W/ BILATERAL SALPINGOOPHORECTOMY  06/15/04   dermoid and AUB    Current Outpatient Medications  Medication Sig Dispense Refill   acetaminophen  (TYLENOL ) 500 MG tablet Take 1,000 mg by mouth every 6 (six) hours as needed for fever (fever).     atorvastatin (LIPITOR) 20 MG tablet Take 20 mg by mouth daily.     Calcium Carbonate (CALTRATE 600 PO) Take 1 tablet by mouth.     cholecalciferol (VITAMIN D ) 1000 UNITS tablet Take 2,000 Units by mouth daily.      Cobalamin Combinations (NEURIVA PLUS PO) Take 1 tablet by mouth daily.     conjugated estrogens  (PREMARIN ) vaginal cream Place 1/2 gram in the vagina two to three times weekly. 30 g 2   GEMTESA 75 MG TABS Take 1 tablet by mouth daily.     Multiple Vitamins-Minerals (CENTRUM SILVER  PO) Take by mouth.     omeprazole (PRILOSEC) 40 MG capsule Take 40 mg by mouth daily.     amoxicillin -clavulanate (AUGMENTIN ) 875-125 MG tablet Take 1 tablet by mouth every 12 (twelve) hours. (Patient not taking: Reported on 10/03/2023) 6 tablet 0   Probiotic Product (ULTRAFLORA IMMUNE HEALTH) 170 MG CAPS Take 1 capsule by mouth daily as needed (stomach issues).  (Patient not taking: Reported on 10/03/2023)     saccharomyces boulardii (FLORASTOR) 250 MG capsule Take 250 mg by mouth daily. (Patient not taking: Reported on 10/03/2023)     No current facility-administered medications for this visit.    ALLERGIES: Sulfa antibiotics  Family History  Problem Relation Age of Onset   Sudden Cardiac Death Father 33       dued in his sleep, unkown cause   Cancer Maternal Grandfather    BRCA 1/2 Neg Hx    Breast cancer Neg Hx     Review of Systems  All other systems reviewed and are negative.   PHYSICAL EXAM:  BP 126/86 (BP Location: Left Arm, Patient Position: Sitting)   Pulse 71   Ht 5\' 3"  (1.6 m)   Wt 168 lb (76.2 kg)   LMP  06/15/2004 (Approximate)   SpO2 98%   BMI 29.76 kg/m     General appearance: alert, cooperative and appears stated age Head: normocephalic, without obvious abnormality, atraumatic Neck: no adenopathy, supple, symmetrical, trachea midline and thyroid  normal to inspection and palpation Lungs: clear to auscultation bilaterally Breasts: normal appearance, no masses or tenderness, bilateral nipple inversion, No nipple discharge or bleeding, No axillary adenopathy Heart: regular rate and rhythm Abdomen: soft, non-tender; no masses, no organomegaly Extremities: extremities normal, atraumatic, no cyanosis or  edema Skin: skin color, texture, turgor normal. No rashes or lesions Lymph nodes: cervical, supraclavicular, and axillary nodes normal. Neurologic: grossly normal  Pelvic: External genitalia:  no lesions              No abnormal inguinal nodes palpated.              Urethra:  normal appearing urethra with no masses, tenderness or lesions              Bartholins and Skenes: normal                 Vagina: normal appearing vagina with normal color and discharge, no lesions.  First degree rectocele.               Cervix: absent              Pap taken: no Bimanual Exam:  Uterus:  absent              Adnexa: no mass, fullness, tenderness              Rectal exam: yes.  Confirms.              Anus:  normal sphincter tone, no lesions  Chaperone was present for exam:  Cottie Diss, CMA  ASSESSMENT: Encounter for breast and pelvic exam.  Status post TAH/BSO.  Status post abdominosacrocolpopexy, anterior and posterior colporrhaphy, TVT midurethral sling.  Current rectocele.  Status post hiatal hernia repair and abdominal wall hernia repair.  Hx vaginal atrophy treated with local vaginal estrogen.  Medication monitoring visit.  Overactive bladder. On Gemtesa. Elevated blood pressure.  Bilateral nipple inversion.  Osteopenia.    PLAN: Mammogram screening discussed. Self breast awareness  reviewed. Pap and HRV collected:  no.  Not indicated.  Guidelines for Calcium, Vitamin D , regular exercise program including cardiovascular and weight bearing exercise. Avoid straining for BMs.  We discussed fiber, increased water , stool softeners, Miralax and Metamucil as options for treating constipation and reducing risk of progression of the rectocele.  Medication refills:  Premarin  vaginal estrogen cream. 1/2 gm pv at hs 2 - 3 times per week.  [facility]  She knows that they are scheduling into 2026.  This should be ok because she has osteopenia and is not taking prescription medication.  Follow up:  yearly and prn.    Additional counseling given.  yes. 25 min  total time was spent for this patient encounter, including preparation, face-to-face counseling with the patient, coordination of care, and documentation of the encounter in addition to doing the breast and pelvic exam.

## 2023-09-30 DIAGNOSIS — Z0000 Encounter for general adult medical examination without abnormal findings: Secondary | ICD-10-CM | POA: Diagnosis not present

## 2023-09-30 DIAGNOSIS — Z23 Encounter for immunization: Secondary | ICD-10-CM | POA: Diagnosis not present

## 2023-09-30 DIAGNOSIS — E7800 Pure hypercholesterolemia, unspecified: Secondary | ICD-10-CM | POA: Diagnosis not present

## 2023-09-30 DIAGNOSIS — M8588 Other specified disorders of bone density and structure, other site: Secondary | ICD-10-CM | POA: Diagnosis not present

## 2023-09-30 DIAGNOSIS — K219 Gastro-esophageal reflux disease without esophagitis: Secondary | ICD-10-CM | POA: Diagnosis not present

## 2023-10-03 ENCOUNTER — Ambulatory Visit (INDEPENDENT_AMBULATORY_CARE_PROVIDER_SITE_OTHER): Payer: Medicare HMO | Admitting: Obstetrics and Gynecology

## 2023-10-03 ENCOUNTER — Encounter: Payer: Self-pay | Admitting: Obstetrics and Gynecology

## 2023-10-03 VITALS — BP 126/86 | HR 71 | Ht 63.0 in | Wt 168.0 lb

## 2023-10-03 DIAGNOSIS — N816 Rectocele: Secondary | ICD-10-CM

## 2023-10-03 DIAGNOSIS — N952 Postmenopausal atrophic vaginitis: Secondary | ICD-10-CM

## 2023-10-03 DIAGNOSIS — Z01419 Encounter for gynecological examination (general) (routine) without abnormal findings: Secondary | ICD-10-CM

## 2023-10-03 DIAGNOSIS — Z5181 Encounter for therapeutic drug level monitoring: Secondary | ICD-10-CM | POA: Diagnosis not present

## 2023-10-03 MED ORDER — PREMARIN 0.625 MG/GM VA CREA: TOPICAL_CREAM | VAGINAL | 2 refills | Status: AC

## 2023-10-03 NOTE — Patient Instructions (Addendum)

## 2023-10-13 DIAGNOSIS — K429 Umbilical hernia without obstruction or gangrene: Secondary | ICD-10-CM | POA: Diagnosis not present

## 2023-10-13 DIAGNOSIS — K5909 Other constipation: Secondary | ICD-10-CM | POA: Diagnosis not present

## 2023-10-13 DIAGNOSIS — K5730 Diverticulosis of large intestine without perforation or abscess without bleeding: Secondary | ICD-10-CM | POA: Diagnosis not present

## 2023-10-13 DIAGNOSIS — K219 Gastro-esophageal reflux disease without esophagitis: Secondary | ICD-10-CM | POA: Diagnosis not present

## 2023-11-08 ENCOUNTER — Other Ambulatory Visit (HOSPITAL_BASED_OUTPATIENT_CLINIC_OR_DEPARTMENT_OTHER): Payer: Self-pay | Admitting: Family Medicine

## 2023-11-08 DIAGNOSIS — M8588 Other specified disorders of bone density and structure, other site: Secondary | ICD-10-CM

## 2023-11-09 DIAGNOSIS — C44319 Basal cell carcinoma of skin of other parts of face: Secondary | ICD-10-CM | POA: Diagnosis not present

## 2023-11-09 DIAGNOSIS — Z85828 Personal history of other malignant neoplasm of skin: Secondary | ICD-10-CM | POA: Diagnosis not present

## 2023-11-29 DIAGNOSIS — L0889 Other specified local infections of the skin and subcutaneous tissue: Secondary | ICD-10-CM | POA: Diagnosis not present

## 2024-05-28 ENCOUNTER — Other Ambulatory Visit: Payer: Self-pay | Admitting: Obstetrics and Gynecology

## 2024-05-28 DIAGNOSIS — Z1231 Encounter for screening mammogram for malignant neoplasm of breast: Secondary | ICD-10-CM

## 2024-06-22 ENCOUNTER — Ambulatory Visit

## 2024-06-27 ENCOUNTER — Ambulatory Visit

## 2024-08-29 ENCOUNTER — Ambulatory Visit (HOSPITAL_BASED_OUTPATIENT_CLINIC_OR_DEPARTMENT_OTHER)

## 2024-10-03 ENCOUNTER — Ambulatory Visit: Admitting: Obstetrics and Gynecology
